# Patient Record
Sex: Female | Born: 1935 | Race: White | Hispanic: No | Smoking: Former smoker
Health system: Southern US, Community
[De-identification: ages and names within clinical notes are randomized; demographics above are authoritative.]

## PROBLEM LIST (undated history)

## (undated) DIAGNOSIS — I2510 Atherosclerotic heart disease of native coronary artery without angina pectoris: Secondary | ICD-10-CM

## (undated) DIAGNOSIS — E119 Type 2 diabetes mellitus without complications: Secondary | ICD-10-CM

## (undated) DIAGNOSIS — I10 Essential (primary) hypertension: Secondary | ICD-10-CM

## (undated) DIAGNOSIS — I4891 Unspecified atrial fibrillation: Secondary | ICD-10-CM

## (undated) HISTORY — PX: OTHER SURGICAL HISTORY: SHX169

## (undated) HISTORY — PX: ABDOMINAL HYSTERECTOMY: SHX81

## (undated) HISTORY — PX: APPENDECTOMY: SHX54

---

## 2020-11-03 ENCOUNTER — Emergency Department: Payer: Medicare Other

## 2020-11-03 ENCOUNTER — Inpatient Hospital Stay
Admission: EM | Admit: 2020-11-03 | Discharge: 2020-11-05 | DRG: 309 | Disposition: A | Discharge status: Home / Self Care | Payer: Medicare Other | Attending: Internal Medicine | Admitting: Internal Medicine

## 2020-11-03 DIAGNOSIS — E785 Hyperlipidemia, unspecified: Secondary | ICD-10-CM

## 2020-11-03 DIAGNOSIS — Z79899 Other long term (current) drug therapy: Secondary | ICD-10-CM

## 2020-11-03 DIAGNOSIS — I2510 Atherosclerotic heart disease of native coronary artery without angina pectoris: Secondary | ICD-10-CM

## 2020-11-03 DIAGNOSIS — Z87891 Personal history of nicotine dependence: Secondary | ICD-10-CM

## 2020-11-03 DIAGNOSIS — Z23 Encounter for immunization: Secondary | ICD-10-CM

## 2020-11-03 DIAGNOSIS — I4891 Unspecified atrial fibrillation: Principal | ICD-10-CM | POA: Diagnosis present

## 2020-11-03 DIAGNOSIS — I480 Paroxysmal atrial fibrillation: Principal | ICD-10-CM | POA: Diagnosis present

## 2020-11-03 DIAGNOSIS — I10 Essential (primary) hypertension: Secondary | ICD-10-CM | POA: Diagnosis not present

## 2020-11-03 DIAGNOSIS — R8281 Pyuria: Secondary | ICD-10-CM | POA: Diagnosis present

## 2020-11-03 DIAGNOSIS — I959 Hypotension, unspecified: Secondary | ICD-10-CM | POA: Diagnosis not present

## 2020-11-03 DIAGNOSIS — E119 Type 2 diabetes mellitus without complications: Secondary | ICD-10-CM | POA: Diagnosis present

## 2020-11-03 DIAGNOSIS — Z20822 Contact with and (suspected) exposure to covid-19: Secondary | ICD-10-CM | POA: Diagnosis present

## 2020-11-03 DIAGNOSIS — E039 Hypothyroidism, unspecified: Secondary | ICD-10-CM | POA: Diagnosis present

## 2020-11-03 DIAGNOSIS — N390 Urinary tract infection, site not specified: Secondary | ICD-10-CM | POA: Diagnosis present

## 2020-11-03 DIAGNOSIS — E782 Mixed hyperlipidemia: Secondary | ICD-10-CM

## 2020-11-03 DIAGNOSIS — Z66 Do not resuscitate: Secondary | ICD-10-CM | POA: Diagnosis present

## 2020-11-03 DIAGNOSIS — Z87440 Personal history of urinary (tract) infections: Secondary | ICD-10-CM

## 2020-11-03 HISTORY — DX: Essential (primary) hypertension: I10

## 2020-11-03 HISTORY — DX: Type 2 diabetes mellitus without complications: E11.9

## 2020-11-03 HISTORY — DX: Atherosclerotic heart disease of native coronary artery without angina pectoris: I25.10

## 2020-11-03 HISTORY — DX: Unspecified atrial fibrillation: I48.91

## 2020-11-03 LAB — URINALYSIS, COMPLETE (UACMP) WITH MICROSCOPIC
Bilirubin Urine: NEGATIVE
Glucose, UA: NEGATIVE mg/dL
Hgb urine dipstick: NEGATIVE
Ketones, ur: NEGATIVE mg/dL
Nitrite: NEGATIVE
Protein, ur: NEGATIVE mg/dL
Specific Gravity, Urine: 1.011 (ref 1.005–1.030)
pH: 7 (ref 5.0–8.0)

## 2020-11-03 LAB — COMPREHENSIVE METABOLIC PANEL
ALT: 14 U/L (ref 0–44)
AST: 17 U/L (ref 15–41)
Albumin: 3.7 g/dL (ref 3.5–5.0)
Alkaline Phosphatase: 73 U/L (ref 38–126)
Anion gap: 10 (ref 5–15)
BUN: 25 mg/dL — ABNORMAL HIGH (ref 8–23)
CO2: 26 mmol/L (ref 22–32)
Calcium: 9.7 mg/dL (ref 8.9–10.3)
Chloride: 100 mmol/L (ref 98–111)
Creatinine, Ser: 0.97 mg/dL (ref 0.44–1.00)
GFR, Estimated: 55 mL/min — ABNORMAL LOW (ref >60)
Glucose, Bld: 102 mg/dL — ABNORMAL HIGH (ref 70–99)
Potassium: 3.8 mmol/L (ref 3.5–5.1)
Sodium: 136 mmol/L (ref 135–145)
Total Bilirubin: 1 mg/dL (ref 0.3–1.2)
Total Protein: 6.4 g/dL — ABNORMAL LOW (ref 6.5–8.1)

## 2020-11-03 LAB — CBC WITH DIFFERENTIAL/PLATELET
Abs Immature Granulocytes: 0.03 10*3/uL (ref 0.00–0.07)
Basophils Absolute: 0.1 10*3/uL (ref 0.0–0.1)
Basophils Relative: 1 %
Eosinophils Absolute: 0.2 10*3/uL (ref 0.0–0.5)
Eosinophils Relative: 2 %
HCT: 43.3 % (ref 36.0–46.0)
Hemoglobin: 14.1 g/dL (ref 12.0–15.0)
Immature Granulocytes: 0 %
Lymphocytes Relative: 25 %
Lymphs Abs: 2.6 10*3/uL (ref 0.7–4.0)
MCH: 31.8 pg (ref 26.0–34.0)
MCHC: 32.6 g/dL (ref 30.0–36.0)
MCV: 97.5 fL (ref 80.0–100.0)
Monocytes Absolute: 0.8 10*3/uL (ref 0.1–1.0)
Monocytes Relative: 7 %
Neutro Abs: 6.6 10*3/uL (ref 1.7–7.7)
Neutrophils Relative %: 65 %
Platelets: 202 10*3/uL (ref 150–400)
RBC: 4.44 MIL/uL (ref 3.87–5.11)
RDW: 12.5 % (ref 11.5–15.5)
WBC: 10.3 10*3/uL (ref 4.0–10.5)
nRBC: 0 % (ref 0.0–0.2)

## 2020-11-03 LAB — TROPONIN I (HIGH SENSITIVITY)
Troponin I (High Sensitivity): 8 ng/L (ref <18)
Troponin I (High Sensitivity): 7 ng/L (ref <18)

## 2020-11-03 LAB — RESP PANEL BY RT-PCR (FLU A&B, COVID) ARPGX2
Influenza A by PCR: NEGATIVE
Influenza B by PCR: NEGATIVE
SARS Coronavirus 2 by RT PCR: NEGATIVE

## 2020-11-03 LAB — TSH: TSH: 0.121 u[IU]/mL — ABNORMAL LOW (ref 0.350–4.500)

## 2020-11-03 LAB — PROCALCITONIN: Procalcitonin: <0.1 ng/mL

## 2020-11-03 LAB — BRAIN NATRIURETIC PEPTIDE: B Natriuretic Peptide: 341.8 pg/mL — ABNORMAL HIGH (ref 0.0–100.0)

## 2020-11-03 MED ORDER — ALBUTEROL SULFATE (2.5 MG/3ML) 0.083% IN NEBU: 2.5000 mg | INHALATION_SOLUTION | Freq: Four times a day (QID) | RESPIRATORY_TRACT | Status: DC | PRN

## 2020-11-03 MED ORDER — DILTIAZEM HCL-DEXTROSE 125-5 MG/125ML-% IV SOLN (PREMIX)
5.0000 mg/h | INTRAVENOUS | Status: DC
  Administered 2020-11-03 – 2020-11-04 (×2): 5 mg/h via INTRAVENOUS
  Administered 2020-11-04: 10 mg/h via INTRAVENOUS
  Filled 2020-11-03 (×2): qty 125

## 2020-11-03 MED ORDER — METOPROLOL TARTRATE 5 MG/5ML IV SOLN: 5.0000 mg | INTRAVENOUS | Status: DC | PRN

## 2020-11-03 MED ORDER — METOPROLOL TARTRATE 5 MG/5ML IV SOLN
5.0000 mg | Freq: Once | INTRAVENOUS | Status: AC
  Administered 2020-11-03: 5 mg via INTRAVENOUS
  Filled 2020-11-03: qty 5

## 2020-11-03 MED ORDER — METOPROLOL SUCCINATE ER 25 MG PO TB24
37.5000 mg | ORAL_TABLET | Freq: Two times a day (BID) | ORAL | Status: DC
  Administered 2020-11-03 – 2020-11-05 (×4): 37.5 mg via ORAL
  Filled 2020-11-03: qty 1.5
  Filled 2020-11-03: qty 2
  Filled 2020-11-03 (×2): qty 1.5
  Filled 2020-11-03: qty 2

## 2020-11-03 MED ORDER — LEVOTHYROXINE SODIUM 50 MCG PO TABS
150.0000 ug | ORAL_TABLET | Freq: Every day | ORAL | Status: DC
  Administered 2020-11-04: 150 ug via ORAL
  Filled 2020-11-03: qty 3

## 2020-11-03 MED ORDER — LEVOFLOXACIN IN D5W 750 MG/150ML IV SOLN
750.0000 mg | INTRAVENOUS | Status: DC
  Administered 2020-11-03: 750 mg via INTRAVENOUS
  Filled 2020-11-03: qty 150

## 2020-11-03 MED ORDER — ENALAPRIL MALEATE 10 MG PO TABS
10.0000 mg | ORAL_TABLET | Freq: Two times a day (BID) | ORAL | Status: DC
  Administered 2020-11-04 – 2020-11-05 (×3): 10 mg via ORAL
  Filled 2020-11-03 (×4): qty 1

## 2020-11-03 MED ORDER — ENOXAPARIN SODIUM 40 MG/0.4ML IJ SOSY
40.0000 mg | PREFILLED_SYRINGE | Freq: Every day | INTRAMUSCULAR | Status: DC
  Administered 2020-11-03 – 2020-11-04 (×2): 40 mg via SUBCUTANEOUS
  Filled 2020-11-03 (×2): qty 0.4

## 2020-11-03 MED ORDER — FLUTICASONE PROPIONATE 50 MCG/ACT NA SUSP
2.0000 | Freq: Every day | NASAL | Status: DC | PRN
  Filled 2020-11-03: qty 16

## 2020-11-03 MED ORDER — ONDANSETRON HCL 4 MG PO TABS
4.0000 mg | ORAL_TABLET | Freq: Four times a day (QID) | ORAL | Status: DC | PRN
Start: 2020-11-03 — End: 2020-11-05

## 2020-11-03 MED ORDER — ACETAMINOPHEN 650 MG RE SUPP: 650.0000 mg | Freq: Four times a day (QID) | RECTAL | Status: DC | PRN

## 2020-11-03 MED ORDER — ADULT MULTIVITAMIN W/MINERALS CH
1.0000 | ORAL_TABLET | Freq: Every day | ORAL | Status: DC
  Administered 2020-11-04 – 2020-11-05 (×2): 1 via ORAL
  Filled 2020-11-03 (×2): qty 1

## 2020-11-03 MED ORDER — ACETAMINOPHEN 325 MG PO TABS: 650.0000 mg | ORAL_TABLET | Freq: Four times a day (QID) | ORAL | Status: DC | PRN

## 2020-11-03 MED ORDER — ATORVASTATIN CALCIUM 10 MG PO TABS
10.0000 mg | ORAL_TABLET | Freq: Every day | ORAL | Status: DC
  Administered 2020-11-03 – 2020-11-04 (×2): 10 mg via ORAL
  Filled 2020-11-03 (×2): qty 1

## 2020-11-03 MED ORDER — ONDANSETRON HCL 4 MG/2ML IJ SOLN: 4.0000 mg | Freq: Four times a day (QID) | INTRAMUSCULAR | Status: DC | PRN

## 2020-11-03 MED ORDER — ASPIRIN EC 81 MG PO TBEC
81.0000 mg | DELAYED_RELEASE_TABLET | Freq: Every day | ORAL | Status: DC
  Administered 2020-11-03 – 2020-11-05 (×3): 81 mg via ORAL
  Filled 2020-11-03 (×3): qty 1

## 2020-11-03 NOTE — H&P (Addendum)
History and Physical   Jane Campbell OEV:[id number] DOB: 07-29-1926 DOA: 11/03/2020  PCP: Pcp, No  Patient coming from: [facility]  I have personally briefly reviewed patient's old medical records in [facility] EMR.  Chief Concern: Increase in her heart rate  HPI: Jane Campbell is a 87 y.o. female with medical history significant for atrial fibrillation, hypertension, history of CAD, history of frequent UTI previously on chronic suppressive therapy, history of diabetes, who presents to the emergency department for chief concerns of elevated heart rate.  She reports that she has noticed the elevated heart rate in the last several days.  She does endorse dizziness and lightheadedness when standing. She reports that either on 10/29/2020 or 10/30/2020, she developed nausea and had 1 episode of vomiting.  She denies vomiting blood or coffee-ground substances.  She denies diarrhea.  She states that this did not recur again.  She denies any dysuria, hematuria, urgency, syncope, loss of consciousness, chest pain, shortness of breath, abdominal pain, dysphagia.  She states that she has had issues with frequent UTI before.  She states the last time she used antibiotic was in the spring prior to coming to [place].  She states that formally she was on chronic suppressive therapy prescribed by her OB/GYN however her health insurance company refused to continue to pay the chronic suppressive therapy.  Chronic suppressive therapy was not an option due to health insurance refusal.  She states that she does not know what reaction she has with penicillin, and she notes that she has taken ciprofloxacin before.  At bedside, she is able to tell me her name, age, current location, and current years.  Her mental age appears to be younger than her chronological age.  She moved from [place] from [place] area to be closer to her daugther. She is a widow. She expresses wish that she would like sign a DNR.    Social history: She lives at [facility]. She is retired and formerly was an [employer] fundraiser. She denies current tobacco use, quitting in 1964. She infrequently drinks etoh. She denies recreational drugs.   Vaccination history: She is vaccinated for covid 19, with two additional boosters Engineer, maintenance (IT).  ROS: Constitutional: no weight change, no fever ENT/Mouth: no sore throat, no rhinorrhea Eyes: no eye pain, no vision changes Cardiovascular: no chest pain, no dyspnea,  no edema, no palpitations Respiratory: no cough, no sputum, no wheezing Gastrointestinal: no nausea, no vomiting, no diarrhea, no constipation Genitourinary: no urinary incontinence, no dysuria, no hematuria Musculoskeletal: no arthralgias, no myalgias Skin: no skin lesions, no pruritus, Neuro: + weakness, no loss of consciousness, no syncope Psych: no anxiety, no depression, no decrease appetite Heme/Lymph: no bruising, no bleeding  ED Course: Discussed with emergency medicine provider, patient requiring hospitalization for chief concerns of atrial fibrillation with RVR.  Vitals the emergency department was remarkable for temperature 97.6, respiration rate of 15, heart rate of 95, increased to 156, blood pressure 129/79, SPO2 of 97% on room air.  In the emergency department patient was given 1 dose of metoprolol tartrate 5 mg IV, started on diltiazem gtt.  Assessment/Plan  Principal Problem:   Atrial fibrillation with RVR (HCC) Active Problems:   Essential hypertension   Pyuria   Hyperlipidemia   CAD (coronary artery disease)   # Atrial fibrillation with RVR - Etiology work-up in progress, presumed secondary to urinary tract infection as patient is no longer on suppressive antibiotic therapy for frequent urinary tract infection due to health  insurance denial - Low clinical suspicion for ACS at this time as high-sensitivity troponin was 8 and then decreased to 7 and at bedside patient continues to deny chest pain and  or shortness of breath - On physical exam, patient's abdomen was soft, nontender, I did not appreciate any mass and she did not exhibit any acute discomfort - I have low clinical suspicion for GI pathology at this time - Her chest x-ray did not show any overt x-ray evidence of cardiopulmonary pathology at this time on my view and per radiology read - Procalcitonin ordered - UA was remarkable for leukocytes and is the only infectious etiology ICU at this time, we will initiate antibiotic - Check TSH - Continue Cardizem gtt. - Metoprolol tartrate 5 mg IV every hour as needed for heart rate greater than 120, 3 doses ordered - Admit to progressive cardiac, observation, telemetry  # Pyuria/UTI-levofloxacin 750 mg IV, 5 days ordered  # History of hypertension-enalapril 10 mg twice daily, metoprolol succinate 37.5 mg p.o. twice daily - Patient missed her a.m. dose prior to ED presentation  # Hyperlipidemia-atorvastatin 10 mg daily  # CAD-aspirin, Lipitor  Chart reviewed.   DVT prophylaxis: Enoxaparin 40 mg subcutaneous daily Code Status: DNR/DNR  Diet: Heart healthy Family Communication: No Disposition Plan: Pending clinical course Consults called: None at this time Admission status: Observation, progressive cardiac, telemetry  Past Medical History:  Diagnosis Date   A-fib (HCC)    Coronary artery disease    Diabetes mellitus without complication (HCC)    Hypertension    Past Surgical History:  Procedure Laterality Date   ABDOMINAL HYSTERECTOMY     APPENDECTOMY     Left kidney Removed     Social History:  reports that she quit smoking about 58 years ago. Her smoking use included cigarettes. She has quit using smokeless tobacco. She reports that she does not currently use alcohol. She reports that she does not use drugs.  Allergies  Allergen Reactions   Penicillins    History reviewed. No pertinent family history. Family history: Family history reviewed and not  pertinent  Prior to Admission medications   Aspirin 81 mg, atorvastatin 10 mg daily   Physical Exam: Vitals:   11/03/20 1630 11/03/20 1700 11/03/20 1730 11/03/20 1800  BP: 112/73 106/64 101/61 96/62  Pulse: (!) 138 (!) 59 86 98  Resp: (!) 22 18 20 20   Temp:      TempSrc:      SpO2: 96% 97% 96% 95%  Weight:      Height:       Constitutional: appears younger than chronological age, frail, NAD, calm, comfortable Eyes: PERRL, lids and conjunctivae normal ENMT: Mucous membranes are moist. Posterior pharynx clear of any exudate or lesions. Age-appropriate dentition. Hearing appropriate Neck: normal, supple, no masses, no thyromegaly Respiratory: clear to auscultation bilaterally, no wheezing, no crackles. Normal respiratory effort. No accessory muscle use.  Cardiovascular: Regular rate and rhythm, no murmurs / rubs / gallops. No carotid bruits. Bilateral lower extremity swelling + Abdomen: no tenderness, no masses palpated, no hepatosplenomegaly. Bowel sounds positive.  Musculoskeletal: no clubbing / cyanosis. No joint deformity upper and lower extremities. Good ROM, no contractures, no atrophy. Normal muscle tone.  Skin: no rashes, lesions, ulcers. No induration Neurologic: Sensation intact. Strength 5/5 in all 4.  Psychiatric: Normal judgment and insight. Alert and oriented x 3. Normal mood.   EKG: independently reviewed, showing atrial fibrillation with RVR, 144, QTc 471  Chest x-ray on Admission: I personally  reviewed and I agree with radiologist reading as below.  US Venous Img Lower Bilateral  Result Date: 11/03/2020 CLINICAL DATA:  87 year old female with leg swelling EXAM: BILATERAL LOWER EXTREMITY VENOUS DOPPLER ULTRASOUND TECHNIQUE: Gray-scale sonography with graded compression, as well as color Doppler and duplex ultrasound were performed to evaluate the lower extremity deep venous systems from the level of the common femoral vein and including the common femoral, femoral,  profunda femoral, popliteal and calf veins including the posterior tibial, peroneal and gastrocnemius veins when visible. The superficial great saphenous vein was also interrogated. Spectral Doppler was utilized to evaluate flow at rest and with distal augmentation maneuvers in the common femoral, femoral and popliteal veins. COMPARISON:  None. FINDINGS: RIGHT LOWER EXTREMITY Common Femoral Vein: No evidence of thrombus. Normal compressibility, respiratory phasicity and response to augmentation. Saphenofemoral Junction: No evidence of thrombus. Normal compressibility and flow on color Doppler imaging. Profunda Femoral Vein: No evidence of thrombus. Normal compressibility and flow on color Doppler imaging. Femoral Vein: No evidence of thrombus. Normal compressibility, respiratory phasicity and response to augmentation. Popliteal Vein: No evidence of thrombus. Normal compressibility, respiratory phasicity and response to augmentation. Calf Veins: No evidence of thrombus. Normal compressibility and flow on color Doppler imaging. Superficial Great Saphenous Vein: No evidence of thrombus. Normal compressibility and flow on color Doppler imaging. Other Findings: Anechoic fluid collection in the popliteal fossa measuring 4.4 cm x 0.9 cm x 3.4 cm LEFT LOWER EXTREMITY Common Femoral Vein: No evidence of thrombus. Normal compressibility, respiratory phasicity and response to augmentation. Saphenofemoral Junction: No evidence of thrombus. Normal compressibility and flow on color Doppler imaging. Profunda Femoral Vein: No evidence of thrombus. Normal compressibility and flow on color Doppler imaging. Femoral Vein: No evidence of thrombus. Normal compressibility, respiratory phasicity and response to augmentation. Popliteal Vein: No evidence of thrombus. Normal compressibility, respiratory phasicity and response to augmentation. Calf Veins: No evidence of thrombus. Normal compressibility and flow on color Doppler imaging.  Superficial Great Saphenous Vein: No evidence of thrombus. Normal compressibility and flow on color Doppler imaging. Other Findings: Anechoic fluid collection in the popliteal fossa measuring 7.7 cm x 2.9 cm x 1.3 cm. IMPRESSION: Sonographic survey of the bilateral lower extremities negative for DVT. Bilateral Baker's cyst Electronically Signed   By: Gilmer Mor D.O.   On: 11/03/2020 15:40   DG Chest Portable 1 View  Result Date: 11/03/2020 CLINICAL DATA:  Rapid AFib EXAM: PORTABLE CHEST 1 VIEW COMPARISON:  None. FINDINGS: Cardiac contours within normal limits for AP technique. Rounded opacity with central lucency overlying the mediastinum, likely a moderate hiatal hernia. Mild scattered linear opacities, likely atelectasis. No focal consolidation. No large pleural effusion or evidence of pneumothorax. Moderate scoliosis. IMPRESSION: No focal consolidation. Rounded opacity with central lucency overlying the mediastinum, likely a moderate hiatal hernia. Electronically Signed   By: Allegra Lai M.D.   On: 11/03/2020 14:17    Labs on Admission: I have personally reviewed following labs  CBC: Recent Labs  Lab 11/03/20 1332  WBC 10.3  NEUTROABS 6.6  HGB 14.1  HCT 43.3  MCV 97.5  PLT 202   Basic Metabolic Panel: Recent Labs  Lab 11/03/20 1332  NA 136  K 3.8  CL 100  CO2 26  GLUCOSE 102*  BUN 25*  CREATININE 0.97  CALCIUM 9.7   GFR: Estimated Creatinine Clearance: 36.6 mL/min (by C-G formula based on SCr of 0.97 mg/dL).  Liver Function Tests: Recent Labs  Lab 11/03/20 1332  AST 17  ALT 14  ALKPHOS 73  BILITOT 1.0  PROT 6.4*  ALBUMIN 3.7   Urine analysis:    Component Value Date/Time   COLORURINE YELLOW (A) 11/03/2020 1317   APPEARANCEUR HAZY (A) 11/03/2020 1317   LABSPEC 1.011 11/03/2020 1317   PHURINE 7.0 11/03/2020 1317   GLUCOSEU NEGATIVE 11/03/2020 1317   HGBUR NEGATIVE 11/03/2020 1317   BILIRUBINUR NEGATIVE 11/03/2020 1317   KETONESUR NEGATIVE 11/03/2020  1317   PROTEINUR NEGATIVE 11/03/2020 1317   NITRITE NEGATIVE 11/03/2020 1317   LEUKOCYTESUR SMALL (A) 11/03/2020 1317   Dr. Sedalia Muta [facility]  If 7PM-7AM, please contact overnight-coverage provider If 7AM-7PM, please contact day coverage provider [email or web address]  11/03/2020, 7:11 PM

## 2020-11-03 NOTE — ED Notes (Signed)
Family at bedside.  Pt comfortable.

## 2020-11-03 NOTE — ED Notes (Signed)
Provider at bedside

## 2020-11-03 NOTE — ED Notes (Signed)
ED Provider at bedside. 

## 2020-11-03 NOTE — ED Notes (Signed)
Daughter at bedside.  Pt resting comfortably.

## 2020-11-03 NOTE — ED Triage Notes (Signed)
Pt has a history of AFIB was found to have an higher rate than normal.  Per EMS HR 150's.  Pt alert and oriented, hard of hearing, skin warm and dry.

## 2020-11-03 NOTE — ED Provider Notes (Signed)
[facility] Emergency Department Provider Note ____________________________________________   Event Date/Time   First MD Initiated Contact with Patient 11/03/20 1311     (approximate)  I have reviewed the triage vital signs and the nursing notes.   HISTORY  Chief Complaint Atrial Fibrillation    HPI Aneyah Lortz is a 87 y.o. female with PMH as noted below including atrial fibrillation, CAD, diabetes who presents with elevated heart rate.  She has been getting an extra dose of metoprolol for the last several days.  Today, the patient states she started to feel dizzy when getting up, which she describes as lightheadedness.  She denies any significant shortness of breath or chest pain.  She has some leg swelling which has gotten worse over the last few weeks.  Past Medical History:  Diagnosis Date   A-fib [facility])    Coronary artery disease    Diabetes mellitus without complication (HCC)    Hypertension     There are no problems to display for this patient.    Prior to Admission medications   Not on File    Allergies Penicillins  No family history on file.  Social History Social History   Tobacco Use   Smoking status: Former    Types: Cigarettes    Quit date: 02/07/1962    Years since quitting: 58.7   Smokeless tobacco: Former  Substance Use Topics   Alcohol use: Not Currently   Drug use: Not Currently    Review of Systems  Constitutional: No fever/chills Eyes: No visual changes. ENT: No sore throat. Cardiovascular: Denies chest pain. Respiratory: Denies shortness of breath. Gastrointestinal: No vomiting or diarrhea.  Genitourinary: Negative for dysuria.  Musculoskeletal: Negative for back pain. Skin: Negative for rash. Neurological: Negative for headache.   ____________________________________________   PHYSICAL EXAM:  VITAL SIGNS: ED Triage Vitals  Enc Vitals Group     BP 11/03/20 1316 (!) 144/125     Pulse Rate 11/03/20  1316 (!) 140     Resp 11/03/20 1316 18     Temp 11/03/20 1316 97.6 F (36.4 C)     Temp Source 11/03/20 1316 Oral     SpO2 11/03/20 1316 98 %     Weight 11/03/20 1314 165 lb (74.8 kg)     Height 11/03/20 1314 5\' 3"  (1.6 m)     Head Circumference --      Peak Flow --      Pain Score 11/03/20 1313 0     Pain Loc --      Pain Edu? --      Excl. in GC? --     Constitutional: Alert and oriented. Well appearing for age and in no acute distress. Eyes: Conjunctivae are normal.  Head: Atraumatic. Nose: No congestion/rhinnorhea. Mouth/Throat: Mucous membranes are moist.   Neck: Normal range of motion.  Cardiovascular: Tachycardic, irregular rhythm. Grossly normal heart sounds.  Good peripheral circulation. Respiratory: Normal respiratory effort.  No retractions. Lungs CTAB. Gastrointestinal: No distention.  Musculoskeletal: 2+ bilateral lower extremity edema.  Extremities warm and well perfused.  Neurologic:  Normal speech and language. No gross focal neurologic deficits are appreciated.  Skin:  Skin is warm and dry. No rash noted. Psychiatric: Mood and affect are normal. Speech and behavior are normal.  ____________________________________________   LABS (all labs ordered are listed, but only abnormal results are displayed)  Labs Reviewed  COMPREHENSIVE METABOLIC PANEL - Abnormal; Notable for the following components:      Result Value  Glucose, Bld 102 (*)    BUN 25 (*)    Total Protein 6.4 (*)    GFR, Estimated 55 (*)    All other components within normal limits  BRAIN NATRIURETIC PEPTIDE - Abnormal; Notable for the following components:   B Natriuretic Peptide 341.8 (*)    All other components within normal limits  URINALYSIS, COMPLETE (UACMP) WITH MICROSCOPIC - Abnormal; Notable for the following components:   Color, Urine YELLOW (*)    APPearance HAZY (*)    Leukocytes,Ua SMALL (*)    Bacteria, UA RARE (*)    All other components within normal limits  RESP PANEL BY  RT-PCR (FLU A&B, COVID) ARPGX2  CBC WITH DIFFERENTIAL/PLATELET  TROPONIN I (HIGH SENSITIVITY)  TROPONIN I (HIGH SENSITIVITY)   ____________________________________________  EKG  ED ECG REPORT I, Dionne Bucy, the attending physician, personally viewed and interpreted this ECG.  Date: 11/03/2020 EKG Time: 1323 Rate: 110 Rhythm: Atrial fibrillation with RVR QRS Axis: normal Intervals: normal ST/T Wave abnormalities: Nonspecific abnormality Narrative Interpretation: Atrial fibrillation with no evidence of acute ischemia  ____________________________________________  RADIOLOGY  Chest x-ray interpreted by me shows no focal consolidation or edema  ____________________________________________   PROCEDURES  Procedure(s) performed: No  Procedures  Critical Care performed: No ____________________________________________   INITIAL IMPRESSION / ASSESSMENT AND PLAN / ED COURSE  Pertinent labs & imaging results that were available during my care of the patient were reviewed by me and considered in my medical decision making (see chart for details).   87 year old female with PMH as noted above including atrial fibrillation CAD, diabetes, and hypertension presents with elevated heart rate to the 140s today along with dizziness and an episode of near syncope when she stood up.  Per EMS, the patient has been receiving extra doses of metoprolol over the last several days.  I reviewed the past medical records in Epic, however the [place] and is not yet established with medical care.  She has an initial appointment with Dr. [name] from cardiology scheduled for next week.  On exam she is overall well-appearing for her age.  Her vital signs are normal except for tachycardia to the 130s.  EKG shows atrial fibrillation.  She has significant bilateral peripheral edema which she says has worsened recently and had started asymmetrically.  Lungs are  clear to auscultation.  The presentation is consistent with rapid atrial fibrillation although the etiology of this exacerbation is unclear.  We will give IV metoprolol, obtain a chest x-ray and lab work-up to evaluate for acute CHF and for precipitating causes such as UTI.  ----------------------------------------- 3:43 PM on 11/03/2020 -----------------------------------------  There is no evidence of acute CHF.  The patient's heart rate improved with metoprolol but has now gone back to the 140s.  We will start her on Cardizem infusion.  I consulted Dr. Sedalia Muta from the hospitalist service for admission.  ____________________________________________   FINAL CLINICAL IMPRESSION(S) / ED DIAGNOSES  Final diagnoses:  Atrial fibrillation with RVR (HCC)      NEW MEDICATIONS STARTED DURING THIS VISIT:  New Prescriptions   No medications on file     Note:  This document was prepared using Dragon voice recognition software and may include unintentional dictation errors.    Dionne Bucy, MD 11/03/20 1544

## 2020-11-03 NOTE — ED Notes (Signed)
Pt cleansed and diaper applied,  purwick in place

## 2020-11-04 ENCOUNTER — Other Ambulatory Visit: Payer: Self-pay

## 2020-11-04 ENCOUNTER — Inpatient Hospital Stay
Admit: 2020-11-04 | Discharge: 2020-11-04 | Disposition: A | Discharge status: Home / Self Care | Payer: Medicare Other | Attending: Internal Medicine | Admitting: Internal Medicine

## 2020-11-04 DIAGNOSIS — I4891 Unspecified atrial fibrillation: Secondary | ICD-10-CM | POA: Diagnosis present

## 2020-11-04 DIAGNOSIS — Z66 Do not resuscitate: Secondary | ICD-10-CM | POA: Diagnosis present

## 2020-11-04 DIAGNOSIS — Z87891 Personal history of nicotine dependence: Secondary | ICD-10-CM | POA: Diagnosis not present

## 2020-11-04 DIAGNOSIS — E785 Hyperlipidemia, unspecified: Secondary | ICD-10-CM | POA: Diagnosis present

## 2020-11-04 DIAGNOSIS — R8281 Pyuria: Secondary | ICD-10-CM | POA: Diagnosis not present

## 2020-11-04 DIAGNOSIS — I959 Hypotension, unspecified: Secondary | ICD-10-CM | POA: Diagnosis not present

## 2020-11-04 DIAGNOSIS — I480 Paroxysmal atrial fibrillation: Secondary | ICD-10-CM | POA: Diagnosis present

## 2020-11-04 DIAGNOSIS — Z20822 Contact with and (suspected) exposure to covid-19: Secondary | ICD-10-CM | POA: Diagnosis present

## 2020-11-04 DIAGNOSIS — Z87440 Personal history of urinary (tract) infections: Secondary | ICD-10-CM | POA: Diagnosis not present

## 2020-11-04 DIAGNOSIS — I2510 Atherosclerotic heart disease of native coronary artery without angina pectoris: Secondary | ICD-10-CM | POA: Diagnosis present

## 2020-11-04 DIAGNOSIS — Z23 Encounter for immunization: Secondary | ICD-10-CM | POA: Diagnosis present

## 2020-11-04 DIAGNOSIS — Z79899 Other long term (current) drug therapy: Secondary | ICD-10-CM | POA: Diagnosis not present

## 2020-11-04 DIAGNOSIS — E119 Type 2 diabetes mellitus without complications: Secondary | ICD-10-CM | POA: Diagnosis present

## 2020-11-04 DIAGNOSIS — E039 Hypothyroidism, unspecified: Secondary | ICD-10-CM

## 2020-11-04 DIAGNOSIS — N390 Urinary tract infection, site not specified: Secondary | ICD-10-CM | POA: Diagnosis present

## 2020-11-04 DIAGNOSIS — I10 Essential (primary) hypertension: Secondary | ICD-10-CM | POA: Diagnosis present

## 2020-11-04 LAB — BASIC METABOLIC PANEL
Anion gap: 8 (ref 5–15)
BUN: 25 mg/dL — ABNORMAL HIGH (ref 8–23)
CO2: 26 mmol/L (ref 22–32)
Calcium: 9.2 mg/dL (ref 8.9–10.3)
Chloride: 99 mmol/L (ref 98–111)
Creatinine, Ser: 1.01 mg/dL — ABNORMAL HIGH (ref 0.44–1.00)
GFR, Estimated: 53 mL/min — ABNORMAL LOW (ref >60)
Glucose, Bld: 123 mg/dL — ABNORMAL HIGH (ref 70–99)
Potassium: 3.9 mmol/L (ref 3.5–5.1)
Sodium: 133 mmol/L — ABNORMAL LOW (ref 135–145)

## 2020-11-04 LAB — CBC
HCT: 36.7 % (ref 36.0–46.0)
Hemoglobin: 12.5 g/dL (ref 12.0–15.0)
MCH: 33.2 pg (ref 26.0–34.0)
MCHC: 34.1 g/dL (ref 30.0–36.0)
MCV: 97.3 fL (ref 80.0–100.0)
Platelets: 171 10*3/uL (ref 150–400)
RBC: 3.77 MIL/uL — ABNORMAL LOW (ref 3.87–5.11)
RDW: 12.8 % (ref 11.5–15.5)
WBC: 9 10*3/uL (ref 4.0–10.5)
nRBC: 0 % (ref 0.0–0.2)

## 2020-11-04 MED ORDER — INFLUENZA VAC A&B SA ADJ QUAD 0.5 ML IM PRSY
0.5000 mL | PREFILLED_SYRINGE | INTRAMUSCULAR | Status: AC
  Administered 2020-11-05: 0.5 mL via INTRAMUSCULAR
  Filled 2020-11-04 (×2): qty 0.5

## 2020-11-04 MED ORDER — LEVOTHYROXINE SODIUM 25 MCG PO TABS
125.0000 ug | ORAL_TABLET | Freq: Every day | ORAL | Status: DC
  Administered 2020-11-05: 125 ug via ORAL
  Filled 2020-11-04: qty 1

## 2020-11-04 MED ORDER — DILTIAZEM HCL 30 MG PO TABS
30.0000 mg | ORAL_TABLET | Freq: Three times a day (TID) | ORAL | Status: DC
  Administered 2020-11-04 – 2020-11-05 (×3): 30 mg via ORAL
  Filled 2020-11-04 (×3): qty 1

## 2020-11-04 MED ORDER — LEVOFLOXACIN 500 MG PO TABS: 750.0000 mg | ORAL_TABLET | ORAL | Status: DC

## 2020-11-04 NOTE — ED Notes (Signed)
Patient placed in hospital bed at this time.

## 2020-11-04 NOTE — ED Notes (Signed)
Secure chat to Sherryll Burger, MD: "Hello - pts HR going into high 30s and low 40s. Asymptomatic. Pt is on cardizem 10 mL/hr. Paused cardizem at this time. Please let me know what we can do" MD advised to pause for now and will come evaluate pt

## 2020-11-04 NOTE — Consult Note (Signed)
[facility] Cardiology  CARDIOLOGY CONSULT NOTE  Patient ID: Jane Campbell MRN: [id number] DOB/AGE: 09-18-1926 87 y.o.  Admit date: 11/03/2020 Referring Physician Sherryll Burger Primary Physician  Primary Cardiologist  Reason for Consultation atrial fibrillation with rapid ventricular rate.  HPI: The patient is a 87 year old female referred for atrial fibrillation with rapid ventricular rate.  The patient recently moved to [place] from [place] 08/11/2020 to live near her daughter.  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  She has a history of paroxysmal atrial fibrillation nearly 20 years.  She is not anticoagulated.  She has noticed elevated heart rate during the past several days, without chest pain, shortness of breath or palpitations.  She presented to [facility] emergency room 11/03/2020 at which time she was noted to be tachycardic, ECG revealed atrial fibrillation at a rate of 144 bpm.  Patient was treated with diltiazem bolus and drip with decrease in heart rate.  Telemetry currently reveals atrial fibrillation at a rate of 71 bpm.  Also of note, the patient has a history of recurrent urinary tract infections.  Urine analysis is consistent with UTI and the patient is being treated with IV Levaquin.  Review of systems complete and found to be negative unless listed above     Past Medical History:  Diagnosis Date   A-fib (HCC)    Coronary artery disease    Diabetes mellitus without complication (HCC)    Hypertension     Past Surgical History:  Procedure Laterality Date   ABDOMINAL HYSTERECTOMY     APPENDECTOMY     Left kidney Removed      (Not in a hospital admission)  Social History   Socioeconomic History   Marital status: Widowed    Spouse name: Not on file   Number of children: Not on file   Years of education: Not on file   Highest education level: Not on file  Occupational History   Not on file  Tobacco Use   Smoking status: Former    Types: Cigarettes    Quit date:  02/07/1962    Years since quitting: 58.7   Smokeless tobacco: Former  Substance and Sexual Activity   Alcohol use: Not Currently   Drug use: Never   Sexual activity: Not Currently  Other Topics Concern   Not on file  Social History Narrative   Not on file   Social Determinants of Health   Financial Resource Strain: Not on file  Food Insecurity: Not on file  Transportation Needs: Not on file  Physical Activity: Not on file  Stress: Not on file  Social Connections: Not on file  Intimate Partner Violence: Not on file    History reviewed. No pertinent family history.    Review of systems complete and found to be negative unless listed above      PHYSICAL EXAM  General: Well developed, well nourished, in no acute distress HEENT:  Normocephalic and atramatic Neck:  No JVD.  Lungs: Clear bilaterally to auscultation and percussion. Heart: HRRR . Normal S1 and S2 without gallops or murmurs.  Abdomen: Bowel sounds are positive, abdomen soft and non-tender  Msk:  Back normal, normal gait. Normal strength and tone for age. Extremities: No clubbing, cyanosis or edema.   Neuro: Alert and oriented X 3. Psych:  Good affect, responds appropriately  Labs:   Lab Results  Component Value Date   WBC 9.0 11/04/2020   HGB 12.5 11/04/2020   HCT 36.7 11/04/2020   MCV 97.3  11/04/2020   PLT 171 11/04/2020    Recent Labs  Lab 11/03/20 1332 11/04/20 0623  NA 136 133*  K 3.8 3.9  CL 100 99  CO2 26 26  BUN 25* 25*  CREATININE 0.97 1.01*  CALCIUM 9.7 9.2  PROT 6.4*  --   BILITOT 1.0  --   ALKPHOS 73  --   ALT 14  --   AST 17  --   GLUCOSE 102* 123*   No results found for: CKTOTAL, CKMB, CKMBINDEX, TROPONINI No results found for: CHOL No results found for: HDL No results found for: LDLCALC No results found for: TRIG No results found for: CHOLHDL No results found for: LDLDIRECT    Radiology: US Venous Img Lower Bilateral  Result Date: 11/03/2020 CLINICAL DATA:  87 year old  female with leg swelling EXAM: BILATERAL LOWER EXTREMITY VENOUS DOPPLER ULTRASOUND TECHNIQUE: Gray-scale sonography with graded compression, as well as color Doppler and duplex ultrasound were performed to evaluate the lower extremity deep venous systems from the level of the common femoral vein and including the common femoral, femoral, profunda femoral, popliteal and calf veins including the posterior tibial, peroneal and gastrocnemius veins when visible. The superficial great saphenous vein was also interrogated. Spectral Doppler was utilized to evaluate flow at rest and with distal augmentation maneuvers in the common femoral, femoral and popliteal veins. COMPARISON:  None. FINDINGS: RIGHT LOWER EXTREMITY Common Femoral Vein: No evidence of thrombus. Normal compressibility, respiratory phasicity and response to augmentation. Saphenofemoral Junction: No evidence of thrombus. Normal compressibility and flow on color Doppler imaging. Profunda Femoral Vein: No evidence of thrombus. Normal compressibility and flow on color Doppler imaging. Femoral Vein: No evidence of thrombus. Normal compressibility, respiratory phasicity and response to augmentation. Popliteal Vein: No evidence of thrombus. Normal compressibility, respiratory phasicity and response to augmentation. Calf Veins: No evidence of thrombus. Normal compressibility and flow on color Doppler imaging. Superficial Great Saphenous Vein: No evidence of thrombus. Normal compressibility and flow on color Doppler imaging. Other Findings: Anechoic fluid collection in the popliteal fossa measuring 4.4 cm x 0.9 cm x 3.4 cm LEFT LOWER EXTREMITY Common Femoral Vein: No evidence of thrombus. Normal compressibility, respiratory phasicity and response to augmentation. Saphenofemoral Junction: No evidence of thrombus. Normal compressibility and flow on color Doppler imaging. Profunda Femoral Vein: No evidence of thrombus. Normal compressibility and flow on color Doppler  imaging. Femoral Vein: No evidence of thrombus. Normal compressibility, respiratory phasicity and response to augmentation. Popliteal Vein: No evidence of thrombus. Normal compressibility, respiratory phasicity and response to augmentation. Calf Veins: No evidence of thrombus. Normal compressibility and flow on color Doppler imaging. Superficial Great Saphenous Vein: No evidence of thrombus. Normal compressibility and flow on color Doppler imaging. Other Findings: Anechoic fluid collection in the popliteal fossa measuring 7.7 cm x 2.9 cm x 1.3 cm. IMPRESSION: Sonographic survey of the bilateral lower extremities negative for DVT. Bilateral Baker's cyst Electronically Signed   By: Gilmer Mor D.O.   On: 11/03/2020 15:40   DG Chest Portable 1 View  Result Date: 11/03/2020 CLINICAL DATA:  Rapid AFib EXAM: PORTABLE CHEST 1 VIEW COMPARISON:  None. FINDINGS: Cardiac contours within normal limits for AP technique. Rounded opacity with central lucency overlying the mediastinum, likely a moderate hiatal hernia. Mild scattered linear opacities, likely atelectasis. No focal consolidation. No large pleural effusion or evidence of pneumothorax. Moderate scoliosis. IMPRESSION: No focal consolidation. Rounded opacity with central lucency overlying the mediastinum, likely a moderate hiatal hernia. Electronically Signed   By: Tacey Ruiz  Strickland M.D.   On: 11/03/2020 14:17    EKG: Atrial fibrillation at a rate of 144 bpm  ASSESSMENT AND PLAN:   1.  Atrial fibrillation with rapid ventricular rate, likely exacerbated by underlying urinary tract infection.  Patient has a 20-year history of atrial fibrillation, currently on metoprolol succinate for rate control, not anticoagulated.  Heart rate improved after diltiazem bolus and drip. 2.  Essential hypertension, on enalapril and metoprolol succinate 3.  Recurrent UTIs, on Levaquin IV 4.  Hyperlipidemia, on atorvastatin  Recommendations  1.  Agree with overall current  therapy 2.  Resume home metoprolol succinate 37.5 mg p.o. twice daily 3.  Continue diltiazem drip, likely will transition to p.o. diltiazem later today 4.  Review 2D echocardiogram 5.  Briefly discuss chronic anticoagulation with patient and patient's daughter.  Patient has CHA2DS2-VASc of 4, will likely benefit from chronic anticoagulation, which can be initiated as outpatient. 5.  Further recommendations pending 2D echocardiogram results   Signed: Marcina Millard MD,PhD, [facility] 11/04/2020, 1:39 PM

## 2020-11-04 NOTE — Hospital Course (Signed)
87 year old female with a known history of paroxysmal A. fib, hypertension, CAD, history of recurrent UTI, diabetes type 2 is admitted for A. fib with RVR  9/28: On Cardizem drip, cardiology consult

## 2020-11-04 NOTE — ED Notes (Signed)
Patient set up with lunch tray.

## 2020-11-04 NOTE — Progress Notes (Signed)
PHARMACIST - PHYSICIAN COMMUNICATION  DR:   Delfino Lovett  CONCERNING: IV to Oral Route Change Policy  RECOMMENDATION: This patient is receiving Levofloxacin by the intravenous route.  Based on criteria approved by the Pharmacy and Therapeutics Committee, the intravenous medication(s) is/are being converted to the equivalent oral dose form(s).   DESCRIPTION: These criteria include: The patient is eating (either orally or via tube) and/or has been taking other orally administered medications for a least 24 hours The patient has no evidence of active gastrointestinal bleeding or impaired GI absorption (gastrectomy, short bowel, patient on TNA or NPO).  If you have questions about this conversion, please contact the Pharmacy Department  []   [phone] )  ( [phone] [x]   [phone] )  [facility] []   [phone] )  [facility] []   [phone] )  [facility] []   [phone] )  [facility]   Jane Campbell PharmD, BCPS 11/04/2020 11:08 AM

## 2020-11-04 NOTE — ED Notes (Signed)
Per MD Paraschos, ok tog ive metoprolol at this time

## 2020-11-04 NOTE — ED Notes (Signed)
Shah, MD at bedside

## 2020-11-04 NOTE — ED Notes (Signed)
Patient pulled up in bed.

## 2020-11-04 NOTE — ED Notes (Signed)
Patient brief changed and new pad placed. Patient cleaned with incontinence wipes. [place] leaked and replaced.

## 2020-11-04 NOTE — ED Notes (Signed)
Informed RN bed assigned 

## 2020-11-04 NOTE — Progress Notes (Signed)
[place] at [facility]   PATIENT NAME: Jane Campbell    MR#:  [id number]  PCP: Pcp, No  DATE OF BIRTH:  01-May-1926  SUBJECTIVE:  CHIEF COMPLAINT:   Chief Complaint  Patient presents with   Atrial Fibrillation  Feeling tired.  Heart rate fluctuating from 70s to 140s at rest.  Daughter at bedside REVIEW OF SYSTEMS:  Review of Systems  Constitutional:  Positive for malaise/fatigue. Negative for diaphoresis, fever and weight loss.  HENT:  Negative for ear discharge, ear pain, hearing loss, nosebleeds, sore throat and tinnitus.   Eyes:  Negative for blurred vision and pain.  Respiratory:  Negative for cough, hemoptysis, shortness of breath and wheezing.   Cardiovascular:  Negative for chest pain, palpitations, orthopnea and leg swelling.  Gastrointestinal:  Negative for abdominal pain, blood in stool, constipation, diarrhea, heartburn, nausea and vomiting.  Genitourinary:  Negative for dysuria, frequency and urgency.  Musculoskeletal:  Negative for back pain and myalgias.  Skin:  Negative for itching and rash.  Neurological:  Negative for dizziness, tingling, tremors, focal weakness, seizures, weakness and headaches.  Psychiatric/Behavioral:  Negative for depression. The patient is not nervous/anxious.   DRUG ALLERGIES:   Allergies  Allergen Reactions   Penicillins    VITALS:  Blood pressure 109/68, pulse 88, temperature 97.6 F (36.4 C), temperature source Oral, resp. rate 16, height 5\' 3"  (1.6 m), weight 68.6 kg, SpO2 95 %. PHYSICAL EXAMINATION:  Physical Exam 87 year old female lying in the bed comfortably without any acute distress Eyes pupil equal round reactive to light accommodation, no scleral icterus Lungs clear to auscultation bilaterally no wheezing rales rhonchi or crepitation Cardiovascular irregularly irregular heart sounds, no murmur rales or gallop Abdomen soft, benign Neuro alert and oriented, nonfocal Skin no rash or lesion LABORATORY PANEL:   Female CBC Recent Labs  Lab 11/04/20 0623  WBC 9.0  HGB 12.5  HCT 36.7  PLT 171   ------------------------------------------------------------------------------------------------------------------ Chemistries  Recent Labs  Lab 11/03/20 1332 11/04/20 0623  NA 136 133*  K 3.8 3.9  CL 100 99  CO2 26 26  GLUCOSE 102* 123*  BUN 25* 25*  CREATININE 0.97 1.01*  CALCIUM 9.7 9.2  AST 17  --   ALT 14  --   ALKPHOS 73  --   BILITOT 1.0  --    MEDICATIONS:  Scheduled Meds:  aspirin EC  81 mg Oral Daily   atorvastatin  10 mg Oral Daily   enalapril  10 mg Oral BID   enoxaparin (LOVENOX) injection  40 mg Subcutaneous QHS   [START ON 11/05/2020] levofloxacin  750 mg Oral Q48H   [START ON 11/05/2020] levothyroxine  125 mcg Oral QAC breakfast   metoprolol succinate  37.5 mg Oral BID   multivitamin with minerals  1 tablet Oral Daily   Continuous Infusions:  diltiazem (CARDIZEM) infusion 5 mg/hr (11/04/20 1311)   RADIOLOGY:  No results found. ASSESSMENT AND PLAN:  87 year old female with a known history of paroxysmal A. fib, hypertension, CAD, history of recurrent UTI, diabetes type 2 is admitted for A. fib with RVR  9/28: On Cardizem drip, cardiology consult  Principal Problem:   Atrial fibrillation with RVR (HCC) Active Problems:   Essential hypertension   Pyuria   Hyperlipidemia   CAD (coronary artery disease)  A. fib with RVR Likely due to UTI but can also be from too much Synthroid as her TSH is low Continue Cardizem drip with plan to transition to oral Cardizem later today per  cardiology Pending echo Monitor on telemetry Continue metoprolol Decision for anticoagulation per cardiology.  For now continue aspirin  UTI Based on UA, continue Levaquin.  Await urine culture  Essential hypertension Continue enalapril and metoprolol  Hypothyroidism TSH is low, will cut back on Synthroid from 150 to 125 mcg once daily  Hyperlipidemia On statin  CAD On aspirin and  Lipitor  Body mass index is 26.79 kg/m.  Net IO Since Admission: -236.67 mL [11/04/20 1533]      LOS: 0 days   Consultants: Cardiology    Antibiotics: Levaquin  Status is: Inpatient  Remains inpatient appropriate because:Unsafe d/c plan  Dispo: The patient is from: Home              Anticipated d/c is to: Home              Patient currently is not medically stable to d/c.   Difficult to place patient No    DVT prophylaxis:       enoxaparin (LOVENOX) injection 40 mg Start: 11/03/20 2200 Place TED hose Start: 11/03/20 1543     Family Communication: (Updated daughter at bedside")   All the records are reviewed and case discussed with Nursing and TOC team. Management plans discussed with the patient, family and they are in agreement.  CODE STATUS: DNR Level of care: Progressive Cardiac  TOTAL TIME TAKING CARE OF THIS PATIENT: 35 minutes.   More than 50% of the time was spent in counseling/coordination of care: YES  POSSIBLE D/C IN 1-2 DAYS, DEPENDING ON CLINICAL CONDITION.   Delfino Lovett M.D on 11/04/2020 at 3:33 PM  [facility]   CC: Primary care physician; Pcp, No  Note: This dictation was prepared with Dragon dictation along with smaller phrase technology. Any transcriptional errors that result from this process are unintentional.

## 2020-11-04 NOTE — Plan of Care (Signed)
  Problem: Education: Goal: Knowledge of disease or condition will improve Outcome: Progressing Goal: Understanding of medication regimen will improve Outcome: Progressing Goal: Individualized Educational Video(s) Outcome: Progressing   Problem: Cardiac: Goal: Ability to achieve and maintain adequate cardiopulmonary perfusion will improve Outcome: Progressing   Problem: Health Behavior/Discharge Planning: Goal: Ability to safely manage health-related needs after discharge will improve Outcome: Progressing   

## 2020-11-05 DIAGNOSIS — I10 Essential (primary) hypertension: Secondary | ICD-10-CM | POA: Diagnosis not present

## 2020-11-05 DIAGNOSIS — E785 Hyperlipidemia, unspecified: Secondary | ICD-10-CM

## 2020-11-05 DIAGNOSIS — I4891 Unspecified atrial fibrillation: Secondary | ICD-10-CM | POA: Diagnosis not present

## 2020-11-05 LAB — CBC
HCT: 37.3 % (ref 36.0–46.0)
Hemoglobin: 12.3 g/dL (ref 12.0–15.0)
MCH: 31.9 pg (ref 26.0–34.0)
MCHC: 33 g/dL (ref 30.0–36.0)
MCV: 96.6 fL (ref 80.0–100.0)
Platelets: 165 10*3/uL (ref 150–400)
RBC: 3.86 MIL/uL — ABNORMAL LOW (ref 3.87–5.11)
RDW: 12.7 % (ref 11.5–15.5)
WBC: 8 10*3/uL (ref 4.0–10.5)
nRBC: 0 % (ref 0.0–0.2)

## 2020-11-05 LAB — BASIC METABOLIC PANEL
Anion gap: 9 (ref 5–15)
BUN: 23 mg/dL (ref 8–23)
CO2: 25 mmol/L (ref 22–32)
Calcium: 9 mg/dL (ref 8.9–10.3)
Chloride: 101 mmol/L (ref 98–111)
Creatinine, Ser: 0.98 mg/dL (ref 0.44–1.00)
GFR, Estimated: 54 mL/min — ABNORMAL LOW (ref >60)
Glucose, Bld: 126 mg/dL — ABNORMAL HIGH (ref 70–99)
Potassium: 3.6 mmol/L (ref 3.5–5.1)
Sodium: 135 mmol/L (ref 135–145)

## 2020-11-05 LAB — ECHOCARDIOGRAM COMPLETE
Height: 63 in
S' Lateral: 2.3 cm
Weight: 2419.77 oz

## 2020-11-05 MED ORDER — CIPROFLOXACIN HCL 500 MG PO TABS: 500.0000 mg | ORAL_TABLET | Freq: Two times a day (BID) | ORAL | 0 refills | Status: AC

## 2020-11-05 MED ORDER — DILTIAZEM HCL ER COATED BEADS 120 MG PO CP24
120.0000 mg | ORAL_CAPSULE | Freq: Every day | ORAL | Status: DC
  Administered 2020-11-05: 120 mg via ORAL
  Filled 2020-11-05: qty 1

## 2020-11-05 MED ORDER — LEVOTHYROXINE SODIUM 125 MCG PO TABS: 125.0000 ug | ORAL_TABLET | Freq: Every day | ORAL | 0 refills | Status: AC

## 2020-11-05 MED ORDER — DILTIAZEM HCL ER COATED BEADS 120 MG PO CP24: 120.0000 mg | ORAL_CAPSULE | Freq: Every day | ORAL | 0 refills | Status: DC

## 2020-11-05 NOTE — Progress Notes (Signed)
[facility] Cardiology  SUBJECTIVE: Patient sitting on side of the bed, denies chest pain or shortness of breath   Vitals:   11/04/20 2312 11/05/20 0200 11/05/20 0300 11/05/20 0348  BP: 100/70   90/70  Pulse: (!) 56   (!) 140  Resp: 20 (!) 35 16 16  Temp: 98.3 F (36.8 C)   97.8 F (36.6 C)  TempSrc: Oral   Oral  SpO2: 94%   94%  Weight:      Height:         Intake/Output Summary (Last 24 hours) at 11/05/2020 0277 Last data filed at 11/04/2020 0901 Gross per 24 hour  Intake --  Output 350 ml  Net -350 ml      PHYSICAL EXAM  General: Well developed, well nourished, in no acute distress HEENT:  Normocephalic and atramatic Neck:  No JVD.  Lungs: Clear bilaterally to auscultation and percussion. Heart: HRRR . Normal S1 and S2 without gallops or murmurs.  Abdomen: Bowel sounds are positive, abdomen soft and non-tender  Msk:  Back normal, normal gait. Normal strength and tone for age. Extremities: No clubbing, cyanosis or edema.   Neuro: Alert and oriented X 3. Psych:  Good affect, responds appropriately   LABS: Basic Metabolic Panel: Recent Labs    11/04/20 0623 11/05/20 0510  NA 133* 135  K 3.9 3.6  CL 99 101  CO2 26 25  GLUCOSE 123* 126*  BUN 25* 23  CREATININE 1.01* 0.98  CALCIUM 9.2 9.0   Liver Function Tests: Recent Labs    11/03/20 1332  AST 17  ALT 14  ALKPHOS 73  BILITOT 1.0  PROT 6.4*  ALBUMIN 3.7   No results for input(s): LIPASE, AMYLASE in the last 72 hours. CBC: Recent Labs    11/03/20 1332 11/04/20 0623 11/05/20 0510  WBC 10.3 9.0 8.0  NEUTROABS 6.6  --   --   HGB 14.1 12.5 12.3  HCT 43.3 36.7 37.3  MCV 97.5 97.3 96.6  PLT 202 171 165   Cardiac Enzymes: No results for input(s): CKTOTAL, CKMB, CKMBINDEX, TROPONINI in the last 72 hours. BNP: Invalid input(s): POCBNP D-Dimer: No results for input(s): DDIMER in the last 72 hours. Hemoglobin A1C: No results for input(s): HGBA1C in the last 72 hours. Fasting Lipid Panel: No results  for input(s): CHOL, HDL, LDLCALC, TRIG, CHOLHDL, LDLDIRECT in the last 72 hours. Thyroid Function Tests: Recent Labs    11/03/20 2236  TSH 0.121*   Anemia Panel: No results for input(s): VITAMINB12, FOLATE, FERRITIN, TIBC, IRON, RETICCTPCT in the last 72 hours.  US Venous Img Lower Bilateral  Result Date: 11/03/2020 CLINICAL DATA:  87 year old female with leg swelling EXAM: BILATERAL LOWER EXTREMITY VENOUS DOPPLER ULTRASOUND TECHNIQUE: Gray-scale sonography with graded compression, as well as color Doppler and duplex ultrasound were performed to evaluate the lower extremity deep venous systems from the level of the common femoral vein and including the common femoral, femoral, profunda femoral, popliteal and calf veins including the posterior tibial, peroneal and gastrocnemius veins when visible. The superficial great saphenous vein was also interrogated. Spectral Doppler was utilized to evaluate flow at rest and with distal augmentation maneuvers in the common femoral, femoral and popliteal veins. COMPARISON:  None. FINDINGS: RIGHT LOWER EXTREMITY Common Femoral Vein: No evidence of thrombus. Normal compressibility, respiratory phasicity and response to augmentation. Saphenofemoral Junction: No evidence of thrombus. Normal compressibility and flow on color Doppler imaging. Profunda Femoral Vein: No evidence of thrombus. Normal compressibility and flow on color Doppler imaging.  Femoral Vein: No evidence of thrombus. Normal compressibility, respiratory phasicity and response to augmentation. Popliteal Vein: No evidence of thrombus. Normal compressibility, respiratory phasicity and response to augmentation. Calf Veins: No evidence of thrombus. Normal compressibility and flow on color Doppler imaging. Superficial Great Saphenous Vein: No evidence of thrombus. Normal compressibility and flow on color Doppler imaging. Other Findings: Anechoic fluid collection in the popliteal fossa measuring 4.4 cm x 0.9 cm x  3.4 cm LEFT LOWER EXTREMITY Common Femoral Vein: No evidence of thrombus. Normal compressibility, respiratory phasicity and response to augmentation. Saphenofemoral Junction: No evidence of thrombus. Normal compressibility and flow on color Doppler imaging. Profunda Femoral Vein: No evidence of thrombus. Normal compressibility and flow on color Doppler imaging. Femoral Vein: No evidence of thrombus. Normal compressibility, respiratory phasicity and response to augmentation. Popliteal Vein: No evidence of thrombus. Normal compressibility, respiratory phasicity and response to augmentation. Calf Veins: No evidence of thrombus. Normal compressibility and flow on color Doppler imaging. Superficial Great Saphenous Vein: No evidence of thrombus. Normal compressibility and flow on color Doppler imaging. Other Findings: Anechoic fluid collection in the popliteal fossa measuring 7.7 cm x 2.9 cm x 1.3 cm. IMPRESSION: Sonographic survey of the bilateral lower extremities negative for DVT. Bilateral Baker's cyst Electronically Signed   By: Gilmer Mor D.O.   On: 11/03/2020 15:40   DG Chest Portable 1 View  Result Date: 11/03/2020 CLINICAL DATA:  Rapid AFib EXAM: PORTABLE CHEST 1 VIEW COMPARISON:  None. FINDINGS: Cardiac contours within normal limits for AP technique. Rounded opacity with central lucency overlying the mediastinum, likely a moderate hiatal hernia. Mild scattered linear opacities, likely atelectasis. No focal consolidation. No large pleural effusion or evidence of pneumothorax. Moderate scoliosis. IMPRESSION: No focal consolidation. Rounded opacity with central lucency overlying the mediastinum, likely a moderate hiatal hernia. Electronically Signed   By: Allegra Lai M.D.   On: 11/03/2020 14:17     Echo   TELEMETRY: Atrial fibrillation 110 bpm:  ASSESSMENT AND PLAN:  Principal Problem:   Atrial fibrillation with RVR (HCC) Active Problems:   Essential hypertension   Pyuria    Hyperlipidemia   CAD (coronary artery disease)    1. Atrial fibrillation with rapid ventricular rate, likely exacerbated by underlying urinary tract infection.  Patient has a 20-year history of atrial fibrillation, currently on metoprolol succinate for rate control, not anticoagulated.  Heart rate improved after diltiazem bolus and drip. 2.  Essential hypertension, on enalapril and metoprolol succinate 3.  Recurrent UTIs, on Levaquin IV 4.  Hyperlipidemia, on atorvastatin   Recommendations   1.  Agree with overall current therapy 2.  Resume home metoprolol succinate 37.5 mg p.o. twice daily 3.  Incision diltiazem to Cardizem CD 120 mg daily 4.  Review 2D echocardiogram 5.  Briefly discuss chronic anticoagulation with patient and patient's daughter.  Patient has CHA2DS2-VASc of 4, will likely benefit from chronic anticoagulation, which can be initiated as outpatient. 5.  Ambulate patient, consider discharge home later today 6.  Patient has scheduled follow-up with Dr. [name] on 11/05/2020  Sign off for now, please call if any questions       Marcina Millard, MD, PhD, [facility] 11/05/2020 7:04 AM

## 2020-11-05 NOTE — NC FL2 (Addendum)
[place] MEDICAID FL2 LEVEL OF CARE SCREENING TOOL     IDENTIFICATION  Patient Name: Jane Campbell Birthdate: 07-14-1926 Sex: female Admission Date (Current Location): 11/03/2020  [facility] and [place] Number:  Chiropodist and Address:  [facility], [address], [place], [place] [address]      Provider Number: [id number]  Attending Physician Name and Address:  Delfino Lovett, MD  Relative Name and Phone Number:  Kinzy (daughter) [phone]    Current Level of Care: Hospital Recommended Level of Care: Assisted Living Facility Prior Approval Number:    Date Approved/Denied:   PASRR Number:    Discharge Plan: Other (Comment) [facility])    Current Diagnoses: Patient Active Problem List   Diagnosis Date Noted   Atrial fibrillation with RVR (HCC) 11/03/2020   Essential hypertension 11/03/2020   Pyuria 11/03/2020   Hyperlipidemia 11/03/2020   CAD (coronary artery disease) 11/03/2020    Orientation RESPIRATION BLADDER Height & Weight     Self, Time, Situation, Place  Normal Incontinent, External catheter Weight: 151 lb 3.8 oz (68.6 kg) Height:  5\' 3"  (160 cm)  BEHAVIORAL SYMPTOMS/MOOD NEUROLOGICAL BOWEL NUTRITION STATUS      Continent No added salt diet  AMBULATORY STATUS COMMUNICATION OF NEEDS Skin   Independent Verbally Normal                       Personal Care Assistance Level of Assistance  Bathing, Feeding, Dressing, Total care Bathing Assistance: Independent Feeding assistance: Independent Dressing Assistance: Independent Total Care Assistance: Independent   Functional Limitations Info  Sight, Hearing, Speech Sight Info: Impaired Hearing Info: Impaired Speech Info: Adequate    SPECIAL CARE FACTORS FREQUENCY                       Contractures Contractures Info: Not present    Additional Factors Info  Code Status, Allergies Code Status Info: DNR Allergies Info: Penicillins                Discharge Medications: acetaminophen 325 MG tablet Commonly known as: TYLENOL Take 650 mg by mouth 3 (three) times daily as needed for mild pain.    albuterol 108 (90 Base) MCG/ACT inhaler Commonly known as: VENTOLIN HFA Inhale 2 puffs into the lungs every 6 (six) hours as needed for wheezing or shortness of breath.    aspirin EC 81 MG tablet Take 81 mg by mouth daily. Swallow whole.    atorvastatin 10 MG tablet Commonly known as: LIPITOR Take 10 mg by mouth daily.    ciprofloxacin 500 MG tablet Commonly known as: Cipro Take 1 tablet (500 mg total) by mouth 2 (two) times daily for 3 days.    diltiazem 120 MG 24 hr capsule Commonly known as: CARDIZEM CD Take 1 capsule (120 mg total) by mouth daily. Start taking on: November 06, 2020    fluticasone 50 MCG/ACT nasal spray Commonly known as: FLONASE Place 2 sprays into both nostrils daily as needed for allergies or rhinitis.    levothyroxine 125 MCG tablet Commonly known as: SYNTHROID Take 1 tablet (125 mcg total) by mouth daily before breakfast. Start taking on: November 06, 2020 What changed:  medication strength how much to take    loperamide 2 MG capsule Commonly known as: IMODIUM Take 2 mg by mouth 4 (four) times daily as needed for diarrhea or loose stools.    metoprolol succinate 25 MG 24 hr tablet Commonly known as: TOPROL-XL  Take 37.5 mg by mouth 2 (two) times daily.    multivitamin with minerals tablet Take 1 tablet by mouth daily.    ondansetron 4 MG tablet Commonly known as: ZOFRAN Take 4 mg by mouth 2 (two) times daily as needed for nausea or vomiting.            Relevant Imaging Results:  Relevant Lab Results:   Additional Information SSN:[id number]  Gildardo Griffes, LCSW

## 2020-11-05 NOTE — Progress Notes (Signed)
Patient sleeping well tonight. Bed alarm in place.

## 2020-11-05 NOTE — Discharge Summary (Signed)
Physician Discharge Summary   Patient name: Jane Campbell  Admit date:     11/03/2020  Discharge date: 11/05/2020  Attending Physician: Delfino Lovett [[id number]]  Discharge Physician: Delfino Lovett   PCP: Pcp, No    Follow-up Information     Jane Blinks, MD. Go on 11/09/2020.   Specialty: Cardiology Why: @ 2:30 pm, Jane Campbell Discharge F/UP Contact information: [address] [facility] [place] [address] [phone]                 Recommendations at discharge: Follow-up with Dr. Arnoldo Hooker on Monday, October 3 at 2:30 PM as scheduled  Discharge Diagnoses Principal Problem:   Atrial fibrillation with RVR [facility]) Active Problems:   Essential hypertension   Pyuria   Hyperlipidemia   CAD (coronary artery disease)  Resolved Diagnoses Resolved Problems:   * No resolved hospital problems. [facility]   87 year old female with a known history of paroxysmal A. fib, hypertension, CAD, history of recurrent UTI, diabetes type 2 is admitted for A. fib with RVR  9/28: On Cardizem drip, cardiology consult  A. fib with RVR Likely due to mild UTI and/or too much Synthroid as evidenced by low TSH Rate is well controlled by adding Cardizem CD1 20 mg p.o. daily.  Also continuing metoprolol 37.5 mg p.o. twice daily Will need outpatient follow-up with Dr. Gwen Campbell as scheduled on coming Monday Decision for anticoagulation as an outpatient.  Continue aspirin for now  UTI Based on UA, 5 days of total antibiotic  Hypotension with history of essential hypertension Holding enalapril and Maxide to allow room for Cardizem and metoprolol   Hypothyroidism TSH is low reduce Synthroid dose from 150 -> 125 mcg once daily  Hyperlipidemia On statin  CAD On aspirin and Lipitor  Procedures performed: None  Condition at discharge: good  Exam Physical Exam  87 year old female lying in the bed comfortably without any acute distress Eyes  pupil equal round reactive to light and accommodation, no scleral icterus Lungs clear to auscultation bilaterally no wheezing rales rhonchi or crepitation Cardiovascular S1-S2 normal, no murmur rales or gallop Abdomen soft, benign Neuro alert and oriented, nonfocal Skin no rash or lesion  Disposition:  [facility] assisted living  Discharge time: greater than 30 minutes. Allergies as of 11/05/2020       Reactions   Penicillins         Medication List     STOP taking these medications    enalapril 10 MG tablet Commonly known as: VASOTEC   triamterene-hydrochlorothiazide 37.5-25 MG tablet Commonly known as: MAXZIDE-25       TAKE these medications    acetaminophen 325 MG tablet Commonly known as: TYLENOL Take 650 mg by mouth 3 (three) times daily as needed for mild pain.   albuterol 108 (90 Base) MCG/ACT inhaler Commonly known as: VENTOLIN HFA Inhale 2 puffs into the lungs every 6 (six) hours as needed for wheezing or shortness of breath.   aspirin EC 81 MG tablet Take 81 mg by mouth daily. Swallow whole.   atorvastatin 10 MG tablet Commonly known as: LIPITOR Take 10 mg by mouth daily.   ciprofloxacin 500 MG tablet Commonly known as: Cipro Take 1 tablet (500 mg total) by mouth 2 (two) times daily for 3 days.   diltiazem 120 MG 24 hr capsule Commonly known as: CARDIZEM CD Take 1 capsule (120 mg total) by mouth daily. Start taking on: November 06, 2020   fluticasone 50  MCG/ACT nasal spray Commonly known as: FLONASE Place 2 sprays into both nostrils daily as needed for allergies or rhinitis.   levothyroxine 125 MCG tablet Commonly known as: SYNTHROID Take 1 tablet (125 mcg total) by mouth daily before breakfast. Start taking on: November 06, 2020 What changed:  medication strength how much to take   loperamide 2 MG capsule Commonly known as: IMODIUM Take 2 mg by mouth 4 (four) times daily as needed for diarrhea or loose stools.   metoprolol  succinate 25 MG 24 hr tablet Commonly known as: TOPROL-XL Take 37.5 mg by mouth 2 (two) times daily.   multivitamin with minerals tablet Take 1 tablet by mouth daily.   ondansetron 4 MG tablet Commonly known as: ZOFRAN Take 4 mg by mouth 2 (two) times daily as needed for nausea or vomiting.        US Venous Img Lower Bilateral  Result Date: 11/03/2020 CLINICAL DATA:  87 year old female with leg swelling EXAM: BILATERAL LOWER EXTREMITY VENOUS DOPPLER ULTRASOUND TECHNIQUE: Gray-scale sonography with graded compression, as well as color Doppler and duplex ultrasound were performed to evaluate the lower extremity deep venous systems from the level of the common femoral vein and including the common femoral, femoral, profunda femoral, popliteal and calf veins including the posterior tibial, peroneal and gastrocnemius veins when visible. The superficial great saphenous vein was also interrogated. Spectral Doppler was utilized to evaluate flow at rest and with distal augmentation maneuvers in the common femoral, femoral and popliteal veins. COMPARISON:  None. FINDINGS: RIGHT LOWER EXTREMITY Common Femoral Vein: No evidence of thrombus. Normal compressibility, respiratory phasicity and response to augmentation. Saphenofemoral Junction: No evidence of thrombus. Normal compressibility and flow on color Doppler imaging. Profunda Femoral Vein: No evidence of thrombus. Normal compressibility and flow on color Doppler imaging. Femoral Vein: No evidence of thrombus. Normal compressibility, respiratory phasicity and response to augmentation. Popliteal Vein: No evidence of thrombus. Normal compressibility, respiratory phasicity and response to augmentation. Calf Veins: No evidence of thrombus. Normal compressibility and flow on color Doppler imaging. Superficial Great Saphenous Vein: No evidence of thrombus. Normal compressibility and flow on color Doppler imaging. Other Findings: Anechoic fluid collection in the  popliteal fossa measuring 4.4 cm x 0.9 cm x 3.4 cm LEFT LOWER EXTREMITY Common Femoral Vein: No evidence of thrombus. Normal compressibility, respiratory phasicity and response to augmentation. Saphenofemoral Junction: No evidence of thrombus. Normal compressibility and flow on color Doppler imaging. Profunda Femoral Vein: No evidence of thrombus. Normal compressibility and flow on color Doppler imaging. Femoral Vein: No evidence of thrombus. Normal compressibility, respiratory phasicity and response to augmentation. Popliteal Vein: No evidence of thrombus. Normal compressibility, respiratory phasicity and response to augmentation. Calf Veins: No evidence of thrombus. Normal compressibility and flow on color Doppler imaging. Superficial Great Saphenous Vein: No evidence of thrombus. Normal compressibility and flow on color Doppler imaging. Other Findings: Anechoic fluid collection in the popliteal fossa measuring 7.7 cm x 2.9 cm x 1.3 cm. IMPRESSION: Sonographic survey of the bilateral lower extremities negative for DVT. Bilateral Baker's cyst Electronically Signed   By: Gilmer Mor D.O.   On: 11/03/2020 15:40   DG Chest Portable 1 View  Result Date: 11/03/2020 CLINICAL DATA:  Rapid AFib EXAM: PORTABLE CHEST 1 VIEW COMPARISON:  None. FINDINGS: Cardiac contours within normal limits for AP technique. Rounded opacity with central lucency overlying the mediastinum, likely a moderate hiatal hernia. Mild scattered linear opacities, likely atelectasis. No focal consolidation. No large pleural effusion or evidence of pneumothorax. Moderate  scoliosis. IMPRESSION: No focal consolidation. Rounded opacity with central lucency overlying the mediastinum, likely a moderate hiatal hernia. Electronically Signed   By: Allegra Lai M.D.   On: 11/03/2020 14:17   ECHOCARDIOGRAM COMPLETE  Result Date: 11/05/2020    ECHOCARDIOGRAM REPORT   Patient Name:   CHIEKO Setter Date of Exam: 11/04/2020 Medical Rec #:  [id number]  Height:        63.0 in Accession #:    [id number] Weight:       151.2 lb Date of Birth:  1926-11-11  BSA:          1.717 m Patient Age:    89 years   BP:           120/79 mmHg Patient Gender: F          HR:           102 bpm. Exam Location:  [facility] Procedure: 2D Echo, Cardiac Doppler and Color Doppler Indications:     I48.91 Atrial Fibrillation  History:         Patient has no prior history of Echocardiogram examinations.                  CAD, Arrythmias:Atrial Fibrillation; Risk Factors:Hypertension                  and Diabetes.  Sonographer:     Daphine Deutscher RDCS Referring Phys:  [id number] Kinley Ferrentino [facility] Diagnosing Phys: Marcina Millard MD IMPRESSIONS  1. Left ventricular ejection fraction, by estimation, is 60 to 65%. The left ventricle has normal function. The left ventricle has no regional wall motion abnormalities. There is moderate left ventricular hypertrophy. Left ventricular diastolic parameters are indeterminate.  2. Right ventricular systolic function is normal. The right ventricular size is normal.  3. Left atrial size was moderately dilated.  4. Right atrial size was moderately dilated.  5. The mitral valve is normal in structure. Moderate mitral valve regurgitation. No evidence of mitral stenosis.  6. Tricuspid valve regurgitation is moderate.  7. The aortic valve is normal in structure. Aortic valve regurgitation is not visualized. Mild aortic valve sclerosis is present, with no evidence of aortic valve stenosis.  8. The inferior vena cava is normal in size with greater than 50% respiratory variability, suggesting right atrial pressure of 3 mmHg. FINDINGS  Left Ventricle: Left ventricular ejection fraction, by estimation, is 60 to 65%. The left ventricle has normal function. The left ventricle has no regional wall motion abnormalities. The left ventricular internal cavity size was normal in size. There is  moderate left ventricular hypertrophy. Left ventricular diastolic parameters are indeterminate.  Right Ventricle: The right ventricular size is normal. No increase in right ventricular wall thickness. Right ventricular systolic function is normal. Left Atrium: Left atrial size was moderately dilated. Right Atrium: Right atrial size was moderately dilated. Pericardium: There is no evidence of pericardial effusion. Mitral Valve: The mitral valve is normal in structure. Moderate mitral valve regurgitation. No evidence of mitral valve stenosis. Tricuspid Valve: The tricuspid valve is normal in structure. Tricuspid valve regurgitation is moderate . No evidence of tricuspid stenosis. Aortic Valve: The aortic valve is normal in structure. Aortic valve regurgitation is not visualized. Mild aortic valve sclerosis is present, with no evidence of aortic valve stenosis. Pulmonic Valve: The pulmonic valve was normal in structure. Pulmonic valve regurgitation is not visualized. No evidence of pulmonic stenosis. Aorta: The aortic root is normal in size and structure. Venous: The inferior vena cava is normal  in size with greater than 50% respiratory variability, suggesting right atrial pressure of 3 mmHg. IAS/Shunts: No atrial level shunt detected by color flow Doppler.  LEFT VENTRICLE PLAX 2D LVIDd:         3.70 cm LVIDs:         2.30 cm LV PW:         1.00 cm LV IVS:        1.00 cm LVOT diam:     1.90 cm LV SV:         47 LV SV Index:   27 LVOT Area:     2.84 cm  RIGHT VENTRICLE             IVC RV Basal diam:  3.20 cm     IVC diam: 1.80 cm RV S prime:     11.80 cm/s TAPSE (M-mode): 2.2 cm LEFT ATRIUM             Index       RIGHT ATRIUM           Index LA diam:        4.00 cm 2.33 cm/m  RA Area:     21.30 cm LA Vol (A2C):   53.3 ml 31.04 ml/m RA Volume:   66.50 ml  38.73 ml/m LA Vol (A4C):   82.7 ml 48.16 ml/m LA Biplane Vol: 69.4 ml 40.42 ml/m  AORTIC VALVE LVOT Vmax:   85.60 cm/s LVOT Vmean:  64.700 cm/s LVOT VTI:    0.166 m  AORTA Ao Root diam: 3.00 cm MV E velocity: 150.33 cm/s  TRICUSPID VALVE                              TR Peak grad:   27.2 mmHg                             TR Vmax:        261.00 cm/s                              SHUNTS                             Systemic VTI:  0.17 m                             Systemic Diam: 1.90 cm Marcina Millard MD Electronically signed by Marcina Millard MD Signature Date/Time: 11/05/2020/7:14:53 AM    Final    Results for orders placed or performed during the hospital encounter of 11/03/20  Resp Panel by RT-PCR (Flu A&B, Covid) Nasopharyngeal Swab     Status: None   Collection Time: 11/03/20  1:17 PM   Specimen: Nasopharyngeal Swab; Nasopharyngeal(NP) swabs in vial transport medium  Result Value Ref Range Status   SARS Coronavirus 2 by RT PCR NEGATIVE NEGATIVE Final    Comment: (NOTE) SARS-CoV-2 target nucleic acids are NOT DETECTED.  The SARS-CoV-2 RNA is generally detectable in upper respiratory specimens during the acute phase of infection. The lowest concentration of SARS-CoV-2 viral copies this assay can detect is 138 copies/mL. A negative result does not preclude SARS-Cov-2 infection and should not be used as the sole basis for treatment or other patient management decisions. A negative  result may occur with  improper specimen collection/handling, submission of specimen other than nasopharyngeal swab, presence of viral mutation(s) within the areas targeted by this assay, and inadequate number of viral copies(<138 copies/mL). A negative result must be combined with clinical observations, patient history, and epidemiological information. The expected result is Negative.  Fact Sheet for Patients:  BloggerCourse.com  Fact Sheet for Healthcare Providers:  SeriousBroker.it  This test is no t yet approved or cleared by the [place] FDA and  has been authorized for detection and/or diagnosis of SARS-CoV-2 by FDA under an Emergency Use Authorization (EUA). This EUA will remain  in effect  (meaning this test can be used) for the duration of the COVID-19 declaration under Section 564(b)(1) of the Act, 21 U.S.C.section 360bbb-3(b)(1), unless the authorization is terminated  or revoked sooner.       Influenza A by PCR NEGATIVE NEGATIVE Final   Influenza B by PCR NEGATIVE NEGATIVE Final    Comment: (NOTE) The Xpert Xpress SARS-CoV-2/FLU/RSV plus assay is intended as an aid in the diagnosis of influenza from Nasopharyngeal swab specimens and should not be used as a sole basis for treatment. Nasal washings and aspirates are unacceptable for Xpert Xpress SARS-CoV-2/FLU/RSV testing.  Fact Sheet for Patients: BloggerCourse.com  Fact Sheet for Healthcare Providers: SeriousBroker.it  This test is not yet approved or cleared by the [place] FDA and has been authorized for detection and/or diagnosis of SARS-CoV-2 by FDA under an Emergency Use Authorization (EUA). This EUA will remain in effect (meaning this test can be used) for the duration of the COVID-19 declaration under Section 564(b)(1) of the Act, 21 U.S.C. section 360bbb-3(b)(1), unless the authorization is terminated or revoked.  Performed at [facility], [address], [place], [place] [address]     Signed:  Delfino Lovett MD.  [facility] 11/05/2020, 11:58 AM

## 2020-11-05 NOTE — TOC Transition Note (Signed)
Transition of Care [facility]) - CM/SW Discharge Note   Patient Details  Name: Jane Campbell MRN: [id number] Date of Birth: [birth year]  [facility]) CM/SW Contact:  Gildardo Griffes, LCSW Phone Number: 11/05/2020, 12:27 PM   Clinical Narrative:     Patient will DC to: [facility] Anticipated DC date: 11/05/20 Family notified:daughter at bedside Transport [id number]  Per MD patient ready for DC to [facility] . RN, patient, patient's family, and facility notified of DC. Discharge Summary and fl2 sent to facility at [phone]. RN given number for report  [phone]. DC packet on chart.  CSW signing off.  Angeline Slim, LCSW    Final next level of care: Assisted Living Barriers to Discharge: No Barriers Identified   Patient Goals and CMS Choice Patient states their goals for this hospitalization and ongoing recovery are:: to go home CMS Medicare.gov Compare Post Acute Care list provided to:: Patient Choice offered to / list presented to : Patient  Discharge Placement                    Patient and family notified of of transfer: 11/05/20  Discharge Plan and Services                                     Social Determinants of Health (SDOH) Interventions     Readmission Risk Interventions No flowsheet data found.

## 2020-11-30 ENCOUNTER — Inpatient Hospital Stay
Admission: EM | Admit: 2020-11-30 | Discharge: 2020-12-21 | DRG: 377 | Disposition: A | Discharge status: Skilled Nursing Facility | Payer: Medicare Other | Attending: Internal Medicine | Admitting: Internal Medicine

## 2020-11-30 ENCOUNTER — Emergency Department: Payer: Medicare Other

## 2020-11-30 ENCOUNTER — Other Ambulatory Visit: Payer: Self-pay

## 2020-11-30 DIAGNOSIS — I081 Rheumatic disorders of both mitral and tricuspid valves: Secondary | ICD-10-CM | POA: Diagnosis present

## 2020-11-30 DIAGNOSIS — R609 Edema, unspecified: Secondary | ICD-10-CM | POA: Diagnosis present

## 2020-11-30 DIAGNOSIS — E119 Type 2 diabetes mellitus without complications: Secondary | ICD-10-CM

## 2020-11-30 DIAGNOSIS — Z7982 Long term (current) use of aspirin: Secondary | ICD-10-CM

## 2020-11-30 DIAGNOSIS — K5900 Constipation, unspecified: Secondary | ICD-10-CM | POA: Diagnosis not present

## 2020-11-30 DIAGNOSIS — F419 Anxiety disorder, unspecified: Secondary | ICD-10-CM | POA: Diagnosis present

## 2020-11-30 DIAGNOSIS — K922 Gastrointestinal hemorrhage, unspecified: Secondary | ICD-10-CM | POA: Diagnosis present

## 2020-11-30 DIAGNOSIS — J189 Pneumonia, unspecified organism: Secondary | ICD-10-CM

## 2020-11-30 DIAGNOSIS — I959 Hypotension, unspecified: Secondary | ICD-10-CM | POA: Diagnosis not present

## 2020-11-30 DIAGNOSIS — N183 Chronic kidney disease, stage 3 unspecified: Secondary | ICD-10-CM | POA: Diagnosis present

## 2020-11-30 DIAGNOSIS — Y92009 Unspecified place in unspecified non-institutional (private) residence as the place of occurrence of the external cause: Secondary | ICD-10-CM | POA: Diagnosis not present

## 2020-11-30 DIAGNOSIS — R710 Precipitous drop in hematocrit: Secondary | ICD-10-CM | POA: Diagnosis present

## 2020-11-30 DIAGNOSIS — I5033 Acute on chronic diastolic (congestive) heart failure: Secondary | ICD-10-CM | POA: Diagnosis not present

## 2020-11-30 DIAGNOSIS — L03116 Cellulitis of left lower limb: Secondary | ICD-10-CM | POA: Diagnosis present

## 2020-11-30 DIAGNOSIS — E1122 Type 2 diabetes mellitus with diabetic chronic kidney disease: Secondary | ICD-10-CM | POA: Diagnosis present

## 2020-11-30 DIAGNOSIS — I480 Paroxysmal atrial fibrillation: Secondary | ICD-10-CM | POA: Diagnosis present

## 2020-11-30 DIAGNOSIS — K264 Chronic or unspecified duodenal ulcer with hemorrhage: Secondary | ICD-10-CM | POA: Diagnosis present

## 2020-11-30 DIAGNOSIS — I2510 Atherosclerotic heart disease of native coronary artery without angina pectoris: Secondary | ICD-10-CM | POA: Diagnosis present

## 2020-11-30 DIAGNOSIS — K250 Acute gastric ulcer with hemorrhage: Secondary | ICD-10-CM | POA: Diagnosis not present

## 2020-11-30 DIAGNOSIS — Z79890 Hormone replacement therapy (postmenopausal): Secondary | ICD-10-CM

## 2020-11-30 DIAGNOSIS — D696 Thrombocytopenia, unspecified: Secondary | ICD-10-CM | POA: Diagnosis not present

## 2020-11-30 DIAGNOSIS — N179 Acute kidney failure, unspecified: Secondary | ICD-10-CM | POA: Diagnosis not present

## 2020-11-30 DIAGNOSIS — F32A Depression, unspecified: Secondary | ICD-10-CM | POA: Diagnosis present

## 2020-11-30 DIAGNOSIS — I130 Hypertensive heart and chronic kidney disease with heart failure and stage 1 through stage 4 chronic kidney disease, or unspecified chronic kidney disease: Secondary | ICD-10-CM | POA: Diagnosis present

## 2020-11-30 DIAGNOSIS — Z79899 Other long term (current) drug therapy: Secondary | ICD-10-CM

## 2020-11-30 DIAGNOSIS — I4891 Unspecified atrial fibrillation: Secondary | ICD-10-CM | POA: Diagnosis not present

## 2020-11-30 DIAGNOSIS — I10 Essential (primary) hypertension: Secondary | ICD-10-CM | POA: Diagnosis present

## 2020-11-30 DIAGNOSIS — K648 Other hemorrhoids: Secondary | ICD-10-CM | POA: Diagnosis present

## 2020-11-30 DIAGNOSIS — E039 Hypothyroidism, unspecified: Secondary | ICD-10-CM | POA: Diagnosis present

## 2020-11-30 DIAGNOSIS — W19XXXA Unspecified fall, initial encounter: Secondary | ICD-10-CM | POA: Diagnosis present

## 2020-11-30 DIAGNOSIS — E785 Hyperlipidemia, unspecified: Secondary | ICD-10-CM | POA: Diagnosis present

## 2020-11-30 DIAGNOSIS — E876 Hypokalemia: Secondary | ICD-10-CM | POA: Diagnosis present

## 2020-11-30 DIAGNOSIS — K625 Hemorrhage of anus and rectum: Secondary | ICD-10-CM | POA: Diagnosis not present

## 2020-11-30 DIAGNOSIS — U071 COVID-19: Secondary | ICD-10-CM

## 2020-11-30 DIAGNOSIS — Z6829 Body mass index (BMI) 29.0-29.9, adult: Secondary | ICD-10-CM

## 2020-11-30 DIAGNOSIS — Z7901 Long term (current) use of anticoagulants: Secondary | ICD-10-CM

## 2020-11-30 DIAGNOSIS — K449 Diaphragmatic hernia without obstruction or gangrene: Secondary | ICD-10-CM | POA: Diagnosis present

## 2020-11-30 DIAGNOSIS — R0602 Shortness of breath: Secondary | ICD-10-CM

## 2020-11-30 DIAGNOSIS — Z66 Do not resuscitate: Secondary | ICD-10-CM | POA: Diagnosis present

## 2020-11-30 DIAGNOSIS — Z880 Allergy status to penicillin: Secondary | ICD-10-CM

## 2020-11-30 DIAGNOSIS — Z5181 Encounter for therapeutic drug level monitoring: Secondary | ICD-10-CM

## 2020-11-30 DIAGNOSIS — Z87891 Personal history of nicotine dependence: Secondary | ICD-10-CM

## 2020-11-30 LAB — LIPASE, BLOOD: Lipase: 40 U/L (ref 11–51)

## 2020-11-30 LAB — COMPREHENSIVE METABOLIC PANEL
ALT: 48 U/L — ABNORMAL HIGH (ref 0–44)
AST: 21 U/L (ref 15–41)
Albumin: 3.1 g/dL — ABNORMAL LOW (ref 3.5–5.0)
Alkaline Phosphatase: 59 U/L (ref 38–126)
Anion gap: 8 (ref 5–15)
BUN: 56 mg/dL — ABNORMAL HIGH (ref 8–23)
CO2: 31 mmol/L (ref 22–32)
Calcium: 8.5 mg/dL — ABNORMAL LOW (ref 8.9–10.3)
Chloride: 98 mmol/L (ref 98–111)
Creatinine, Ser: 1.05 mg/dL — ABNORMAL HIGH (ref 0.44–1.00)
GFR, Estimated: 50 mL/min — ABNORMAL LOW (ref >60)
Glucose, Bld: 168 mg/dL — ABNORMAL HIGH (ref 70–99)
Potassium: 3.3 mmol/L — ABNORMAL LOW (ref 3.5–5.1)
Sodium: 137 mmol/L (ref 135–145)
Total Bilirubin: 1.4 mg/dL — ABNORMAL HIGH (ref 0.3–1.2)
Total Protein: 5.2 g/dL — ABNORMAL LOW (ref 6.5–8.1)

## 2020-11-30 LAB — CBC WITH DIFFERENTIAL/PLATELET
Abs Immature Granulocytes: 0.09 10*3/uL — ABNORMAL HIGH (ref 0.00–0.07)
Basophils Absolute: 0 10*3/uL (ref 0.0–0.1)
Basophils Relative: 0 %
Eosinophils Absolute: 0 10*3/uL (ref 0.0–0.5)
Eosinophils Relative: 0 %
HCT: 33.6 % — ABNORMAL LOW (ref 36.0–46.0)
Hemoglobin: 11.2 g/dL — ABNORMAL LOW (ref 12.0–15.0)
Immature Granulocytes: 1 %
Lymphocytes Relative: 13 %
Lymphs Abs: 1.7 10*3/uL (ref 0.7–4.0)
MCH: 33.2 pg (ref 26.0–34.0)
MCHC: 33.3 g/dL (ref 30.0–36.0)
MCV: 99.7 fL (ref 80.0–100.0)
Monocytes Absolute: 0.7 10*3/uL (ref 0.1–1.0)
Monocytes Relative: 5 %
Neutro Abs: 10.4 10*3/uL — ABNORMAL HIGH (ref 1.7–7.7)
Neutrophils Relative %: 81 %
Platelets: 195 10*3/uL (ref 150–400)
RBC: 3.37 MIL/uL — ABNORMAL LOW (ref 3.87–5.11)
RDW: 14.7 % (ref 11.5–15.5)
WBC: 12.9 10*3/uL — ABNORMAL HIGH (ref 4.0–10.5)
nRBC: 0 % (ref 0.0–0.2)

## 2020-11-30 LAB — APTT: aPTT: 29 seconds (ref 24–36)

## 2020-11-30 LAB — PROTIME-INR
INR: 1.5 — ABNORMAL HIGH (ref 0.8–1.2)
Prothrombin Time: 18.2 seconds — ABNORMAL HIGH (ref 11.4–15.2)

## 2020-11-30 LAB — TROPONIN I (HIGH SENSITIVITY): Troponin I (High Sensitivity): 18 ng/L — ABNORMAL HIGH (ref <18)

## 2020-11-30 LAB — HEPARIN LEVEL (UNFRACTIONATED): Heparin Unfractionated: >1.1 IU/mL — ABNORMAL HIGH (ref 0.30–0.70)

## 2020-11-30 MED ORDER — IOHEXOL 350 MG/ML SOLN
100.0000 mL | Freq: Once | INTRAVENOUS | Status: AC | PRN
  Administered 2020-11-30: 100 mL via INTRAVENOUS

## 2020-11-30 MED ORDER — PANTOPRAZOLE SODIUM 40 MG IV SOLR
40.0000 mg | Freq: Once | INTRAVENOUS | Status: AC
  Administered 2020-11-30: 40 mg via INTRAVENOUS
  Filled 2020-11-30: qty 40

## 2020-11-30 MED ORDER — SODIUM CHLORIDE 0.9 % IV BOLUS
1000.0000 mL | Freq: Once | INTRAVENOUS | Status: AC
  Administered 2020-11-30: 1000 mL via INTRAVENOUS

## 2020-11-30 NOTE — H&P (Signed)
History and Physical   Jane Campbell QPY:[id number] DOB: 05/07/1926 DOA: 11/30/2020  Referring MD/NP/PA: Dr. Scotty Court  PCP: Pcp, No   Outpatient Specialists: Dr. [name]  Patient coming from: Home  Chief Complaint: GI bleed and fall  HPI: Jane Campbell is a 87 y.o. female with medical history significant of atrial fibrillation, coronary artery disease, diet-controlled diabetes, essential hypertension, bilateral lower extremity edema who was recently started on Eliquis on October 3.  Patient has solitary kidney also and was actually being taken off diuretics for hypotension and possibly rising creatinine.  Patient has had nausea crampy abdominal pain and rectal bleeding started in the ER.  Initially melena and now bright red blood.  She had weakness at home almost fell down to the ground she had to lower herself to the ground.  No syncope.  Generally weak now.  H&H appears still stable but patient is tachycardic and appears to be symptomatic from acute GI bleed.  Stool guaiac is positive.  Patient being admitted to the hospital for acute GI bleed most likely secondary to the Eliquis she started 2 weeks ago..  ED Course: Afebrile with blood pressure 120/90, pulse 150 respirate of 22 oxygen sat 98% room air.  White count is 12.9 hemoglobin 11.2 and platelets 195.  Sodium 137 potassium 3.3 chloride 98 CO2 of 31 BUN 56 creatinine 1.05 calcium 8.5.  INR 1.5 and glucose 168.  Patient's last hemoglobin about a month ago was 12.3.  CT angiogram of for GI bleed is pending.  Patient being admitted with GI bleed.  Review of Systems: As per HPI otherwise 10 point review of systems negative.    Past Medical History:  Diagnosis Date   A-fib [facility])    Coronary artery disease    Diabetes mellitus without complication (HCC)    Hypertension     Past Surgical History:  Procedure Laterality Date   ABDOMINAL HYSTERECTOMY     APPENDECTOMY     Left kidney Removed       reports that she quit smoking about 58  years ago. Her smoking use included cigarettes. She has quit using smokeless tobacco. She reports that she does not currently use alcohol. She reports that she does not use drugs.  Allergies  Allergen Reactions   Penicillins     History reviewed. No pertinent family history.   Prior to Admission medications   Medication Sig Start Date End Date Taking? Authorizing Provider  acetaminophen (TYLENOL) 325 MG tablet Take 650 mg by mouth 3 (three) times daily as needed for mild pain.    [provider]  albuterol (VENTOLIN HFA) 108 (90 Base) MCG/ACT inhaler Inhale 2 puffs into the lungs every 6 (six) hours as needed for wheezing or shortness of breath.    [provider]  aspirin EC 81 MG tablet Take 81 mg by mouth daily. Swallow whole.    [provider]  atorvastatin (LIPITOR) 10 MG tablet Take 10 mg by mouth daily.    [provider]  diltiazem (CARDIZEM CD) 120 MG 24 hr capsule Take 1 capsule (120 mg total) by mouth daily. 11/06/20 12/06/20  Delfino Lovett, MD  fluticasone (FLONASE) 50 MCG/ACT nasal spray Place 2 sprays into both nostrils daily as needed for allergies or rhinitis.    [provider]  levothyroxine (SYNTHROID) 125 MCG tablet Take 1 tablet (125 mcg total) by mouth daily before breakfast. 11/06/20 12/06/20  Delfino Lovett, MD  loperamide (IMODIUM) 2 MG capsule Take 2 mg by mouth 4 (four) times  daily as needed for diarrhea or loose stools.    [provider]  metoprolol succinate (TOPROL-XL) 25 MG 24 hr tablet Take 37.5 mg by mouth 2 (two) times daily.    [provider]  Multiple Vitamins-Minerals (MULTIVITAMIN WITH MINERALS) tablet Take 1 tablet by mouth daily.    [provider]  ondansetron (ZOFRAN) 4 MG tablet Take 4 mg by mouth 2 (two) times daily as needed for nausea or vomiting.    [provider]    Physical Exam: Vitals:   11/30/20 2150  BP: 121/90  Pulse: (!) 150  Resp: (!) 22  SpO2: 98%       Constitutional: Acutely ill looking no distress Vitals:   11/30/20 2150  BP: 121/90  Pulse: (!) 150  Resp: (!) 22  SpO2: 98%   Eyes: PERRL, lids and conjunctivae normal ENMT: Mucous membranes are moist. Posterior pharynx clear of any exudate or lesions.Normal dentition.  Neck: normal, supple, no masses, no thyromegaly Respiratory: clear to auscultation bilaterally, no wheezing, no crackles. Normal respiratory effort. No accessory muscle use.  Cardiovascular: Irregularly irregular with tachycardia, no murmurs / rubs / gallops.  2+ pedal edema. 2+ pedal pulses. No carotid bruits.  Abdomen: no tenderness, no masses palpated. No hepatosplenomegaly. Bowel sounds positive.  Musculoskeletal: no clubbing / cyanosis. No joint deformity upper and lower extremities. Good ROM, no contractures. Normal muscle tone.  Skin: no rashes, lesions, ulcers. No induration Neurologic: CN 2-12 grossly intact. Sensation intact, DTR normal. Strength 5/5 in all 4.  Psychiatric: Normal judgment and insight. Alert and oriented x 3. Normal mood.     Labs on Admission: I have personally reviewed following labs and imaging studies  CBC: Recent Labs  Lab 11/30/20 2108  WBC 12.9*  NEUTROABS 10.4*  HGB 11.2*  HCT 33.6*  MCV 99.7  PLT 195   Basic Metabolic Panel: Recent Labs  Lab 11/30/20 2108  NA 137  K 3.3*  CL 98  CO2 31  GLUCOSE 168*  BUN 56*  CREATININE 1.05*  CALCIUM 8.5*   GFR: CrCl cannot be calculated (Unknown ideal weight.). Liver Function Tests: Recent Labs  Lab 11/30/20 2108  AST 21  ALT 48*  ALKPHOS 59  BILITOT 1.4*  PROT 5.2*  ALBUMIN 3.1*   Recent Labs  Lab 11/30/20 2108  LIPASE 40   No results for input(s): AMMONIA in the last 168 hours. Coagulation Profile: Recent Labs  Lab 11/30/20 2108  INR 1.5*   Cardiac Enzymes: No results for input(s): CKTOTAL, CKMB, CKMBINDEX, TROPONINI in the last 168 hours. BNP (last 3 results) No results for input(s): PROBNP  in the last 8760 hours. HbA1C: No results for input(s): HGBA1C in the last 72 hours. CBG: No results for input(s): GLUCAP in the last 168 hours. Lipid Profile: No results for input(s): CHOL, HDL, LDLCALC, TRIG, CHOLHDL, LDLDIRECT in the last 72 hours. Thyroid Function Tests: No results for input(s): TSH, T4TOTAL, FREET4, T3FREE, THYROIDAB in the last 72 hours. Anemia Panel: No results for input(s): VITAMINB12, FOLATE, FERRITIN, TIBC, IRON, RETICCTPCT in the last 72 hours. Urine analysis:    Component Value Date/Time   COLORURINE YELLOW (A) 11/03/2020 1317   APPEARANCEUR HAZY (A) 11/03/2020 1317   LABSPEC 1.011 11/03/2020 1317   PHURINE 7.0 11/03/2020 1317   GLUCOSEU NEGATIVE 11/03/2020 1317   HGBUR NEGATIVE 11/03/2020 1317   BILIRUBINUR NEGATIVE 11/03/2020 1317   KETONESUR NEGATIVE 11/03/2020 1317   PROTEINUR NEGATIVE 11/03/2020 1317   NITRITE NEGATIVE 11/03/2020 1317   LEUKOCYTESUR  SMALL (A) 11/03/2020 1317   Sepsis Labs: @LABRCNTIP (procalcitonin:4,lacticidven:4) )No results found for this or any previous visit (from the past 240 hour(s)).   Radiological Exams on Admission: No results found.  EKG: Independently reviewed.  A. fib with RVR  Assessment/Plan Principal Problem:   GI bleed Active Problems:   Atrial fibrillation with RVR (HCC)   Essential hypertension   Hyperlipidemia   CAD (coronary artery disease)   Edema   Diabetes (HCC)   Hypokalemia     #1 GI bleed: Patient will be admitted and initiated on IV Protonix, serial CBCs, GI consult.  We will hold the Eliquis and keep patient n.p.o.  #2 A. fib with RVR: In the setting of GI bleed worrisome for possible hypotension.  Fluid resuscitation with resumption of beta-blockers if blood pressure allows.  #3 essential hypertension: Blood pressure is slightly borderline.  On diltiazem and metoprolol.  Worrisome about drop in blood pressure in the setting of GI bleed.  #4 hyperlipidemia: Continue with  atorvastatin  #5 hypokalemia: Continue to replete.  #6 bilateral lower extremity edema: Patient has bilateral lower extremity edema 2+.  Has been taking diuretics only 3 times a week before.  Now has been off for a while.  Compression stockings recommended.  #7 coronary artery disease: Continue home regimen.  #8 hypothyroidism: Continue with levothyroxine   DVT prophylaxis: SCD Code Status: DNR Family Communication: No family at bedside Disposition Plan: Home Consults called: Consult GI in the morning Admission status: Inpatient  Severity of Illness: The appropriate patient status for this patient is INPATIENT. Inpatient status is judged to be reasonable and necessary in order to provide the required intensity of service to ensure the patient's safety. The patient's presenting symptoms, physical exam findings, and initial radiographic and laboratory data in the context of their chronic comorbidities is felt to place them at high risk for further clinical deterioration. Furthermore, it is not anticipated that the patient will be medically stable for discharge from the hospital within 2 midnights of admission.   * I certify that at the point of admission it is my clinical judgment that the patient will require inpatient hospital care spanning beyond 2 midnights from the point of admission due to high intensity of service, high risk for further deterioration and high frequency of surveillance required. MD [facility] Pager [phone]  If 7PM-7AM, please contact night-coverage [email or web address] Password [email or web address]  11/30/2020, 11:26 PM

## 2020-11-30 NOTE — ED Notes (Signed)
Pt stated that she has recently started eliquis 3wks ago; pt has seen dark black stool for the past day and her abdomen is slightly bruised

## 2020-11-30 NOTE — ED Triage Notes (Signed)
Pt comes via EMs with c/o fall. Pt fell to her knees and then got back in bed. Pt denies any pain. Pt does have edema to bilateral legs but that is being addressed by PCP.

## 2020-11-30 NOTE — ED Provider Notes (Signed)
Emergency Medicine Provider Triage Evaluation Note  Jane Campbell , a 87 y.o. female  was evaluated in triage.  Pt complains of generalized weakness with episode where her legs gave out and she was able to lower herself to the ground. She has since developed crampy pain in her abdomen and had a dark bowel movement while in the waiting room. She is currently anticoagulated on Eliquis for atrial fibrillation. She denies any pain related to the fall.  Review of Systems  Positive: Generalized weakness, melena, abdominal cramping. Negative: Headache, neck pain, extremity pain, nausea, vomiting, numbness, weakness.  Physical Exam  LMP  (LMP Unknown)  Gen:   Awake, no distress Resp:  Normal effort, CTAB MSK:   Moves extremities without difficulty, no upper or lower extremity bony tenderness to palpation. No midline cervical spine tenderness. Other:  No abdominal tenderness to palpation.  Medical Decision Making  Medically screening exam initiated at 9:02 PM.  Appropriate orders placed.  Albena Comes was informed that the remainder of the evaluation will be completed by another provider, this initial triage assessment does not replace that evaluation, and the importance of remaining in the ED until their evaluation is complete.  Patient presenting with generalized weakness leading to fall where she was able to lower herself to the ground.  No evidence of trauma to her head or neck, no evidence of trauma to her trunk or extremities as well.  She does report melena while here in the waiting room and GI bleed could be contributing to her generalized weakness, especially given she is anticoagulated on Eliquis.  We will check EKG and labs, patient remains hemodynamically stable at this time.   Chesley Noon, MD 11/30/20 2120

## 2020-11-30 NOTE — ED Provider Notes (Signed)
[facility] Emergency Department Provider Note  ____________________________________________  Time seen: Approximately 10:42 PM  I have reviewed the triage vital signs and the nursing notes.   HISTORY  Chief Complaint Fall    HPI Jane Campbell is a 87 y.o. female with a history of atrial fibrillation diabetes hypertension, on Eliquis who comes ED complaining of nausea, crampy abdominal pain, and rectal bleeding that started today.  She is also had generalized weakness and profound fatigue.  Earlier today when she was trying to walk out of the bedroom she felt too weak to hold her self up and had to lowered to the ground.  She denies a fall or syncope.  No chest pain or shortness of breath.  Pain is nonradiating, no aggravating or alleviating factors.  Denies trauma.  No fever    Past Medical History:  Diagnosis Date   A-fib [facility])    Coronary artery disease    Diabetes mellitus without complication (HCC)    Hypertension      Patient Active Problem List   Diagnosis Date Noted   Atrial fibrillation with RVR (HCC) 11/03/2020   Essential hypertension 11/03/2020   Pyuria 11/03/2020   Hyperlipidemia 11/03/2020   CAD (coronary artery disease) 11/03/2020     Past Surgical History:  Procedure Laterality Date   ABDOMINAL HYSTERECTOMY     APPENDECTOMY     Left kidney Removed       Prior to Admission medications   Medication Sig Start Date End Date Taking? Authorizing Provider  acetaminophen (TYLENOL) 325 MG tablet Take 650 mg by mouth 3 (three) times daily as needed for mild pain.    [provider]  albuterol (VENTOLIN HFA) 108 (90 Base) MCG/ACT inhaler Inhale 2 puffs into the lungs every 6 (six) hours as needed for wheezing or shortness of breath.    [provider]  aspirin EC 81 MG tablet Take 81 mg by mouth daily. Swallow whole.    [provider]  atorvastatin (LIPITOR) 10 MG tablet Take 10 mg by mouth daily.     [provider]  diltiazem (CARDIZEM CD) 120 MG 24 hr capsule Take 1 capsule (120 mg total) by mouth daily. 11/06/20 12/06/20  Delfino Lovett, MD  fluticasone (FLONASE) 50 MCG/ACT nasal spray Place 2 sprays into both nostrils daily as needed for allergies or rhinitis.    [provider]  levothyroxine (SYNTHROID) 125 MCG tablet Take 1 tablet (125 mcg total) by mouth daily before breakfast. 11/06/20 12/06/20  Delfino Lovett, MD  loperamide (IMODIUM) 2 MG capsule Take 2 mg by mouth 4 (four) times daily as needed for diarrhea or loose stools.    [provider]  metoprolol succinate (TOPROL-XL) 25 MG 24 hr tablet Take 37.5 mg by mouth 2 (two) times daily.    [provider]  Multiple Vitamins-Minerals (MULTIVITAMIN WITH MINERALS) tablet Take 1 tablet by mouth daily.    [provider]  ondansetron (ZOFRAN) 4 MG tablet Take 4 mg by mouth 2 (two) times daily as needed for nausea or vomiting.    [provider]     Allergies Penicillins   No family history on file.  Social History Social History   Tobacco Use   Smoking status: Former    Types: Cigarettes    Quit date: 02/07/1962    Years since quitting: 58.8   Smokeless tobacco: Former  Substance Use Topics   Alcohol use: Not Currently   Drug use: Never    Review of  Systems  Constitutional:   No fever or chills.  ENT:   No sore throat. No rhinorrhea. Cardiovascular:   No chest pain or syncope. Respiratory:   No dyspnea or cough. Gastrointestinal:   Positive as above for abdominal pain and rectal bleeding.  Musculoskeletal:   Negative for focal pain or swelling All other systems reviewed and are negative except as documented above in ROS and HPI.  ____________________________________________   PHYSICAL EXAM:  VITAL SIGNS: ED Triage Vitals  Enc Vitals Group     BP 11/30/20 2150 121/90     Pulse Rate 11/30/20 2150 (!) 150     Resp 11/30/20 2150 (!) 22     Temp --      Temp src --       SpO2 11/30/20 2150 98 %     Weight --      Height --      Head Circumference --      Peak Flow --      Pain Score 11/30/20 1837 0     Pain Loc --      Pain Edu? --      Excl. in GC? --     Vital signs reviewed, nursing assessments reviewed.   Constitutional:   Alert and oriented. Non-toxic appearance. Eyes:   Conjunctivae are normal. EOMI. PERRL. ENT      Head:   Normocephalic and atraumatic.      Nose:   Wearing a mask.      Mouth/Throat:   Wearing a mask.      Neck:   No meningismus. Full ROM. Hematological/Lymphatic/Immunilogical:   No cervical lymphadenopathy. Cardiovascular:   Irregularly irregular rhythm, heart rate 130. Symmetric bilateral radial and DP pulses.  No murmurs. Cap refill less than 2 seconds. Respiratory:   Normal respiratory effort without tachypnea/retractions. Breath sounds are clear and equal bilaterally. No wheezes/rales/rhonchi. Gastrointestinal:   Soft with epigastric tenderness. Non distended. There is no CVA tenderness.  No rebound, rigidity, or guarding.  Underwear is soiled with maroon melanotic stool. Genitourinary:   deferred Musculoskeletal:   Normal range of motion in all extremities. No joint effusions.  No lower extremity tenderness.  No edema. Neurologic:   Normal speech and language.  Motor grossly intact. No acute focal neurologic deficits are appreciated.  Skin:    Skin is warm, dry and intact. No rash noted.  No petechiae, purpura, or bullae.  ____________________________________________    LABS (pertinent positives/negatives) (all labs ordered are listed, but only abnormal results are displayed) Labs Reviewed  CBC WITH DIFFERENTIAL/PLATELET - Abnormal; Notable for the following components:      Result Value   WBC 12.9 (*)    RBC 3.37 (*)    Hemoglobin 11.2 (*)    HCT 33.6 (*)    Neutro Abs 10.4 (*)    Abs Immature Granulocytes 0.09 (*)    All other components within normal limits  COMPREHENSIVE METABOLIC PANEL -  Abnormal; Notable for the following components:   Potassium 3.3 (*)    Glucose, Bld 168 (*)    BUN 56 (*)    Creatinine, Ser 1.05 (*)    Calcium 8.5 (*)    Total Protein 5.2 (*)    Albumin 3.1 (*)    ALT 48 (*)    Total Bilirubin 1.4 (*)    GFR, Estimated 50 (*)    All other components within normal limits  LIPASE, BLOOD  PROTIME-INR  APTT  HEPARIN LEVEL (UNFRACTIONATED)  TYPE AND SCREEN  TROPONIN I (HIGH SENSITIVITY)   ____________________________________________   EKG  Interpreted by me Atrial fibrillation rate of 155.  Normal axis and intervals.  Poor R wave progression.  Normal ST segments and T waves.  No ischemic changes.  ____________________________________________    RADIOLOGY  No results found.  ____________________________________________   PROCEDURES Procedures  ____________________________________________    CLINICAL IMPRESSION / ASSESSMENT AND PLAN / ED COURSE  Medications ordered in the ED: Medications  sodium chloride 0.9 % bolus 1,000 mL (has no administration in time range)  pantoprazole (PROTONIX) injection 40 mg (has no administration in time range)  iohexol (OMNIPAQUE) 350 MG/ML injection 100 mL (has no administration in time range)    Pertinent labs & imaging results that were available during my care of the patient were reviewed by me and considered in my medical decision making (see chart for details).  Jane Campbell was evaluated in Emergency Department on 11/30/2020 for the symptoms described in the history of present illness. She was evaluated in the context of the global COVID-19 pandemic, which necessitated consideration that the patient might be at risk for infection with the SARS-CoV-2 virus that causes COVID-19. Institutional protocols and algorithms that pertain to the evaluation of patients at risk for COVID-19 are in a state of rapid change based on information released by regulatory bodies including the CDC and federal and state  organizations. These policies and algorithms were followed during the patient's care in the ED.   Patient presents with clinically apparent GI bleed, on Eliquis.  Blood pressure stable but she is quite symptomatic.  She has been compliant with her metoprolol, and I worried that the tachycardia is compensatory due to acute blood loss.  Will give IV fluid bolus for now, obtain CT angiogram, will need monitoring of vital signs and hemoglobin level.  Case discussed with the hospitalist for further management.  Counseled patient on possible need for blood transfusion if she develops hypotension or worsening symptoms or acute blood loss anemia.  Plan discussed with patient's family member at bedside as well.      ____________________________________________   FINAL CLINICAL IMPRESSION(S) / ED DIAGNOSES    Final diagnoses:  Acute GI bleeding  Atrial fibrillation, unspecified type (HCC)  Anticoagulated     ED Discharge Orders     None       Portions of this note were generated with dragon dictation software. Dictation errors may occur despite best attempts at proofreading.    Sharman Cheek, MD 11/30/20 [phone]

## 2020-12-01 DIAGNOSIS — R609 Edema, unspecified: Secondary | ICD-10-CM | POA: Diagnosis not present

## 2020-12-01 DIAGNOSIS — K922 Gastrointestinal hemorrhage, unspecified: Secondary | ICD-10-CM

## 2020-12-01 DIAGNOSIS — I4891 Unspecified atrial fibrillation: Secondary | ICD-10-CM | POA: Diagnosis not present

## 2020-12-01 DIAGNOSIS — I2510 Atherosclerotic heart disease of native coronary artery without angina pectoris: Secondary | ICD-10-CM | POA: Diagnosis not present

## 2020-12-01 LAB — COMPREHENSIVE METABOLIC PANEL
ALT: 35 U/L (ref 0–44)
AST: 18 U/L (ref 15–41)
Albumin: 2.6 g/dL — ABNORMAL LOW (ref 3.5–5.0)
Alkaline Phosphatase: 46 U/L (ref 38–126)
Anion gap: 9 (ref 5–15)
BUN: 56 mg/dL — ABNORMAL HIGH (ref 8–23)
CO2: 29 mmol/L (ref 22–32)
Calcium: 7.7 mg/dL — ABNORMAL LOW (ref 8.9–10.3)
Chloride: 101 mmol/L (ref 98–111)
Creatinine, Ser: 0.91 mg/dL (ref 0.44–1.00)
GFR, Estimated: 60 mL/min — ABNORMAL LOW (ref >60)
Glucose, Bld: 123 mg/dL — ABNORMAL HIGH (ref 70–99)
Potassium: 2.8 mmol/L — ABNORMAL LOW (ref 3.5–5.1)
Sodium: 139 mmol/L (ref 135–145)
Total Bilirubin: 1.2 mg/dL (ref 0.3–1.2)
Total Protein: 4.2 g/dL — ABNORMAL LOW (ref 6.5–8.1)

## 2020-12-01 LAB — CBC
HCT: 27.4 % — ABNORMAL LOW (ref 36.0–46.0)
HCT: 28 % — ABNORMAL LOW (ref 36.0–46.0)
HCT: 28.5 % — ABNORMAL LOW (ref 36.0–46.0)
HCT: 28.8 % — ABNORMAL LOW (ref 36.0–46.0)
Hemoglobin: 8.7 g/dL — ABNORMAL LOW (ref 12.0–15.0)
Hemoglobin: 9 g/dL — ABNORMAL LOW (ref 12.0–15.0)
Hemoglobin: 9 g/dL — ABNORMAL LOW (ref 12.0–15.0)
Hemoglobin: 9.4 g/dL — ABNORMAL LOW (ref 12.0–15.0)
MCH: 31.9 pg (ref 26.0–34.0)
MCH: 32.4 pg (ref 26.0–34.0)
MCH: 32.8 pg (ref 26.0–34.0)
MCH: 32.9 pg (ref 26.0–34.0)
MCHC: 31.6 g/dL (ref 30.0–36.0)
MCHC: 31.8 g/dL (ref 30.0–36.0)
MCHC: 32.1 g/dL (ref 30.0–36.0)
MCHC: 32.6 g/dL (ref 30.0–36.0)
MCV: 100.4 fL — ABNORMAL HIGH (ref 80.0–100.0)
MCV: 100.7 fL — ABNORMAL HIGH (ref 80.0–100.0)
MCV: 100.7 fL — ABNORMAL HIGH (ref 80.0–100.0)
MCV: 104 fL — ABNORMAL HIGH (ref 80.0–100.0)
Platelets: 152 10*3/uL (ref 150–400)
Platelets: 157 10*3/uL (ref 150–400)
Platelets: 159 10*3/uL (ref 150–400)
Platelets: 166 10*3/uL (ref 150–400)
RBC: 2.73 MIL/uL — ABNORMAL LOW (ref 3.87–5.11)
RBC: 2.74 MIL/uL — ABNORMAL LOW (ref 3.87–5.11)
RBC: 2.78 MIL/uL — ABNORMAL LOW (ref 3.87–5.11)
RBC: 2.86 MIL/uL — ABNORMAL LOW (ref 3.87–5.11)
RDW: 14.8 % (ref 11.5–15.5)
RDW: 14.8 % (ref 11.5–15.5)
RDW: 15.3 % (ref 11.5–15.5)
RDW: 15.5 % (ref 11.5–15.5)
WBC: 10.1 10*3/uL (ref 4.0–10.5)
WBC: 10.7 10*3/uL — ABNORMAL HIGH (ref 4.0–10.5)
WBC: 12.2 10*3/uL — ABNORMAL HIGH (ref 4.0–10.5)
WBC: 12.8 10*3/uL — ABNORMAL HIGH (ref 4.0–10.5)
nRBC: 0 % (ref 0.0–0.2)
nRBC: 0 % (ref 0.0–0.2)
nRBC: 0 % (ref 0.0–0.2)
nRBC: 0 % (ref 0.0–0.2)

## 2020-12-01 LAB — RESP PANEL BY RT-PCR (FLU A&B, COVID) ARPGX2
Influenza A by PCR: NEGATIVE
Influenza B by PCR: NEGATIVE
SARS Coronavirus 2 by RT PCR: NEGATIVE

## 2020-12-01 LAB — TYPE AND SCREEN
ABO/RH(D): A POS
Antibody Screen: NEGATIVE

## 2020-12-01 LAB — MAGNESIUM: Magnesium: 1.6 mg/dL — ABNORMAL LOW (ref 1.7–2.4)

## 2020-12-01 LAB — TSH: TSH: 1.329 u[IU]/mL (ref 0.350–4.500)

## 2020-12-01 LAB — PHOSPHORUS: Phosphorus: 2.6 mg/dL (ref 2.5–4.6)

## 2020-12-01 LAB — TROPONIN I (HIGH SENSITIVITY): Troponin I (High Sensitivity): 15 ng/L (ref <18)

## 2020-12-01 LAB — T4, FREE: Free T4: 1.73 ng/dL — ABNORMAL HIGH (ref 0.61–1.12)

## 2020-12-01 MED ORDER — DILTIAZEM HCL-DEXTROSE 125-5 MG/125ML-% IV SOLN (PREMIX)
5.0000 mg/h | INTRAVENOUS | Status: DC
  Administered 2020-12-01: 5 mg/h via INTRAVENOUS
  Filled 2020-12-01: qty 125

## 2020-12-01 MED ORDER — PANTOPRAZOLE SODIUM 40 MG IV SOLR
40.0000 mg | Freq: Two times a day (BID) | INTRAVENOUS | Status: DC
  Administered 2020-12-04 – 2020-12-05 (×4): 40 mg via INTRAVENOUS
  Filled 2020-12-01 (×4): qty 40

## 2020-12-01 MED ORDER — AMIODARONE LOAD VIA INFUSION
150.0000 mg | Freq: Once | INTRAVENOUS | Status: AC
  Administered 2020-12-01: 150 mg via INTRAVENOUS
  Filled 2020-12-01: qty 83.34

## 2020-12-01 MED ORDER — FLUTICASONE PROPIONATE 50 MCG/ACT NA SUSP
2.0000 | Freq: Every day | NASAL | Status: DC | PRN
  Filled 2020-12-01: qty 16

## 2020-12-01 MED ORDER — POTASSIUM CHLORIDE CRYS ER 20 MEQ PO TBCR
40.0000 meq | EXTENDED_RELEASE_TABLET | Freq: Two times a day (BID) | ORAL | Status: AC
  Administered 2020-12-01 (×2): 40 meq via ORAL
  Filled 2020-12-01 (×2): qty 2

## 2020-12-01 MED ORDER — SODIUM CHLORIDE 0.9 % IV SOLN
2.0000 g | INTRAVENOUS | Status: DC
  Administered 2020-12-01 – 2020-12-04 (×3): 2 g via INTRAVENOUS
  Filled 2020-12-01: qty 20
  Filled 2020-12-01: qty 2
  Filled 2020-12-01: qty 20
  Filled 2020-12-01: qty 2
  Filled 2020-12-01: qty 20

## 2020-12-01 MED ORDER — ONDANSETRON HCL 4 MG/2ML IJ SOLN
4.0000 mg | Freq: Four times a day (QID) | INTRAMUSCULAR | Status: DC | PRN
  Administered 2020-12-01: 4 mg via INTRAVENOUS
  Filled 2020-12-01: qty 2

## 2020-12-01 MED ORDER — AMIODARONE HCL IN DEXTROSE 360-4.14 MG/200ML-% IV SOLN
30.0000 mg/h | INTRAVENOUS | Status: DC
  Administered 2020-12-01 – 2020-12-03 (×4): 30 mg/h via INTRAVENOUS
  Filled 2020-12-01 (×4): qty 200

## 2020-12-01 MED ORDER — LEVOTHYROXINE SODIUM 50 MCG PO TABS
125.0000 ug | ORAL_TABLET | Freq: Every day | ORAL | Status: DC
  Administered 2020-12-02 – 2020-12-21 (×19): 125 ug via ORAL
  Filled 2020-12-01 (×19): qty 1

## 2020-12-01 MED ORDER — ADULT MULTIVITAMIN W/MINERALS CH
1.0000 | ORAL_TABLET | Freq: Every day | ORAL | Status: DC
  Administered 2020-12-03 – 2020-12-21 (×19): 1 via ORAL
  Filled 2020-12-01 (×20): qty 1

## 2020-12-01 MED ORDER — DILTIAZEM LOAD VIA INFUSION
10.0000 mg | Freq: Once | INTRAVENOUS | Status: AC
  Administered 2020-12-01: 10 mg via INTRAVENOUS
  Filled 2020-12-01: qty 10

## 2020-12-01 MED ORDER — ATORVASTATIN CALCIUM 10 MG PO TABS
10.0000 mg | ORAL_TABLET | Freq: Every day | ORAL | Status: DC
  Administered 2020-12-01 – 2020-12-20 (×20): 10 mg via ORAL
  Filled 2020-12-01 (×22): qty 1

## 2020-12-01 MED ORDER — SODIUM CHLORIDE 0.9 % IV SOLN: INTRAVENOUS | Status: DC

## 2020-12-01 MED ORDER — AMIODARONE HCL IN DEXTROSE 360-4.14 MG/200ML-% IV SOLN
60.0000 mg/h | INTRAVENOUS | Status: DC
  Administered 2020-12-01: 60 mg/h via INTRAVENOUS
  Filled 2020-12-01: qty 200

## 2020-12-01 MED ORDER — PANTOPRAZOLE 80MG IVPB - SIMPLE MED
80.0000 mg | Freq: Once | INTRAVENOUS | Status: AC
  Administered 2020-12-01: 80 mg via INTRAVENOUS
  Filled 2020-12-01: qty 80

## 2020-12-01 MED ORDER — ALPRAZOLAM 0.25 MG PO TABS
0.2500 mg | ORAL_TABLET | Freq: Three times a day (TID) | ORAL | Status: DC | PRN
  Administered 2020-12-01 – 2020-12-20 (×14): 0.25 mg via ORAL
  Filled 2020-12-01 (×15): qty 1

## 2020-12-01 MED ORDER — ALBUTEROL SULFATE (2.5 MG/3ML) 0.083% IN NEBU
2.5000 mg | INHALATION_SOLUTION | Freq: Four times a day (QID) | RESPIRATORY_TRACT | Status: DC | PRN
  Administered 2020-12-01 – 2020-12-06 (×2): 2.5 mg via RESPIRATORY_TRACT
  Filled 2020-12-01 (×3): qty 3

## 2020-12-01 MED ORDER — POTASSIUM CHLORIDE 10 MEQ/100ML IV SOLN
10.0000 meq | INTRAVENOUS | Status: AC
  Administered 2020-12-01 (×4): 10 meq via INTRAVENOUS
  Filled 2020-12-01 (×3): qty 100

## 2020-12-01 MED ORDER — ONDANSETRON HCL 4 MG PO TABS: 4.0000 mg | ORAL_TABLET | Freq: Four times a day (QID) | ORAL | Status: DC | PRN

## 2020-12-01 MED ORDER — PANTOPRAZOLE INFUSION (NEW) - SIMPLE MED
8.0000 mg/h | INTRAVENOUS | Status: DC
  Administered 2020-12-01 – 2020-12-03 (×6): 8 mg/h via INTRAVENOUS
  Filled 2020-12-01: qty 100
  Filled 2020-12-01: qty 80
  Filled 2020-12-01 (×2): qty 100
  Filled 2020-12-01: qty 80
  Filled 2020-12-01: qty 100
  Filled 2020-12-01: qty 80

## 2020-12-01 NOTE — ED Notes (Signed)
FIRST CONTACT: This RN assumes care of patient, Amiodarone noted to have no filter. Tubing changed. Pt repositioned, new PIV started as AC PIV noted to be occluded r/t patient position.   Pt given meal tray, set up with bed side table.   Call light placed in reach - Denies other needs at this time.

## 2020-12-01 NOTE — ED Notes (Signed)
Pt cleaned up of BM. Dark red liquid stool noted in brief. New brief placed. Pt repositioned in bed. No S/S of distress noted.

## 2020-12-01 NOTE — ED Notes (Signed)
Pt alert and oriented. Denies any pain at this time. Call bell in reach. Pt states she is lonely in room. Only has pain when blood pressure cuff inflates. Denies any other needs at this time.

## 2020-12-01 NOTE — Progress Notes (Signed)
PROGRESS NOTE    Jane Campbell  HKV:[id number] DOB: [birth year] DOA: 11/30/2020 PCP: Pcp, No   Brief Narrative: The patient is a 87 year old female with a past medical history significant for but not limited to atrial fibrillation, coronary artery disease, diet-controlled diabetes mellitus type 2, essential hypertension, bilateral lower extremity edema as well as other comorbidities who was recently started on anticoagulation with apixaban on November 09, 2020.  Patient has a solitary kidney and was actually being taken off diuretics for hypotension and possibly rising creatinine.  She had nausea, crampy abdominal pain and had rectal bleeding that started initially melena and now with bright red blood.  She had weakness at home and fell and had to lower herself to the ground with no evidence of syncope.  She had generalized weakness and hemoglobin is trending down she is tachycardic.  Stool guaiac was positive.  She was admitted to hospital for acute GI bleed secondary to Eliquis that started 2 weeks ago.  She underwent a CT angio bleed today with no evidence of acute bleeding noted on the scan.  She was admitted for a GI bleed and she was placed on Protonix.  GI will be consulted for further evaluation recommendations.  Patient is persistently A. fib with RVR so we will start her on a Cardizem drip and consult cardiology for further evaluation recommendations  Assessment & Plan:   Principal Problem:   GI bleed Active Problems:   Atrial fibrillation with RVR (HCC)   Essential hypertension   Hyperlipidemia   CAD (coronary artery disease)   Edema   Diabetes (HCC)   Hypokalemia  Acute GI bleed in the setting of anticoagulation -Hold her apixaban for now -Initially started as melena and now is bright red blood per rectum -She has been placed on a Protonix drip -Patient has been kept n.p.o. and GI has been consulted -She is getting serial CBCs been showing that her hemoglobin is dropping -Patient's  hemoglobin/hematocrit went from 12.5/36.7 last month and now is 9.0/28.0 with MCV of 100.7; continue to trend serial hemoglobins -Continue to monitor for signs and symptoms of bleeding as is still having some dark red liquid stool -Further care per GI  Atrial fibrillation with RVR -Likely in the setting of GI bleed -Given a liter of normal saline bolus in the ED -Patient has uncontrolled heart rate so will initiate a Cardizem bolus and drip; heart rates are in the-140s to low 150s -Continue to monitor on telemetry -Hold anticoagulation and troponin level went from 18 and trended down to 15 -Cardiology has been consulted for further evaluation recommendations  Leukocytosis -In the setting of GI bleed and reactive -WBC went from 12.9 is now trended down to 10.1 -Continue to monitor for signs and symptoms of infection -Repeat serial CBCs and also repeat CBC in a.m.  Essential hypertension -Blood pressure is on the softer -At home she takes diltiazem 120 mg p.o. daily and metoprolol tartrate 50 mg p.o. twice daily for her A. fib -Hold antihypertensives in the setting of her GI bleed with potential drop in blood pressure however will need to start Cardizem drip given her uncontrolled heart rates; may need to go to IV amiodarone instead if blood pressure becomes too low -We will monitor blood pressures per protocol -Last blood pressure reading was 100/54  Hyperlipidemia -Continue with home atorvastatin 10 mg p.o. nightly   Hypokalemia -Patient's potassium has been dropping and went from 3.3 down to 2.8 -Check magnesium level -Replete with p.o. KCl 40 mEq  twice daily x2 doses as well as IV KCl 40 mg x 1 -Continue to monitor replete as necessary 80-Repeat CMP in a.m.  Hyperbilirubinemia -Mild and improved.  T bili went from 1.4 trended down to 1.2 -Likely this was reactive -Continue to monitor and trend and repeat CMP in a.m.  Hypothyroidism -TSH was checked last month on 11/03/2020  was 0.121 and repeat TSH level given her A Fib with RVR -Continue with levothyroxine 125 mcg p.o. daily  Bilateral lower extremity edema with questionable cellulitis on the left leg; could be in the setting of hypoalbuminemia -Worse on the left compared to right -She has 2+ lower extremity edema bilaterally a little bit worse on the left compared to right as she states the left one has been draining -Has been taking diuretics only 3 times a week and now off of diuretics -Compression stockings have been recommended but will treat her left leg for cellulitis -May need some diuresis but blood pressure is on the lower side -Cardiology been consulted for above -Elevate extremities  CAD -Continue with Atorvastatin 10 mg po qHS  DVT prophylaxis: SCDs Code Status: DO NOT RESUSCITATE Family Communication: No family present at bedside  Disposition Plan: Clinical improvement and clearance by specialist that she will need PT OT evaluation prior to discharge  Status is: Inpatient  Remains inpatient appropriate because: Uncontrolled heart rates and GI bleeding that needs further evaluation recommendations by specialist including cardiology and GI  Consultants:  Gastroenterology Dr. Servando Snare Cardiology Dr. Darrold Junker  Procedures: None  Antimicrobials:  Anti-infectives (From admission, onward)    None        Subjective: Seen and examined at bedside and states that she is not feeling well today and states that she is having a lot of bowel movements.  States that she feels very weak.  Does not feel her heart rate racing.  No chest pain or shortness of breath.  States her legs are swollen as her left one has been draining.  No other concerns or complaints at this time.  Objective: Vitals:   12/01/20 0445 12/01/20 0730 12/01/20 0736 12/01/20 0739  BP: 91/68 93/71    Pulse: (!) 108 (!) 114 (!) 140 (!) 114  Resp: 16 12 (!) 23 16  Temp:      TempSrc:      SpO2: 100% 100% 96% 100%     Intake/Output Summary (Last 24 hours) at 12/01/2020 1751 Last data filed at 12/01/2020 0503 Gross per 24 hour  Intake 95 ml  Output --  Net 95 ml   There were no vitals filed for this visit.  Examination: Physical Exam:  Constitutional: WN/WD overweight chronically ill-appearing Caucasian female currently in no acute distress appears a little uncomfortable Eyes: Lids and conjunctivae normal, sclerae anicteric  ENMT: External Ears, Nose appear normal.  She is hard of hearing. Mucous membranes are moist.  Neck: Appears normal, supple, no cervical masses, normal ROM, no appreciable thyromegaly; some mild JVD Respiratory: Diminished to auscultation bilaterally with coarse breath sounds, no wheezing, rales, rhonchi or crackles. Normal respiratory effort and patient is not tachypenic. No accessory muscle use.  Unlabored breathing and not wearing supplemental oxygen via nasal cannula Cardiovascular: Irregularly irregular and tachycardic rate, no murmurs / rubs / gallops.  Has 2+ lower extremity pitting edema worse on the left compared to right with some erythema on the left lower leg Abdomen: Soft, minimally-tender, distended secondary by habitus. Bowel sounds positive.  GU: Deferred. Musculoskeletal: No clubbing / cyanosis of digits/nails.  No joint deformity upper and lower extremities.  Skin: No rashes, lesions, ulcers on limited skin evaluation but does have left lower extremity erythema and swelling. No induration; Warm and dry.  Neurologic: CN 2-12 grossly intact with no focal deficits but she is hard of hearing. Romberg sign and cerebellar reflexes not assessed.  Psychiatric: Normal judgment and insight. Alert and oriented x 3. Normal mood and appropriate affect.   Data Reviewed: I have personally reviewed following labs and imaging studies  CBC: Recent Labs  Lab 11/30/20 2108 12/01/20 0019 12/01/20 0648  WBC 12.9* 12.2* 10.1  NEUTROABS 10.4*  --   --   HGB 11.2* 9.4* 9.0*   HCT 33.6* 28.8* 28.0*  MCV 99.7 100.7* 100.7*  PLT 195 166 159   Basic Metabolic Panel: Recent Labs  Lab 11/30/20 2108 12/01/20 0648  NA 137 139  K 3.3* 2.8*  CL 98 101  CO2 31 29  GLUCOSE 168* 123*  BUN 56* 56*  CREATININE 1.05* 0.91  CALCIUM 8.5* 7.7*   GFR: CrCl cannot be calculated (Unknown ideal weight.). Liver Function Tests: Recent Labs  Lab 11/30/20 2108 12/01/20 0648  AST 21 18  ALT 48* 35  ALKPHOS 59 46  BILITOT 1.4* 1.2  PROT 5.2* 4.2*  ALBUMIN 3.1* 2.6*   Recent Labs  Lab 11/30/20 2108  LIPASE 40   No results for input(s): AMMONIA in the last 168 hours. Coagulation Profile: Recent Labs  Lab 11/30/20 2108  INR 1.5*   Cardiac Enzymes: No results for input(s): CKTOTAL, CKMB, CKMBINDEX, TROPONINI in the last 168 hours. BNP (last 3 results) No results for input(s): PROBNP in the last 8760 hours. HbA1C: No results for input(s): HGBA1C in the last 72 hours. CBG: No results for input(s): GLUCAP in the last 168 hours. Lipid Profile: No results for input(s): CHOL, HDL, LDLCALC, TRIG, CHOLHDL, LDLDIRECT in the last 72 hours. Thyroid Function Tests: No results for input(s): TSH, T4TOTAL, FREET4, T3FREE, THYROIDAB in the last 72 hours. Anemia Panel: No results for input(s): VITAMINB12, FOLATE, FERRITIN, TIBC, IRON, RETICCTPCT in the last 72 hours. Sepsis Labs: No results for input(s): PROCALCITON, LATICACIDVEN in the last 168 hours.  Recent Results (from the past 240 hour(s))  Resp Panel by RT-PCR (Flu A&B, Covid) Nasopharyngeal Swab     Status: None   Collection Time: 11/30/20 10:53 PM   Specimen: Nasopharyngeal Swab; Nasopharyngeal(NP) swabs in vial transport medium  Result Value Ref Range Status   SARS Coronavirus 2 by RT PCR NEGATIVE NEGATIVE Final    Comment: (NOTE) SARS-CoV-2 target nucleic acids are NOT DETECTED.  The SARS-CoV-2 RNA is generally detectable in upper respiratory specimens during the acute phase of infection. The  lowest concentration of SARS-CoV-2 viral copies this assay can detect is 138 copies/mL. A negative result does not preclude SARS-Cov-2 infection and should not be used as the sole basis for treatment or other patient management decisions. A negative result may occur with  improper specimen collection/handling, submission of specimen other than nasopharyngeal swab, presence of viral mutation(s) within the areas targeted by this assay, and inadequate number of viral copies(<138 copies/mL). A negative result must be combined with clinical observations, patient history, and epidemiological information. The expected result is Negative.  Fact Sheet for Patients:  BloggerCourse.com  Fact Sheet for Healthcare Providers:  SeriousBroker.it  This test is no t yet approved or cleared by the [place] FDA and  has been authorized for detection and/or diagnosis of SARS-CoV-2 by FDA under an Emergency Use Authorization (  EUA). This EUA will remain  in effect (meaning this test can be used) for the duration of the COVID-19 declaration under Section 564(b)(1) of the Act, 21 U.S.C.section 360bbb-3(b)(1), unless the authorization is terminated  or revoked sooner.       Influenza A by PCR NEGATIVE NEGATIVE Final   Influenza B by PCR NEGATIVE NEGATIVE Final    Comment: (NOTE) The Xpert Xpress SARS-CoV-2/FLU/RSV plus assay is intended as an aid in the diagnosis of influenza from Nasopharyngeal swab specimens and should not be used as a sole basis for treatment. Nasal washings and aspirates are unacceptable for Xpert Xpress SARS-CoV-2/FLU/RSV testing.  Fact Sheet for Patients: BloggerCourse.com  Fact Sheet for Healthcare Providers: SeriousBroker.it  This test is not yet approved or cleared by the [place] FDA and has been authorized for detection and/or diagnosis of SARS-CoV-2 by FDA under  an Emergency Use Authorization (EUA). This EUA will remain in effect (meaning this test can be used) for the duration of the COVID-19 declaration under Section 564(b)(1) of the Act, 21 U.S.C. section 360bbb-3(b)(1), unless the authorization is terminated or revoked.  Performed at [facility], [address] [place], [place] [address]      RN Pressure Injury Documentation:     Estimated body mass index is 26.79 kg/m as calculated from the following:   Height as of 11/03/20: 5\' 3"  (1.6 m).   Weight as of 11/04/20: 68.6 kg.  Malnutrition Type:   Malnutrition Characteristics:   Nutrition Interventions:    Radiology Studies: CT ANGIO GI BLEED  Result Date: 11/30/2020 CLINICAL DATA:  GI bleed. EXAM: CTA ABDOMEN AND PELVIS WITHOUT AND WITH CONTRAST TECHNIQUE: Multidetector CT imaging of the abdomen and pelvis was performed using the standard protocol during bolus administration of intravenous contrast. Multiplanar reconstructed images and MIPs were obtained and reviewed to evaluate the vascular anatomy. CONTRAST:  12/02/2020 OMNIPAQUE IOHEXOL 350 MG/ML SOLN COMPARISON:  None. FINDINGS: VASCULAR Aorta: Moderate atherosclerotic calcification. No aneurysmal dilatation or dissection. No periaortic fluid or hematoma. There is a focal area of penetrating ulcer involving the right lateral wall of the infrarenal abdominal aorta measuring approximately 7 mm in depth. Celiac: Atherosclerotic calcification of the origin of the celiac axis. The celiac axis and its major branches are patent. SMA: Patent without evidence of aneurysm, dissection, vasculitis or significant stenosis. Renals: Atherosclerotic calcification of the origin of the right renal artery. The right renal artery remains patent. IMA: Patent without evidence of aneurysm, dissection, vasculitis or significant stenosis. Inflow: Atherosclerotic calcification of the iliac arteries. No aneurysmal dilatation or dissection. Proximal Outflow:  Bilateral common femoral and visualized portions of the superficial and profunda femoral arteries are patent without evidence of aneurysm, dissection, vasculitis or significant stenosis. Veins: No obvious venous abnormality within the limitations of this arterial phase study. Review of the MIP images confirms the above findings. NON-VASCULAR Lower chest: Partially visualized moderate right pleural effusion. No intra-abdominal free air or free fluid. Hepatobiliary: The liver is unremarkable. No intrahepatic biliary dilatation. Multiple stones. No pericholecystic fluid or evidence of acute cholecystitis by CT. Pancreas: Indeterminate faint 11 mm hypodense lesion in the distal pancreas which is not characterized on this CT, possibly a side branch IPMN. No acute inflammatory changes. No dilatation of the main pancreatic duct or gland atrophy. Spleen: Normal in size without focal abnormality. Adrenals/Urinary Tract: The right adrenal gland is unremarkable. 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 No hydronephrosis. Several small right renal cysts. The urinary bladder is  grossly unremarkable. Stomach/Bowel: Diffuse diverticulosis of the descending colon and severe sigmoid diverticulosis without active inflammatory changes. No bowel obstruction or active inflammation. There is a large hiatal hernia. No extravasation of contrast to suggest active GI bleed. Appendectomy. Lymphatic: No adenopathy. Reproductive: Hysterectomy. Other: Faint 2.3 x 1.3 cm focal area of high attenuating prominence in the right rectus muscle which may represent focal area of muscular prominence versus a small hematoma (77/5). Musculoskeletal: Osteopenia with degenerative changes of the spine and scoliosis. No acute osseous pathology. IMPRESSION: 1. No evidence of active GI bleed. 2. Severe colonic diverticulosis without active inflammatory changes. 3. Large hiatal hernia. 4. Cholelithiasis. 5. Solitary right kidney. 6.  Partially visualized moderate right pleural effusion. Electronically Signed   By: Elgie Collard M.D.   On: 11/30/2020 23:45    Scheduled Meds:  [START ON 12/04/2020] pantoprazole  40 mg Intravenous Q12H   Continuous Infusions:  pantoprazole      LOS: 1 day   Merlene Laughter, DO [facility] PAGER is on AMION  If 7PM-7AM, please contact night-coverage [email or web address]

## 2020-12-01 NOTE — Consult Note (Signed)
[name], MD [facility]  [address] [place], [place] [address] Phone: [phone] Fax : [phone]  Consultation  Referring Provider:     Dr. Mikeal Hawthorne Primary Care Physician:  Pcp, No Primary Gastroenterologist: Gentry Fitz         Reason for Consultation:     GI bleed  Date of Admission:  11/30/2020 Date of Consultation:  12/01/2020         HPI:   Jane Campbell is a 87 y.o. female with a history of A. fib who was started on Eliquis recently.  The patient had come to the ED from assisted living with abdominal cramps and nausea.  The patient also was reported to have had black stools which then turned bloody.  The patient's hemoglobin on admission was 11.2 and it went down to 9.4 with a repeat this morning of 9.0.  The patient denies any abdominal pain at the present time.  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  She had been admitted with atrial fib with rapid ventricular rate back in September and was discharged at the end of September.  She denies any history of GI bleeding in the past.  The patient had a CT angiography that did not show any sign of active bleeding.  The patient had a bowel movement this morning reported by the nurse to be dark red liquid.  The patient has been started on a Cardizem drip and cardiology has been consulted.  The patient reports that her daughter helps her make medical decisions but she states that she typically makes her own medical decisions.  Past Medical History:  Diagnosis Date   A-fib [facility])    Coronary artery disease    Diabetes mellitus without complication (HCC)    Hypertension     Past Surgical History:  Procedure Laterality Date   ABDOMINAL HYSTERECTOMY     APPENDECTOMY     Left kidney Removed      Prior to Admission medications   Medication Sig Start Date End Date Taking? Authorizing Provider  acetaminophen (TYLENOL) 325 MG tablet Take 650 mg by mouth 3 (three) times daily as needed for mild pain.   Yes  [provider]  albuterol (VENTOLIN HFA) 108 (90 Base) MCG/ACT inhaler Inhale 2 puffs into the lungs every 6 (six) hours as needed for wheezing or shortness of breath.   Yes [provider]  ALPRAZolam (XANAX) 0.25 MG tablet Take 0.25 mg by mouth 3 (three) times daily as needed for anxiety.   Yes [provider]  apixaban (ELIQUIS) 2.5 MG TABS tablet Take 2.5 mg by mouth 2 (two) times daily.   Yes [provider]  atorvastatin (LIPITOR) 10 MG tablet Take 10 mg by mouth at bedtime.   Yes [provider]  Cranberry 500 MG CAPS Take 500 mg by mouth daily.   Yes [provider]  fluticasone (FLONASE) 50 MCG/ACT nasal spray Place 2 sprays into both nostrils daily as needed for allergies or rhinitis.   Yes [provider]  furosemide (LASIX) 40 MG tablet Take 40 mg by mouth daily. 11/25/20 12/02/20 Yes [provider]  levothyroxine (SYNTHROID) 125 MCG tablet Take 1 tablet (125 mcg total) by mouth daily before breakfast. 11/06/20 12/06/20 Yes Delfino Lovett, MD  loperamide (IMODIUM) 2 MG capsule Take 2 mg by mouth 4 (four) times daily as needed for diarrhea or loose stools.   Yes [provider]  metoprolol succinate (TOPROL-XL) 50  MG 24 hr tablet Take 50 mg by mouth 2 (two) times daily.   Yes [provider]  Multiple Vitamins-Minerals (MULTIVITAMIN WITH MINERALS) tablet Take 1 tablet by mouth daily.   Yes [provider]  ondansetron (ZOFRAN) 4 MG tablet Take 4 mg by mouth 2 (two) times daily as needed for nausea or vomiting.   Yes [provider]  senna (SENOKOT) 8.6 MG tablet Take 2 tablets by mouth at bedtime.   Yes [provider]  diltiazem (CARDIZEM CD) 120 MG 24 hr capsule Take 1 capsule (120 mg total) by mouth daily. Patient not taking: No sig reported 11/06/20 12/06/20  Delfino Lovett, MD    History reviewed. No pertinent family history.   Social History   Tobacco Use   Smoking  status: Former    Types: Cigarettes    Quit date: 02/07/1962    Years since quitting: 58.8   Smokeless tobacco: Former  Substance Use Topics   Alcohol use: Not Currently   Drug use: Never    Allergies as of 11/30/2020 - Review Complete 11/30/2020  Allergen Reaction Noted   Penicillins  11/03/2020    Review of Systems:    All systems reviewed and negative except where noted in HPI.   Physical Exam:  Vital signs in last 24 hours: Temp:  [97.6 F (36.4 C)] 97.6 F (36.4 C) (10/24 2344) Pulse Rate:  [68-150] 112 (10/25 1130) Resp:  [12-31] 16 (10/25 1130) BP: (89-121)/(54-97) 89/72 (10/25 1130) SpO2:  [95 %-100 %] 100 % (10/25 1130)   General:   Pleasant, cooperative in NAD Head:  Normocephalic and atraumatic. Eyes:   No icterus.   Conjunctiva pink. PERRLA. Ears:  Normal auditory acuity. Neck:  Supple; no masses or thyroidomegaly Lungs: Respirations even and unlabored. Lungs clear to auscultation bilaterally.   No wheezes, crackles, or rhonchi.  Heart: Irregular rate and rhythm;  Without murmur, clicks, rubs or gallops Abdomen:  Soft, nondistended, nontender. Normal bowel sounds. No appreciable masses or hepatomegaly.  No rebound or guarding.  Rectal:  Not performed. Msk:  Symmetrical without gross deformities.   Extremities:  Without edema, cyanosis or clubbing. Neurologic:  Alert and oriented x3;  grossly normal neurologically. Skin:  Intact without significant lesions or rashes. Cervical Nodes:  No significant cervical adenopathy. Psych:  Alert and cooperative. Normal affect.  LAB RESULTS: Recent Labs    11/30/20 2108 12/01/20 0019 12/01/20 0648  WBC 12.9* 12.2* 10.1  HGB 11.2* 9.4* 9.0*  HCT 33.6* 28.8* 28.0*  PLT 195 166 159   BMET Recent Labs    11/30/20 2108 12/01/20 0648  NA 137 139  K 3.3* 2.8*  CL 98 101  CO2 31 29  GLUCOSE 168* 123*  BUN 56* 56*  CREATININE 1.05* 0.91  CALCIUM 8.5* 7.7*   LFT Recent Labs    12/01/20 0648  PROT 4.2*   ALBUMIN 2.6*  AST 18  ALT 35  ALKPHOS 46  BILITOT 1.2   PT/INR Recent Labs    11/30/20 2108  LABPROT 18.2*  INR 1.5*    STUDIES: CT ANGIO GI BLEED  Result Date: 11/30/2020 CLINICAL DATA:  GI bleed. EXAM: CTA ABDOMEN AND PELVIS WITHOUT AND WITH CONTRAST TECHNIQUE: Multidetector CT imaging of the abdomen and pelvis was performed using the standard protocol during bolus administration of intravenous contrast. Multiplanar reconstructed images and MIPs were obtained and reviewed to evaluate the vascular anatomy. CONTRAST:  OMNIPAQUE IOHEXOL 350 MG/ML SOLN COMPARISON:  None. FINDINGS: VASCULAR Aorta: Moderate atherosclerotic  calcification. No aneurysmal dilatation or dissection. No periaortic fluid or hematoma. There is a focal area of penetrating ulcer involving the right lateral wall of the infrarenal abdominal aorta measuring approximately 7 mm in depth. Celiac: Atherosclerotic calcification of the origin of the celiac axis. The celiac axis and its major branches are patent. SMA: Patent without evidence of aneurysm, dissection, vasculitis or significant stenosis. Renals: Atherosclerotic calcification of the origin of the right renal artery. The right renal artery remains patent. IMA: Patent without evidence of aneurysm, dissection, vasculitis or significant stenosis. Inflow: Atherosclerotic calcification of the iliac arteries. No aneurysmal dilatation or dissection. Proximal Outflow: Bilateral common femoral and visualized portions of the superficial and profunda femoral arteries are patent without evidence of aneurysm, dissection, vasculitis or significant stenosis. Veins: No obvious venous abnormality within the limitations of this arterial phase study. Review of the MIP images confirms the above findings. NON-VASCULAR Lower chest: Partially visualized moderate right pleural effusion. No intra-abdominal free air or free fluid. Hepatobiliary: The liver is unremarkable. No intrahepatic biliary  dilatation. Multiple stones. No pericholecystic fluid or evidence of acute cholecystitis by CT. Pancreas: Indeterminate faint 11 mm hypodense lesion in the distal pancreas which is not characterized on this CT, possibly a side branch IPMN. No acute inflammatory changes. No dilatation of the main pancreatic duct or gland atrophy. Spleen: Normal in size without focal abnormality. Adrenals/Urinary Tract: The right adrenal gland is unremarkable. 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 No hydronephrosis. Several small right renal cysts. The urinary bladder is grossly unremarkable. Stomach/Bowel: Diffuse diverticulosis of the descending colon and severe sigmoid diverticulosis without active inflammatory changes. No bowel obstruction or active inflammation. There is a large hiatal hernia. No extravasation of contrast to suggest active GI bleed. Appendectomy. Lymphatic: No adenopathy. Reproductive: Hysterectomy. Other: Faint 2.3 x 1.3 cm focal area of high attenuating prominence in the right rectus muscle which may represent focal area of muscular prominence versus a small hematoma (77/5). Musculoskeletal: Osteopenia with degenerative changes of the spine and scoliosis. No acute osseous pathology. IMPRESSION: 1. No evidence of active GI bleed. 2. Severe colonic diverticulosis without active inflammatory changes. 3. Large hiatal hernia. 4. Cholelithiasis. 5. Solitary right kidney. 6. Partially visualized moderate right pleural effusion. Electronically Signed   By: Elgie Collard M.D.   On: 11/30/2020 23:45      Impression / Plan:   Assessment: Principal Problem:   GI bleed Active Problems:   Atrial fibrillation with RVR (HCC)   Essential hypertension   Hyperlipidemia   CAD (coronary artery disease)   Edema   Diabetes (HCC)   Hypokalemia   Jane Campbell is a 87 y.o. y/o female with who was admitted with melena that then turned to red blood with clots per rectum.  The patient  denies any abdominal pain or NSAID use.  The patient was started on Eliquis at the beginning of October for A. fib.  The patient's hemoglobin has significantly dropped since admission but the rate of loss seems to have slowed down.  At the time of our interview today the patient's heart rate was between 120 and 140.  Plan:  The patient was spoken to about proceeding with a upper endoscopy since she has been n.p.o. but the concern of the patient and myself is the patient's heart rate peaking above 140 multiple times while I was seeing the patient.  The patient will need to be optimized by cardiology prior to any GI intervention.  With a history of  black stools and the significant drop in hemoglobin I believe a upper endoscopy would be the first thing I would recommend prior to any other GI intervention such as a colonoscopy.  The patient has already been started on a PPI.  If cleared by cardiology and rate is under control then we will proceed with a upper endoscopy either later today but more likely tomorrow.  PPI IV twice daily  Continue serial CBCs and transfuse PRN Avoid NSAIDs Maintain 2 large-bore IV lines Please page GI with any acute hemodynamic changes, or signs of active GI bleeding   Thank you for involving me in the care of this patient.      LOS: 1 day   [name], MD, [facility] 12/01/2020, 12:50 PM,  Pager [phone] 7am-5pm  Check AMION for 5pm -7am coverage and on weekends   Note: This dictation was prepared with Dragon dictation along with smaller phrase technology. Any transcriptional errors that result from this process are unintentional.

## 2020-12-01 NOTE — Consult Note (Signed)
CARDIOLOGY CONSULT NOTE               Patient ID: Jane Campbell MRN: [id number] DOB/AGE: 1926/03/21 87 y.o.  Admit date: 11/30/2020 Referring Physician Marland Mcalpine Primary Physician no PCP Primary Cardiologist [name] Reason for Consultation GI bleed, atrial fibrillation with RVR  HPI: 87 year old female referred for evaluation of GI bleed in the setting of atrial fibrillation with RVR, and recently started on Eliquis. The patient has a history of paroxysmal atrial fibrillation, hypertension, hyperlipidemia, chronic lower extremity edema, previously on diuretics, and CKD stage III. She was started on low dose Eliquis 11/09/20, and has been taking it daily. She reports that she had generalized weakness yesterday while walking in the hall at the independent living facility where she lives. She states her legs gave out without loss of consciousness. She was taken to [facility] ER, where she has had multiple dark and bloody stools.  Initial hemoglobin and hematocrit were 11.2 and 33.6, and have since dropped to 9.0 and 28 since yesterday. She denies chest pain or shortness of breath. She denies palpitations. ECG shows atrial fibrillation with RVR. Patient was started on Cardizem drip, and rates have improved to 110s. She has had asymptomatic hypotension in the ED. 2D echocardiogram on 11/04/2020 revealed normal left ventricular function with LVEF 60-65% with moderate mitral and tricuspid regurgitation.   Review of systems complete and found to be negative unless listed above     Past Medical History:  Diagnosis Date   A-fib (HCC)    Coronary artery disease    Diabetes mellitus without complication (HCC)    Hypertension     Past Surgical History:  Procedure Laterality Date   ABDOMINAL HYSTERECTOMY     APPENDECTOMY     Left kidney Removed      (Not in a hospital admission)  Social History   Socioeconomic History   Marital status: Widowed    Spouse name: Not on file   Number of children: Not  on file   Years of education: Not on file   Highest education level: Not on file  Occupational History   Not on file  Tobacco Use   Smoking status: Former    Types: Cigarettes    Quit date: 02/07/1962    Years since quitting: 58.8   Smokeless tobacco: Former  Substance and Sexual Activity   Alcohol use: Not Currently   Drug use: Never   Sexual activity: Not Currently  Other Topics Concern   Not on file  Social History Narrative   Not on file   Social Determinants of Health   Financial Resource Strain: Not on file  Food Insecurity: Not on file  Transportation Needs: Not on file  Physical Activity: Not on file  Stress: Not on file  Social Connections: Not on file  Intimate Partner Violence: Not on file    History reviewed. No pertinent family history.    Review of systems complete and found to be negative unless listed above      PHYSICAL EXAM  General: Well developed, well nourished, lying in bed in no acute distress HEENT:  Normocephalic and atramatic Neck:  No JVD.  Lungs: normal effort of breathing on room air, no crackles or wheezing Heart: irr ir without gallops or murmurs.  Msk:  no obvious deformities Extremities: bil lower extremity edema.   Neuro: Alert and oriented X 3. Psych:  Good affect, responds appropriately  Labs:   Lab Results  Component Value Date   WBC 10.1 12/01/2020  HGB 9.0 (L) 12/01/2020   HCT 28.0 (L) 12/01/2020   MCV 100.7 (H) 12/01/2020   PLT 159 12/01/2020    Recent Labs  Lab 12/01/20 0648  NA 139  K 2.8*  CL 101  CO2 29  BUN 56*  CREATININE 0.91  CALCIUM 7.7*  PROT 4.2*  BILITOT 1.2  ALKPHOS 46  ALT 35  AST 18  GLUCOSE 123*   No results found for: CKTOTAL, CKMB, CKMBINDEX, TROPONINI No results found for: CHOL No results found for: HDL No results found for: LDLCALC No results found for: TRIG No results found for: CHOLHDL No results found for: LDLDIRECT    Radiology: US Venous Img Lower Bilateral  Result  Date: 11/03/2020 CLINICAL DATA:  87 year old female with leg swelling EXAM: BILATERAL LOWER EXTREMITY VENOUS DOPPLER ULTRASOUND TECHNIQUE: Gray-scale sonography with graded compression, as well as color Doppler and duplex ultrasound were performed to evaluate the lower extremity deep venous systems from the level of the common femoral vein and including the common femoral, femoral, profunda femoral, popliteal and calf veins including the posterior tibial, peroneal and gastrocnemius veins when visible. The superficial great saphenous vein was also interrogated. Spectral Doppler was utilized to evaluate flow at rest and with distal augmentation maneuvers in the common femoral, femoral and popliteal veins. COMPARISON:  None. FINDINGS: RIGHT LOWER EXTREMITY Common Femoral Vein: No evidence of thrombus. Normal compressibility, respiratory phasicity and response to augmentation. Saphenofemoral Junction: No evidence of thrombus. Normal compressibility and flow on color Doppler imaging. Profunda Femoral Vein: No evidence of thrombus. Normal compressibility and flow on color Doppler imaging. Femoral Vein: No evidence of thrombus. Normal compressibility, respiratory phasicity and response to augmentation. Popliteal Vein: No evidence of thrombus. Normal compressibility, respiratory phasicity and response to augmentation. Calf Veins: No evidence of thrombus. Normal compressibility and flow on color Doppler imaging. Superficial Great Saphenous Vein: No evidence of thrombus. Normal compressibility and flow on color Doppler imaging. Other Findings: Anechoic fluid collection in the popliteal fossa measuring 4.4 cm x 0.9 cm x 3.4 cm LEFT LOWER EXTREMITY Common Femoral Vein: No evidence of thrombus. Normal compressibility, respiratory phasicity and response to augmentation. Saphenofemoral Junction: No evidence of thrombus. Normal compressibility and flow on color Doppler imaging. Profunda Femoral Vein: No evidence of thrombus. Normal  compressibility and flow on color Doppler imaging. Femoral Vein: No evidence of thrombus. Normal compressibility, respiratory phasicity and response to augmentation. Popliteal Vein: No evidence of thrombus. Normal compressibility, respiratory phasicity and response to augmentation. Calf Veins: No evidence of thrombus. Normal compressibility and flow on color Doppler imaging. Superficial Great Saphenous Vein: No evidence of thrombus. Normal compressibility and flow on color Doppler imaging. Other Findings: Anechoic fluid collection in the popliteal fossa measuring 7.7 cm x 2.9 cm x 1.3 cm. IMPRESSION: Sonographic survey of the bilateral lower extremities negative for DVT. Bilateral Baker's cyst Electronically Signed   By: Gilmer Mor D.O.   On: 11/03/2020 15:40   DG Chest Portable 1 View  Result Date: 11/03/2020 CLINICAL DATA:  Rapid AFib EXAM: PORTABLE CHEST 1 VIEW COMPARISON:  None. FINDINGS: Cardiac contours within normal limits for AP technique. Rounded opacity with central lucency overlying the mediastinum, likely a moderate hiatal hernia. Mild scattered linear opacities, likely atelectasis. No focal consolidation. No large pleural effusion or evidence of pneumothorax. Moderate scoliosis. IMPRESSION: No focal consolidation. Rounded opacity with central lucency overlying the mediastinum, likely a moderate hiatal hernia. Electronically Signed   By: Allegra Lai M.D.   On: 11/03/2020 14:17   ECHOCARDIOGRAM  COMPLETE  Result Date: 11/05/2020    ECHOCARDIOGRAM REPORT   Patient Name:   BRITTNY SPANGLE Date of Exam: 11/04/2020 Medical Rec #:  [id number]  Height:       63.0 in Accession #:    [id number] Weight:       151.2 lb Date of Birth:  [birth year]  BSA:          1.717 m Patient Age:    89 years   BP:           120/79 mmHg Patient Gender: F          HR:           102 bpm. Exam Location:  [facility] Procedure: 2D Echo, Cardiac Doppler and Color Doppler Indications:     I48.91 Atrial Fibrillation  History:          Patient has no prior history of Echocardiogram examinations.                  CAD, Arrythmias:Atrial Fibrillation; Risk Factors:Hypertension                  and Diabetes.  Sonographer:     Daphine Deutscher RDCS Referring Phys:  [id number] VIPUL [facility] Diagnosing Phys: Marcina Millard MD IMPRESSIONS  1. Left ventricular ejection fraction, by estimation, is 60 to 65%. The left ventricle has normal function. The left ventricle has no regional wall motion abnormalities. There is moderate left ventricular hypertrophy. Left ventricular diastolic parameters are indeterminate.  2. Right ventricular systolic function is normal. The right ventricular size is normal.  3. Left atrial size was moderately dilated.  4. Right atrial size was moderately dilated.  5. The mitral valve is normal in structure. Moderate mitral valve regurgitation. No evidence of mitral stenosis.  6. Tricuspid valve regurgitation is moderate.  7. The aortic valve is normal in structure. Aortic valve regurgitation is not visualized. Mild aortic valve sclerosis is present, with no evidence of aortic valve stenosis.  8. The inferior vena cava is normal in size with greater than 50% respiratory variability, suggesting right atrial pressure of 3 mmHg. FINDINGS  Left Ventricle: Left ventricular ejection fraction, by estimation, is 60 to 65%. The left ventricle has normal function. The left ventricle has no regional wall motion abnormalities. The left ventricular internal cavity size was normal in size. There is  moderate left ventricular hypertrophy. Left ventricular diastolic parameters are indeterminate. Right Ventricle: The right ventricular size is normal. No increase in right ventricular wall thickness. Right ventricular systolic function is normal. Left Atrium: Left atrial size was moderately dilated. Right Atrium: Right atrial size was moderately dilated. Pericardium: There is no evidence of pericardial effusion. Mitral Valve: The mitral valve is  normal in structure. Moderate mitral valve regurgitation. No evidence of mitral valve stenosis. Tricuspid Valve: The tricuspid valve is normal in structure. Tricuspid valve regurgitation is moderate . No evidence of tricuspid stenosis. Aortic Valve: The aortic valve is normal in structure. Aortic valve regurgitation is not visualized. Mild aortic valve sclerosis is present, with no evidence of aortic valve stenosis. Pulmonic Valve: The pulmonic valve was normal in structure. Pulmonic valve regurgitation is not visualized. No evidence of pulmonic stenosis. Aorta: The aortic root is normal in size and structure. Venous: The inferior vena cava is normal in size with greater than 50% respiratory variability, suggesting right atrial pressure of 3 mmHg. IAS/Shunts: No atrial level shunt detected by color flow Doppler.  LEFT VENTRICLE PLAX 2D LVIDd:  3.70 cm LVIDs:         2.30 cm LV PW:         1.00 cm LV IVS:        1.00 cm LVOT diam:     1.90 cm LV SV:         47 LV SV Index:   27 LVOT Area:     2.84 cm  RIGHT VENTRICLE             IVC RV Basal diam:  3.20 cm     IVC diam: 1.80 cm RV S prime:     11.80 cm/s TAPSE (M-mode): 2.2 cm LEFT ATRIUM             Index       RIGHT ATRIUM           Index LA diam:        4.00 cm 2.33 cm/m  RA Area:     21.30 cm LA Vol (A2C):   53.3 ml 31.04 ml/m RA Volume:   66.50 ml  38.73 ml/m LA Vol (A4C):   82.7 ml 48.16 ml/m LA Biplane Vol: 69.4 ml 40.42 ml/m  AORTIC VALVE LVOT Vmax:   85.60 cm/s LVOT Vmean:  64.700 cm/s LVOT VTI:    0.166 m  AORTA Ao Root diam: 3.00 cm MV E velocity: 150.33 cm/s  TRICUSPID VALVE                             TR Peak grad:   27.2 mmHg                             TR Vmax:        261.00 cm/s                              SHUNTS                             Systemic VTI:  0.17 m                             Systemic Diam: 1.90 cm Marcina Millard MD Electronically signed by Marcina Millard MD Signature Date/Time: 11/05/2020/7:14:53 AM    Final     CT ANGIO GI BLEED  Result Date: 11/30/2020 CLINICAL DATA:  GI bleed. EXAM: CTA ABDOMEN AND PELVIS WITHOUT AND WITH CONTRAST TECHNIQUE: Multidetector CT imaging of the abdomen and pelvis was performed using the standard protocol during bolus administration of intravenous contrast. Multiplanar reconstructed images and MIPs were obtained and reviewed to evaluate the vascular anatomy. CONTRAST:  OMNIPAQUE IOHEXOL 350 MG/ML SOLN COMPARISON:  None. FINDINGS: VASCULAR Aorta: Moderate atherosclerotic calcification. No aneurysmal dilatation or dissection. No periaortic fluid or hematoma. There is a focal area of penetrating ulcer involving the right lateral wall of the infrarenal abdominal aorta measuring approximately 7 mm in depth. Celiac: Atherosclerotic calcification of the origin of the celiac axis. The celiac axis and its major branches are patent. SMA: Patent without evidence of aneurysm, dissection, vasculitis or significant stenosis. Renals: Atherosclerotic calcification of the origin of the right renal artery. The right renal artery remains patent. IMA: Patent without evidence of aneurysm, dissection, vasculitis or significant stenosis. Inflow: Atherosclerotic calcification of the iliac  arteries. No aneurysmal dilatation or dissection. Proximal Outflow: Bilateral common femoral and visualized portions of the superficial and profunda femoral arteries are patent without evidence of aneurysm, dissection, vasculitis or significant stenosis. Veins: No obvious venous abnormality within the limitations of this arterial phase study. Review of the MIP images confirms the above findings. NON-VASCULAR Lower chest: Partially visualized moderate right pleural effusion. No intra-abdominal free air or free fluid. Hepatobiliary: The liver is unremarkable. No intrahepatic biliary dilatation. Multiple stones. No pericholecystic fluid or evidence of acute cholecystitis by CT. Pancreas: Indeterminate faint 11 mm hypodense  lesion in the distal pancreas which is not characterized on this CT, possibly a side branch IPMN. No acute inflammatory changes. No dilatation of the main pancreatic duct or gland atrophy. Spleen: Normal in size without focal abnormality. Adrenals/Urinary Tract: The right adrenal gland is unremarkable. 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 No hydronephrosis. Several small right renal cysts. The urinary bladder is grossly unremarkable. Stomach/Bowel: Diffuse diverticulosis of the descending colon and severe sigmoid diverticulosis without active inflammatory changes. No bowel obstruction or active inflammation. There is a large hiatal hernia. No extravasation of contrast to suggest active GI bleed. Appendectomy. Lymphatic: No adenopathy. Reproductive: Hysterectomy. Other: Faint 2.3 x 1.3 cm focal area of high attenuating prominence in the right rectus muscle which may represent focal area of muscular prominence versus a small hematoma (77/5). Musculoskeletal: Osteopenia with degenerative changes of the spine and scoliosis. No acute osseous pathology. IMPRESSION: 1. No evidence of active GI bleed. 2. Severe colonic diverticulosis without active inflammatory changes. 3. Large hiatal hernia. 4. Cholelithiasis. 5. Solitary right kidney. 6. Partially visualized moderate right pleural effusion. Electronically Signed   By: Elgie Collard M.D.   On: 11/30/2020 23:45    EKG: atrial fibrillation with RVR  ASSESSMENT AND PLAN:  GI bleed with recent initiation of Eliquis, hemoglobin and hematocrit 11.2 and 33.6, and have since dropped to 9.0 and 28 since yesterday. GI has been consulted. Atrial fibrillation with RVR, with known history of paroxysmal atrial fibrillation. Currently on diltiazem drip with modest improvement of rate to 110s. Hypotension, asymptomatic, in setting of GI bleed, improved  Plan: Agree with holding Eliquis and aspirin Agree with EGD per GI Discontinue Cardizem  drip due to persistent RVR and hypotension, and switch to amiodarone bolus and drip.   Signed: Leanora Ivanoff PA-C 12/01/2020, 12:42 PM

## 2020-12-01 NOTE — ED Notes (Signed)
Pt's clear liquids placed in cups with straw so that patient may consume with ease. Repositioned for continuation of meal.

## 2020-12-02 ENCOUNTER — Encounter
Admission: EM | Disposition: A | Discharge status: Skilled Nursing Facility | Payer: Self-pay | Source: Home / Self Care | Attending: Internal Medicine

## 2020-12-02 ENCOUNTER — Other Ambulatory Visit: Payer: Self-pay

## 2020-12-02 ENCOUNTER — Inpatient Hospital Stay: Payer: Medicare Other | Admitting: Anesthesiology

## 2020-12-02 ENCOUNTER — Encounter: Payer: Self-pay | Admitting: Internal Medicine

## 2020-12-02 DIAGNOSIS — I2510 Atherosclerotic heart disease of native coronary artery without angina pectoris: Secondary | ICD-10-CM | POA: Diagnosis not present

## 2020-12-02 DIAGNOSIS — R609 Edema, unspecified: Secondary | ICD-10-CM | POA: Diagnosis not present

## 2020-12-02 DIAGNOSIS — K922 Gastrointestinal hemorrhage, unspecified: Secondary | ICD-10-CM | POA: Diagnosis not present

## 2020-12-02 DIAGNOSIS — I4891 Unspecified atrial fibrillation: Secondary | ICD-10-CM | POA: Diagnosis not present

## 2020-12-02 HISTORY — PX: ESOPHAGOGASTRODUODENOSCOPY (EGD) WITH PROPOFOL: SHX5813

## 2020-12-02 LAB — CBC WITH DIFFERENTIAL/PLATELET
Abs Immature Granulocytes: 0.08 10*3/uL — ABNORMAL HIGH (ref 0.00–0.07)
Basophils Absolute: 0 10*3/uL (ref 0.0–0.1)
Basophils Relative: 0 %
Eosinophils Absolute: 0.1 10*3/uL (ref 0.0–0.5)
Eosinophils Relative: 1 %
HCT: 29.6 % — ABNORMAL LOW (ref 36.0–46.0)
Hemoglobin: 9.4 g/dL — ABNORMAL LOW (ref 12.0–15.0)
Immature Granulocytes: 1 %
Lymphocytes Relative: 10 %
Lymphs Abs: 1.2 10*3/uL (ref 0.7–4.0)
MCH: 32.2 pg (ref 26.0–34.0)
MCHC: 31.8 g/dL (ref 30.0–36.0)
MCV: 101.4 fL — ABNORMAL HIGH (ref 80.0–100.0)
Monocytes Absolute: 0.7 10*3/uL (ref 0.1–1.0)
Monocytes Relative: 6 %
Neutro Abs: 9.4 10*3/uL — ABNORMAL HIGH (ref 1.7–7.7)
Neutrophils Relative %: 82 %
Platelets: 147 10*3/uL — ABNORMAL LOW (ref 150–400)
RBC: 2.92 MIL/uL — ABNORMAL LOW (ref 3.87–5.11)
RDW: 15.9 % — ABNORMAL HIGH (ref 11.5–15.5)
WBC: 11.5 10*3/uL — ABNORMAL HIGH (ref 4.0–10.5)
nRBC: 0 % (ref 0.0–0.2)

## 2020-12-02 LAB — COMPREHENSIVE METABOLIC PANEL
ALT: 34 U/L (ref 0–44)
AST: 21 U/L (ref 15–41)
Albumin: 3.1 g/dL — ABNORMAL LOW (ref 3.5–5.0)
Alkaline Phosphatase: 52 U/L (ref 38–126)
Anion gap: 9 (ref 5–15)
BUN: 49 mg/dL — ABNORMAL HIGH (ref 8–23)
CO2: 25 mmol/L (ref 22–32)
Calcium: 8.4 mg/dL — ABNORMAL LOW (ref 8.9–10.3)
Chloride: 104 mmol/L (ref 98–111)
Creatinine, Ser: 1.09 mg/dL — ABNORMAL HIGH (ref 0.44–1.00)
GFR, Estimated: 48 mL/min — ABNORMAL LOW (ref >60)
Glucose, Bld: 147 mg/dL — ABNORMAL HIGH (ref 70–99)
Potassium: 4.5 mmol/L (ref 3.5–5.1)
Sodium: 138 mmol/L (ref 135–145)
Total Bilirubin: 0.9 mg/dL (ref 0.3–1.2)
Total Protein: 5 g/dL — ABNORMAL LOW (ref 6.5–8.1)

## 2020-12-02 LAB — BRAIN NATRIURETIC PEPTIDE: B Natriuretic Peptide: 190 pg/mL — ABNORMAL HIGH (ref 0.0–100.0)

## 2020-12-02 LAB — PHOSPHORUS: Phosphorus: 2.5 mg/dL (ref 2.5–4.6)

## 2020-12-02 LAB — T3: T3, Total: 51 ng/dL — ABNORMAL LOW (ref 71–180)

## 2020-12-02 LAB — MAGNESIUM: Magnesium: 1.6 mg/dL — ABNORMAL LOW (ref 1.7–2.4)

## 2020-12-02 SURGERY — ESOPHAGOGASTRODUODENOSCOPY (EGD) WITH PROPOFOL
Anesthesia: General

## 2020-12-02 MED ORDER — LABETALOL HCL 5 MG/ML IV SOLN
INTRAVENOUS | Status: DC | PRN
  Administered 2020-12-02: 5 mg via INTRAVENOUS

## 2020-12-02 MED ORDER — IPRATROPIUM-ALBUTEROL 0.5-2.5 (3) MG/3ML IN SOLN
RESPIRATORY_TRACT | Status: AC
  Filled 2020-12-02: qty 3

## 2020-12-02 MED ORDER — SODIUM CHLORIDE 0.9 % IV SOLN: INTRAVENOUS | Status: DC | PRN

## 2020-12-02 MED ORDER — MAGNESIUM SULFATE 2 GM/50ML IV SOLN
2.0000 g | Freq: Once | INTRAVENOUS | Status: DC
  Filled 2020-12-02: qty 50

## 2020-12-02 MED ORDER — PROPOFOL 500 MG/50ML IV EMUL
INTRAVENOUS | Status: DC | PRN
  Administered 2020-12-02: 25 ug/kg/min via INTRAVENOUS

## 2020-12-02 MED ORDER — ALBUTEROL SULFATE (2.5 MG/3ML) 0.083% IN NEBU
2.5000 mg | INHALATION_SOLUTION | Freq: Once | RESPIRATORY_TRACT | Status: AC
  Administered 2020-12-02: 2.5 mg via RESPIRATORY_TRACT
  Filled 2020-12-02: qty 3

## 2020-12-02 MED ORDER — EPINEPHRINE 1 MG/10ML IJ SOSY
PREFILLED_SYRINGE | INTRAMUSCULAR | Status: DC | PRN
  Administered 2020-12-02: 0.5 mg via INTRATRACHEAL

## 2020-12-02 MED ORDER — LIDOCAINE HCL (CARDIAC) PF 100 MG/5ML IV SOSY
PREFILLED_SYRINGE | INTRAVENOUS | Status: DC | PRN
  Administered 2020-12-02: 50 mg via INTRAVENOUS

## 2020-12-02 MED ORDER — EPINEPHRINE 1 MG/10ML IJ SOSY
PREFILLED_SYRINGE | INTRAMUSCULAR | Status: AC
  Filled 2020-12-02: qty 10

## 2020-12-02 NOTE — Transfer of Care (Signed)
Immediate Anesthesia Transfer of Care Note  Patient: Masie Bermingham  Procedure(s) Performed: ESOPHAGOGASTRODUODENOSCOPY (EGD) WITH PROPOFOL  Patient Location: PACU  Anesthesia Type:General  Level of Consciousness: sedated  Airway & Oxygen Therapy: Patient Spontanous Breathing and Patient connected to nasal cannula oxygen  Post-op Assessment: Report given to RN and Post -op Vital signs reviewed and stable  Post vital signs: Reviewed and stable  Last Vitals:  Vitals Value Taken Time  BP 120/105 12/02/20 1359  Temp 35.8 C 12/02/20 1359  Pulse 102 12/02/20 1359  Resp 25 12/02/20 1359  SpO2      Last Pain:  Vitals:   12/02/20 1359  TempSrc: Temporal  PainSc:          Complications: No notable events documented.

## 2020-12-02 NOTE — ED Notes (Addendum)
Pt noted to have positive cullens sign & extensive bruising to L flank. MD [place] notified via secure chat. Awaiting response.

## 2020-12-02 NOTE — ED Notes (Signed)
Pt asleep in semi fowlers position in no apparent acute distress. Awakens while changing amio bag. Denies needs at this time. Call light in reach. Purewick draining well.

## 2020-12-02 NOTE — ED Notes (Signed)
Bedding changed. Pt cleaned and new brief and purewick in place. Pt calm and cooperative.  Tech and RN noticed bruise to right mid back and runs around to mid stomach area. Tech notified RN and doctor.

## 2020-12-02 NOTE — Progress Notes (Signed)
[facility] Cardiology    SUBJECTIVE: The patient denies chest pain, shortness of breath, or palpitations.   Vitals:   12/02/20 0700 12/02/20 0714 12/02/20 0744 12/02/20 0800  BP: 121/90   101/76  Pulse: (!) 118 (!) 125    Resp: 19 16 16    Temp:      TempSrc:      SpO2: 97% 99% 97% 100%     Intake/Output Summary (Last 24 hours) at 12/02/2020 12/04/2020 Last data filed at 12/02/2020 12/04/2020 Gross per 24 hour  Intake 1009.84 ml  Output --  Net 1009.84 ml      PHYSICAL EXAM  General: Well developed, well nourished, elderly female lying in bed in no acute distress HEENT:  Normocephalic and atramatic Neck:  No JVD.  Lungs: normal effort of breathing on room air. Heart: irr irr without gallops or murmurs.  Extremities: No clubbing, cyanosis with mild bil ankle edema.   Neuro: Alert and oriented X 3. Psych:  Good affect, responds appropriately   LABS: Basic Metabolic Panel: Recent Labs    12/01/20 0648 12/02/20 0755  NA 139 138  K 2.8* 4.5  CL 101 104  CO2 29 25  GLUCOSE 123* 147*  BUN 56* 49*  CREATININE 0.91 1.09*  CALCIUM 7.7* 8.4*  MG 1.6* 1.6*  PHOS 2.6 2.5   Liver Function Tests: Recent Labs    12/01/20 0648 12/02/20 0755  AST 18 21  ALT 35 34  ALKPHOS 46 52  BILITOT 1.2 0.9  PROT 4.2* 5.0*  ALBUMIN 2.6* 3.1*   Recent Labs    11/30/20 2108  LIPASE 40   CBC: Recent Labs    11/30/20 2108 12/01/20 0019 12/01/20 1241 12/01/20 1804  WBC 12.9*   < > 10.7* 12.8*  NEUTROABS 10.4*  --   --   --   HGB 11.2*   < > 8.7* 9.0*  HCT 33.6*   < > 27.4* 28.5*  MCV 99.7   < > 100.4* 104.0*  PLT 195   < > 157 152   < > = values in this interval not displayed.   Cardiac Enzymes: No results for input(s): CKTOTAL, CKMB, CKMBINDEX, TROPONINI in the last 72 hours. BNP: Invalid input(s): POCBNP D-Dimer: No results for input(s): DDIMER in the last 72 hours. Hemoglobin A1C: No results for input(s): HGBA1C in the last 72 hours. Fasting Lipid Panel: No results for  input(s): CHOL, HDL, LDLCALC, TRIG, CHOLHDL, LDLDIRECT in the last 72 hours. Thyroid Function Tests: Recent Labs    12/01/20 0648  TSH 1.329   Anemia Panel: No results for input(s): VITAMINB12, FOLATE, FERRITIN, TIBC, IRON, RETICCTPCT in the last 72 hours.  CT ANGIO GI BLEED  Result Date: 11/30/2020 CLINICAL DATA:  GI bleed. EXAM: CTA ABDOMEN AND PELVIS WITHOUT AND WITH CONTRAST TECHNIQUE: Multidetector CT imaging of the abdomen and pelvis was performed using the standard protocol during bolus administration of intravenous contrast. Multiplanar reconstructed images and MIPs were obtained and reviewed to evaluate the vascular anatomy. CONTRAST:  12/02/2020 OMNIPAQUE IOHEXOL 350 MG/ML SOLN COMPARISON:  None. FINDINGS: VASCULAR Aorta: Moderate atherosclerotic calcification. No aneurysmal dilatation or dissection. No periaortic fluid or hematoma. There is a focal area of penetrating ulcer involving the right lateral wall of the infrarenal abdominal aorta measuring approximately 7 mm in depth. Celiac: Atherosclerotic calcification of the origin of the celiac axis. The celiac axis and its major branches are patent. SMA: Patent without evidence of aneurysm, dissection, vasculitis or significant stenosis. Renals: Atherosclerotic calcification of the origin  of the right renal artery. The right renal artery remains patent. IMA: Patent without evidence of aneurysm, dissection, vasculitis or significant stenosis. Inflow: Atherosclerotic calcification of the iliac arteries. No aneurysmal dilatation or dissection. Proximal Outflow: Bilateral common femoral and visualized portions of the superficial and profunda femoral arteries are patent without evidence of aneurysm, dissection, vasculitis or significant stenosis. Veins: No obvious venous abnormality within the limitations of this arterial phase study. Review of the MIP images confirms the above findings. NON-VASCULAR Lower chest: Partially visualized moderate right  pleural effusion. No intra-abdominal free air or free fluid. Hepatobiliary: The liver is unremarkable. No intrahepatic biliary dilatation. Multiple stones. No pericholecystic fluid or evidence of acute cholecystitis by CT. Pancreas: Indeterminate faint 11 mm hypodense lesion in the distal pancreas which is not characterized on this CT, possibly a side branch IPMN. No acute inflammatory changes. No dilatation of the main pancreatic duct or gland atrophy. Spleen: Normal in size without focal abnormality. Adrenals/Urinary Tract: The right adrenal gland is unremarkable. 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 No hydronephrosis. Several small right renal cysts. The urinary bladder is grossly unremarkable. Stomach/Bowel: Diffuse diverticulosis of the descending colon and severe sigmoid diverticulosis without active inflammatory changes. No bowel obstruction or active inflammation. There is a large hiatal hernia. No extravasation of contrast to suggest active GI bleed. Appendectomy. Lymphatic: No adenopathy. Reproductive: Hysterectomy. Other: Faint 2.3 x 1.3 cm focal area of high attenuating prominence in the right rectus muscle which may represent focal area of muscular prominence versus a small hematoma (77/5). Musculoskeletal: Osteopenia with degenerative changes of the spine and scoliosis. No acute osseous pathology. IMPRESSION: 1. No evidence of active GI bleed. 2. Severe colonic diverticulosis without active inflammatory changes. 3. Large hiatal hernia. 4. Cholelithiasis. 5. Solitary right kidney. 6. Partially visualized moderate right pleural effusion. Electronically Signed   By: Elgie Collard M.D.   On: 11/30/2020 23:45       TELEMETRY: atrial fibrillation, 100-120s bpm  ASSESSMENT AND PLAN:  Principal Problem:   GI bleed Active Problems:   Atrial fibrillation with RVR (HCC)   Essential hypertension   Hyperlipidemia   CAD (coronary artery disease)   Edema   Diabetes  (HCC)   Hypokalemia    GI bleed with recent initiation of Eliquis, hemoglobin and hematocrit 11.2 and 33.6, and have since dropped to 8.7 and 27.4. GI recommends EGD. Atrial fibrillation with RVR, with known history of paroxysmal atrial fibrillation. Discontinued diltiazem drip due to hypotension, currently on amiodarone drip with heart rates in the 100s-120s, asymptomatic, compensatory for GI bleed. Hypotension, asymptomatic, in setting of GI bleed, improved  Plan: Continue to hold Eliquis and aspirin Continue amiodarone drip Proceed with EGD today    Leanora Ivanoff, PA-C 12/02/2020 8:29 AM

## 2020-12-02 NOTE — Anesthesia Preprocedure Evaluation (Addendum)
Anesthesia Evaluation  Patient identified by MRN, date of birth, ID band Patient awake    Reviewed: Allergy & Precautions, NPO status , Patient's Chart, lab work & pertinent test results  Airway Mallampati: II       Dental  (+) Chipped, Poor Dentition   Pulmonary shortness of breath and with exertion, COPD,  COPD inhaler, former smoker,    Pulmonary exam normal        Cardiovascular Exercise Tolerance: Poor hypertension, Pt. on home beta blockers and Pt. on medications + CAD and + DOE  Normal cardiovascular exam+ dysrhythmias Atrial Fibrillation   2D echocardiogram on 11/04/2020 revealed normal left ventricular function with LVEF 60-65% with moderate mitral and tricuspid regurgitation.   Neuro/Psych negative neurological ROS  negative psych ROS   GI/Hepatic Neg liver ROS, Melena   Endo/Other  diabetes  Renal/GU Renal disease (AKI)  negative genitourinary   Musculoskeletal negative musculoskeletal ROS (+)   Abdominal Normal abdominal exam  (+)   Peds negative pediatric ROS (+)  Hematology  (+) Blood dyscrasia (Acute Blood Loss Anemia), anemia , Thrombocytopenia   Anesthesia Other Findings Hypokalemia- Treated  GI bleed with melena/ BRBPR per nursing staff. Pt with recent initiation of Eliquis, hemoglobin and hematocrit 11.2 and 33.6 on admissiuon, and have since dropped to 8.7 and 27.4. GI will perform EGD.   Atrial fibrillation with RVR- Known history of paroxysmal atrial fibrillation. Cardiology was consulted. Discontinued diltiazem drip due to hypotension, and is currently on amiodarone drip with heart rates in the 100s-120s. Pt is asymptomatic from hypotension.   Pt with abdominal wall hematoma seen on CT scan and ecchymosis noted to abdominal wall.     Reproductive/Obstetrics negative OB ROS                           Anesthesia Physical Anesthesia Plan  ASA: 4  Anesthesia Plan:  General   Post-op Pain Management:    Induction: Intravenous  PONV Risk Score and Plan: TIVA and Treatment may vary due to age or medical condition  Airway Management Planned: Nasal Cannula and Natural Airway  Additional Equipment:   Intra-op Plan:   Post-operative Plan:   Informed Consent: I have reviewed the patients History and Physical, chart, labs and discussed the procedure including the risks, benefits and alternatives for the proposed anesthesia with the patient or authorized representative who has indicated his/her understanding and acceptance.     Dental advisory given  Plan Discussed with: Anesthesiologist and CRNA  Anesthesia Plan Comments:        Anesthesia Quick Evaluation

## 2020-12-02 NOTE — Op Note (Signed)
[facility] Gastroenterology Patient Name: Jane Campbell Procedure Date: 12/02/2020 1:20 PM MRN: [id number] Account #: [id number] Date of Birth: February 18, 1926 Admit Type: Outpatient Age: 87 Room: [facility] ENDO ROOM 3 Gender: Female Note Status: Finalized Instrument Name: Laurette Schimke [id number] Procedure:             Upper GI endoscopy Indications:           Melena Providers:             Wyline Mood MD, MD Medicines:             Monitored Anesthesia Care Complications:         No immediate complications. Procedure:             Pre-Anesthesia Assessment:                        - Prior to the procedure, a History and Physical was                         performed, and patient medications, allergies and                         sensitivities were reviewed. The patient's tolerance                         of previous anesthesia was reviewed.                        - The risks and benefits of the procedure and the                         sedation options and risks were discussed with the                         patient. All questions were answered and informed                         consent was obtained.                        - ASA Grade Assessment: III - A patient with severe                         systemic disease.                        After obtaining informed consent, the endoscope was                         passed under direct vision. Throughout the procedure,                         the patient's blood pressure, pulse, and oxygen                         saturations were monitored continuously. The Endoscope                         was introduced through the mouth, and advanced to the  third part of duodenum. The upper GI endoscopy was                         accomplished without difficulty. The patient tolerated                         the procedure well. Findings:      The lower third of the esophagus was significantly tortuous.      A large  hiatal hernia was present.      The esophagus was normal.      One non-bleeding cratered duodenal ulcer with a nonbleeding visible       vessel (Forrest Class IIa) was found in the duodenal bulb. The lesion       was 20 mm in largest dimension. Area was successfully injected with 4 mL       of a 1:10,000 solution of epinephrine for drug delivery. Gold probe for       bleeding prevention using bipolar probe was successful. Impression:            - Tortuous esophagus.                        - Large hiatal hernia.                        - Normal esophagus.                        - Non-bleeding duodenal ulcer with a nonbleeding                         visible vessel (Forrest Class IIa). Injected. Treated                         with bipolar cautery.                        - No specimens collected. Recommendation:        - NPO today.                        - Continue present medications.                        - IV PPI GTT for 72 hours                        Check H pylori serology                        Monitor closely for bleeding and if suspectede may                         need repeat EGD or emobolization with vascular surgery                        Serial abdominal exam , very deep ulcer , possibility                         for perforation can occur Procedure Code(s):     --- Professional ---  43255, Esophagogastroduodenoscopy, flexible,                         transoral; with control of bleeding, any method                        43236, 59, Esophagogastroduodenoscopy, flexible,                         transoral; with directed submucosal injection(s), any                         substance Diagnosis Code(s):     --- Professional ---                        Q39.9, Congenital malformation of esophagus,                         unspecified                        K44.9, Diaphragmatic hernia without obstruction or                         gangrene                         K26.4, Chronic or unspecified duodenal ulcer with                         hemorrhage                        K92.1, Melena (includes Hematochezia) CPT copyright 2019 American Medical Association. All rights reserved. The codes documented in this report are preliminary and upon coder review may  be revised to meet current compliance requirements. Wyline Mood, MD Wyline Mood MD, MD 12/02/2020 1:58:45 PM This report has been signed electronically. Number of Addenda: 0 Note Initiated On: 12/02/2020 1:20 PM Estimated Blood Loss:  Estimated blood loss: none.      [facility]

## 2020-12-02 NOTE — Progress Notes (Signed)
PROGRESS NOTE    Jane Campbell  JQB:[id number] DOB: 16-Jun-1926 DOA: 11/30/2020 PCP: Oneita Hurt, No    Brief Narrative:  Jane Campbell is a 87 year old female with past medical history significant for paroxysmal atrial fibrillation, CAD, type 2 diabetes mellitus, essential hypertension, chronic lower extremity edema, hypothyroidism, recently started on anticoagulation with Eliquis 11/09/2020 who presented to [facility] ED on 10/24 with complaints of crampy abdominal pain, rectal bleeding with melena/bright red blood.  Also reports weakness at home generalized fatigue and had to lower herself to the ground, no syncopal episode.  In the ED, afebrile, BP 120/90, HR 150, RR 22, SPO2 98% on room air.  WBC 12.9, hemoglobin 11.2 Hgb 12.3 one month ago), platelets 195, sodium 137, potassium 3.3, chloride 98, CO2 31, BUN 56, creatinine 1.05.  INR 1.5, glucose 168.  FOBT positive.  Gastroenterology consulted.  TRH consulted for further evaluation and management of GI bleed.  Assessment & Plan:   Principal Problem:   GI bleed Active Problems:   Atrial fibrillation with RVR (HCC)   Essential hypertension   Hyperlipidemia   CAD (coronary artery disease)   Edema   Diabetes (HCC)   Hypokalemia   Acute upper GI bleed 2/2 duodenal ulcer Patient presenting to ED with generalized weakness, fatigue, crampy abdominal pain and rectal bleeding.  Recently started on anticoagulation with Eliquis on 11/09/2020.  FOBT positive.  Hemoglobin 11.2 on admission.  Underwent EGD on 12/02/2020 with findings of nonbleeding duodenal ulcer with nonbleeding visible vessel treated with epinephrine injection and bipolar cautery. --Gastroenterology following, appreciate assistance --Hgb 11.2>11.5 --Eliquis discontinued --Continue Protonix drip for 72 hours --Continue n.p.o. --Repeat CBC in the a.m.  Paroxysmal atrial fibrillation with RVR Initially started on a diltiazem drip which was discontinued due to hypotension. --Cardiology  following, appreciate assistance --Anticoagulation discontinued due to GI bleed as above --Continue amiodarone drip --Continue monitor on telemetry  Hypokalemia Repleted, potassium 4.5 this morning. --Repeat electrolytes in a.m.  Left lower extremity cellulitis --Ceftriaxone 2 g IV every 24 hours  Hypomagnesemia Magnesium 1.6 this morning, will replete. --Repeat magnesium level in a.m.  Essential hypertension On metoprolol succinate 50 mg p.o. twice daily at home. --BP 101/64 this morning, borderline hypotensive --Continue to hold home metoprolol while on amiodarone drip as above --Continue monitor BP closely in the setting of GI bleed  Hyperlipidemia: Atorvastatin 10 mg p.o. daily  Type 2 diabetes mellitus Diet controlled at home. --SSI for coverage --CBGs q4h while NPO  Hypothyroidism TSH 1.329, free T41.73, T3 51. --Levothyroxine 125 mcg p.o. daily  Anxiety: Alprazolam 0.25 mg p.o. 3 times daily as needed anxiety   DVT prophylaxis: SCDs Start: 12/01/20 0402   Code Status: DNR Family Communication: No family present at bedside this morning.  Disposition Plan:  Level of care: Progressive Cardiac Status is: Inpatient  Remains inpatient appropriate because: Continues on amiodarone and Protonix drip    Consultants:  Cardiology Gastroenterology  Procedures:  EGD 10/26  Antimicrobials:  Ceftriaxone 10/25>>   Subjective: Patient seen examined at bedside, resting comfortably.  Continues on amiodarone and Protonix drip.  Patient wishes she could have something to eat and not wait so long for the EGD.  No other specific complaints or concerns at this time.  Denies headache, no dizziness, no chest pain, no palpitations, no shortness of breath, no abdominal pain.  No acute events overnight per nursing staff.  Objective: Vitals:   12/02/20 1359 12/02/20 1419 12/02/20 1429 12/02/20 1513  BP: (!) 120/105 96/73 (!) 141/95 124/84  Pulse: Marland Kitchen)  102   (!) 101  Resp:  (!) 25 (!) 21 (!) 30 17  Temp: (!) 96.4 F (35.8 C)   97.9 F (36.6 C)  TempSrc: Temporal   Oral  SpO2:   100% 99%  Weight:      Height:        Intake/Output Summary (Last 24 hours) at 12/02/2020 1819 Last data filed at 12/02/2020 1804 Gross per 24 hour  Intake 1186.81 ml  Output 350 ml  Net 836.81 ml   Filed Weights   12/02/20 1247  Weight: 72.6 kg    Examination:  General exam: Appears calm and comfortable, elderly in appearance Respiratory system: Clear to auscultation. Respiratory effort normal.  On room air with SPO2 100% at rest Cardiovascular system: S1 & S2 heard, RRR. No JVD, murmurs, rubs, gallops or clicks. No pedal edema. Gastrointestinal system: Abdomen is nondistended, soft and nontender. No organomegaly or masses felt. Normal bowel sounds heard. Central nervous system: Alert and oriented. No focal neurological deficits. Extremities: Symmetric 5 x 5 power. Skin: No rashes, lesions or ulcers Psychiatry: Judgement and insight appear normal. Mood & affect appropriate.     Data Reviewed: I have personally reviewed following labs and imaging studies  CBC: Recent Labs  Lab 11/30/20 2108 12/01/20 0019 12/01/20 0648 12/01/20 1241 12/01/20 1804 12/02/20 0755  WBC 12.9* 12.2* 10.1 10.7* 12.8* 11.5*  NEUTROABS 10.4*  --   --   --   --  9.4*  HGB 11.2* 9.4* 9.0* 8.7* 9.0* 9.4*  HCT 33.6* 28.8* 28.0* 27.4* 28.5* 29.6*  MCV 99.7 100.7* 100.7* 100.4* 104.0* 101.4*  PLT 195 166 159 157 152 147*   Basic Metabolic Panel: Recent Labs  Lab 11/30/20 2108 12/01/20 0648 12/02/20 0755  NA 137 139 138  K 3.3* 2.8* 4.5  CL 98 101 104  CO2 31 29 25   GLUCOSE 168* 123* 147*  BUN 56* 56* 49*  CREATININE 1.05* 0.91 1.09*  CALCIUM 8.5* 7.7* 8.4*  MG  --  1.6* 1.6*  PHOS  --  2.6 2.5   GFR: Estimated Creatinine Clearance: 32.1 mL/min (A) (by C-G formula based on SCr of 1.09 mg/dL (H)). Liver Function Tests: Recent Labs  Lab 11/30/20 2108 12/01/20 0648  12/02/20 0755  AST 21 18 21   ALT 48* 35 34  ALKPHOS 59 46 52  BILITOT 1.4* 1.2 0.9  PROT 5.2* 4.2* 5.0*  ALBUMIN 3.1* 2.6* 3.1*   Recent Labs  Lab 11/30/20 2108  LIPASE 40   No results for input(s): AMMONIA in the last 168 hours. Coagulation Profile: Recent Labs  Lab 11/30/20 2108  INR 1.5*   Cardiac Enzymes: No results for input(s): CKTOTAL, CKMB, CKMBINDEX, TROPONINI in the last 168 hours. BNP (last 3 results) No results for input(s): PROBNP in the last 8760 hours. HbA1C: No results for input(s): HGBA1C in the last 72 hours. CBG: No results for input(s): GLUCAP in the last 168 hours. Lipid Profile: No results for input(s): CHOL, HDL, LDLCALC, TRIG, CHOLHDL, LDLDIRECT in the last 72 hours. Thyroid Function Tests: Recent Labs    12/01/20 0648  TSH 1.329  FREET4 1.73*   Anemia Panel: No results for input(s): VITAMINB12, FOLATE, FERRITIN, TIBC, IRON, RETICCTPCT in the last 72 hours. Sepsis Labs: No results for input(s): PROCALCITON, LATICACIDVEN in the last 168 hours.  Recent Results (from the past 240 hour(s))  Resp Panel by RT-PCR (Flu A&B, Covid) Nasopharyngeal Swab     Status: None   Collection Time: 11/30/20 10:53 PM  Specimen: Nasopharyngeal Swab; Nasopharyngeal(NP) swabs in vial transport medium  Result Value Ref Range Status   SARS Coronavirus 2 by RT PCR NEGATIVE NEGATIVE Final    Comment: (NOTE) SARS-CoV-2 target nucleic acids are NOT DETECTED.  The SARS-CoV-2 RNA is generally detectable in upper respiratory specimens during the acute phase of infection. The lowest concentration of SARS-CoV-2 viral copies this assay can detect is 138 copies/mL. A negative result does not preclude SARS-Cov-2 infection and should not be used as the sole basis for treatment or other patient management decisions. A negative result may occur with  improper specimen collection/handling, submission of specimen other than nasopharyngeal swab, presence of viral mutation(s)  within the areas targeted by this assay, and inadequate number of viral copies(<138 copies/mL). A negative result must be combined with clinical observations, patient history, and epidemiological information. The expected result is Negative.  Fact Sheet for Patients:  BloggerCourse.com  Fact Sheet for Healthcare Providers:  SeriousBroker.it  This test is no t yet approved or cleared by the [place] FDA and  has been authorized for detection and/or diagnosis of SARS-CoV-2 by FDA under an Emergency Use Authorization (EUA). This EUA will remain  in effect (meaning this test can be used) for the duration of the COVID-19 declaration under Section 564(b)(1) of the Act, 21 U.S.C.section 360bbb-3(b)(1), unless the authorization is terminated  or revoked sooner.       Influenza A by PCR NEGATIVE NEGATIVE Final   Influenza B by PCR NEGATIVE NEGATIVE Final    Comment: (NOTE) The Xpert Xpress SARS-CoV-2/FLU/RSV plus assay is intended as an aid in the diagnosis of influenza from Nasopharyngeal swab specimens and should not be used as a sole basis for treatment. Nasal washings and aspirates are unacceptable for Xpert Xpress SARS-CoV-2/FLU/RSV testing.  Fact Sheet for Patients: BloggerCourse.com  Fact Sheet for Healthcare Providers: SeriousBroker.it  This test is not yet approved or cleared by the [place] FDA and has been authorized for detection and/or diagnosis of SARS-CoV-2 by FDA under an Emergency Use Authorization (EUA). This EUA will remain in effect (meaning this test can be used) for the duration of the COVID-19 declaration under Section 564(b)(1) of the Act, 21 U.S.C. section 360bbb-3(b)(1), unless the authorization is terminated or revoked.  Performed at [facility], [address] [place], [place] [address]          Radiology Studies: CT ANGIO GI  BLEED  Result Date: 11/30/2020 CLINICAL DATA:  GI bleed. EXAM: CTA ABDOMEN AND PELVIS WITHOUT AND WITH CONTRAST TECHNIQUE: Multidetector CT imaging of the abdomen and pelvis was performed using the standard protocol during bolus administration of intravenous contrast. Multiplanar reconstructed images and MIPs were obtained and reviewed to evaluate the vascular anatomy. CONTRAST:  OMNIPAQUE IOHEXOL 350 MG/ML SOLN COMPARISON:  None. FINDINGS: VASCULAR Aorta: Moderate atherosclerotic calcification. No aneurysmal dilatation or dissection. No periaortic fluid or hematoma. There is a focal area of penetrating ulcer involving the right lateral wall of the infrarenal abdominal aorta measuring approximately 7 mm in depth. Celiac: Atherosclerotic calcification of the origin of the celiac axis. The celiac axis and its major branches are patent. SMA: Patent without evidence of aneurysm, dissection, vasculitis or significant stenosis. Renals: Atherosclerotic calcification of the origin of the right renal artery. The right renal artery remains patent. IMA: Patent without evidence of aneurysm, dissection, vasculitis or significant stenosis. Inflow: Atherosclerotic calcification of the iliac arteries. No aneurysmal dilatation or dissection. Proximal Outflow: Bilateral common femoral and visualized portions of the superficial and  profunda femoral arteries are patent without evidence of aneurysm, dissection, vasculitis or significant stenosis. Veins: No obvious venous abnormality within the limitations of this arterial phase study. Review of the MIP images confirms the above findings. NON-VASCULAR Lower chest: Partially visualized moderate right pleural effusion. No intra-abdominal free air or free fluid. Hepatobiliary: The liver is unremarkable. No intrahepatic biliary dilatation. Multiple stones. No pericholecystic fluid or evidence of acute cholecystitis by CT. Pancreas: Indeterminate faint 11 mm hypodense lesion in the  distal pancreas which is not characterized on this CT, possibly a side branch IPMN. No acute inflammatory changes. No dilatation of the main pancreatic duct or gland atrophy. Spleen: Normal in size without focal abnormality. Adrenals/Urinary Tract: The right adrenal gland is unremarkable. 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 No hydronephrosis. Several small right renal cysts. The urinary bladder is grossly unremarkable. Stomach/Bowel: Diffuse diverticulosis of the descending colon and severe sigmoid diverticulosis without active inflammatory changes. No bowel obstruction or active inflammation. There is a large hiatal hernia. No extravasation of contrast to suggest active GI bleed. Appendectomy. Lymphatic: No adenopathy. Reproductive: Hysterectomy. Other: Faint 2.3 x 1.3 cm focal area of high attenuating prominence in the right rectus muscle which may represent focal area of muscular prominence versus a small hematoma (77/5). Musculoskeletal: Osteopenia with degenerative changes of the spine and scoliosis. No acute osseous pathology. IMPRESSION: 1. No evidence of active GI bleed. 2. Severe colonic diverticulosis without active inflammatory changes. 3. Large hiatal hernia. 4. Cholelithiasis. 5. Solitary right kidney. 6. Partially visualized moderate right pleural effusion. Electronically Signed   By: Elgie Collard M.D.   On: 11/30/2020 23:45        Scheduled Meds:  atorvastatin  10 mg Oral QHS   EPINEPHrine       levothyroxine  125 mcg Oral QAC breakfast   multivitamin with minerals  1 tablet Oral Daily   [START ON 12/04/2020] pantoprazole  40 mg Intravenous Q12H   Continuous Infusions:  amiodarone 30 mg/hr (12/02/20 1317)   cefTRIAXone (ROCEPHIN)  IV Stopped (12/01/20 1833)   magnesium sulfate bolus IVPB     pantoprazole 8 mg/hr (12/02/20 1715)     LOS: 2 days    Time spent: 39 minutes spent on chart review, discussion with nursing staff, consultants,  updating family and interview/physical exam; more than 50% of that time was spent in counseling and/or coordination of care.    Alvira Philips [place], DO [facility] Available via Epic secure chat 7am-7pm After these hours, please refer to coverage provider listed on [email or web address] 12/02/2020, 6:19 PM

## 2020-12-02 NOTE — Progress Notes (Signed)
Patient remains NPO since midnight 12/02/20. No new concerns at this time.

## 2020-12-02 NOTE — H&P (Signed)
Wyline Mood, MD [address], [place], [place], [address] [address], [address], [place], [place], [address] Phone: [phone]  Fax: [phone]  Primary Care Physician:  Pcp, No   Pre-Procedure History & Physical: HPI:  Jane Campbell is a 87 y.o. female is here for an endoscopy    Past Medical History:  Diagnosis Date   A-fib [facility])    Coronary artery disease    Diabetes mellitus without complication (HCC)    Hypertension     Past Surgical History:  Procedure Laterality Date   ABDOMINAL HYSTERECTOMY     APPENDECTOMY     Left kidney Removed      Prior to Admission medications   Medication Sig Start Date End Date Taking? Authorizing Provider  acetaminophen (TYLENOL) 325 MG tablet Take 650 mg by mouth 3 (three) times daily as needed for mild pain.   Yes [provider]  albuterol (VENTOLIN HFA) 108 (90 Base) MCG/ACT inhaler Inhale 2 puffs into the lungs every 6 (six) hours as needed for wheezing or shortness of breath.   Yes [provider]  ALPRAZolam (XANAX) 0.25 MG tablet Take 0.25 mg by mouth 3 (three) times daily as needed for anxiety.   Yes [provider]  apixaban (ELIQUIS) 2.5 MG TABS tablet Take 2.5 mg by mouth 2 (two) times daily.   Yes [provider]  atorvastatin (LIPITOR) 10 MG tablet Take 10 mg by mouth at bedtime.   Yes [provider]  Cranberry 500 MG CAPS Take 500 mg by mouth daily.   Yes [provider]  fluticasone (FLONASE) 50 MCG/ACT nasal spray Place 2 sprays into both nostrils daily as needed for allergies or rhinitis.   Yes [provider]  furosemide (LASIX) 40 MG tablet Take 40 mg by mouth daily. 11/25/20 12/02/20 Yes [provider]  levothyroxine (SYNTHROID) 125 MCG tablet Take 1 tablet (125 mcg total) by mouth daily before breakfast. 11/06/20 12/06/20 Yes Delfino Lovett, MD  loperamide (IMODIUM) 2 MG capsule Take 2 mg by mouth 4 (four) times daily as needed for  diarrhea or loose stools.   Yes [provider]  metoprolol succinate (TOPROL-XL) 50 MG 24 hr tablet Take 50 mg by mouth 2 (two) times daily.   Yes [provider]  Multiple Vitamins-Minerals (MULTIVITAMIN WITH MINERALS) tablet Take 1 tablet by mouth daily.   Yes [provider]  ondansetron (ZOFRAN) 4 MG tablet Take 4 mg by mouth 2 (two) times daily as needed for nausea or vomiting.   Yes [provider]  senna (SENOKOT) 8.6 MG tablet Take 2 tablets by mouth at bedtime.   Yes [provider]  diltiazem (CARDIZEM CD) 120 MG 24 hr capsule Take 1 capsule (120 mg total) by mouth daily. Patient not taking: No sig reported 11/06/20 12/06/20  Delfino Lovett, MD    Allergies as of 11/30/2020 - Review Complete 11/30/2020  Allergen Reaction Noted   Penicillins  11/03/2020    History reviewed. No pertinent family history.  Social History   Socioeconomic History   Marital status: Widowed    Spouse name: Not on file   Number of children: Not on file   Years of education: Not on file   Highest education level: Not on file  Occupational History   Not on file  Tobacco Use   Smoking status: Former    Types: Cigarettes    Quit date: 02/07/1962    Years since quitting: 58.8   Smokeless  tobacco: Former  Substance and Sexual Activity   Alcohol use: Not Currently   Drug use: Never   Sexual activity: Not Currently  Other Topics Concern   Not on file  Social History Narrative   Not on file   Social Determinants of Health   Financial Resource Strain: Not on file  Food Insecurity: Not on file  Transportation Needs: Not on file  Physical Activity: Not on file  Stress: Not on file  Social Connections: Not on file  Intimate Partner Violence: Not on file    Review of Systems: See HPI, otherwise negative ROS  Physical Exam: BP (!) 119/98   Pulse (!) 136   Temp (!) 97.3 F (36.3 C) (Temporal)   Resp 18   Ht 5\' 3"  (1.6 m)   Wt 72.6 kg   LMP  (LMP  Unknown)   SpO2 100%   BMI 28.34 kg/m  General:   Alert,  pleasant and cooperative in NAD Head:  Normocephalic and atraumatic. Neck:  Supple; no masses or thyromegaly. Lungs:  Clear throughout to auscultation, normal respiratory effort.    Heart:  +S1, +S2, Regular rate and rhythm, No edema. Abdomen:  Soft, nontender and nondistended. Normal bowel sounds, without guarding, and without rebound.   Neurologic:  Alert and  oriented x4;  grossly normal neurologically.  Impression/Plan: Jane Campbell is here for an endoscopy  to be performed for  evaluation of GI bleed    Risks, benefits, limitations, and alternatives regarding endoscopy have been reviewed with the patient.  Questions have been answered.  All parties agreeable.   Sherren Kerns, MD  12/02/2020, 1:21 PM

## 2020-12-03 ENCOUNTER — Encounter: Payer: Self-pay | Admitting: Gastroenterology

## 2020-12-03 DIAGNOSIS — I4891 Unspecified atrial fibrillation: Secondary | ICD-10-CM | POA: Diagnosis not present

## 2020-12-03 DIAGNOSIS — E119 Type 2 diabetes mellitus without complications: Secondary | ICD-10-CM | POA: Diagnosis not present

## 2020-12-03 DIAGNOSIS — I2510 Atherosclerotic heart disease of native coronary artery without angina pectoris: Secondary | ICD-10-CM | POA: Diagnosis not present

## 2020-12-03 DIAGNOSIS — K922 Gastrointestinal hemorrhage, unspecified: Secondary | ICD-10-CM | POA: Diagnosis not present

## 2020-12-03 LAB — CBC
HCT: 27.3 % — ABNORMAL LOW (ref 36.0–46.0)
Hemoglobin: 8.4 g/dL — ABNORMAL LOW (ref 12.0–15.0)
MCH: 32.2 pg (ref 26.0–34.0)
MCHC: 30.8 g/dL (ref 30.0–36.0)
MCV: 104.6 fL — ABNORMAL HIGH (ref 80.0–100.0)
Platelets: 133 10*3/uL — ABNORMAL LOW (ref 150–400)
RBC: 2.61 MIL/uL — ABNORMAL LOW (ref 3.87–5.11)
RDW: 16 % — ABNORMAL HIGH (ref 11.5–15.5)
WBC: 10.9 10*3/uL — ABNORMAL HIGH (ref 4.0–10.5)
nRBC: 0.2 % (ref 0.0–0.2)

## 2020-12-03 LAB — BASIC METABOLIC PANEL
Anion gap: 4 — ABNORMAL LOW (ref 5–15)
BUN: 42 mg/dL — ABNORMAL HIGH (ref 8–23)
CO2: 25 mmol/L (ref 22–32)
Calcium: 8.6 mg/dL — ABNORMAL LOW (ref 8.9–10.3)
Chloride: 110 mmol/L (ref 98–111)
Creatinine, Ser: 1.04 mg/dL — ABNORMAL HIGH (ref 0.44–1.00)
GFR, Estimated: 51 mL/min — ABNORMAL LOW (ref >60)
Glucose, Bld: 130 mg/dL — ABNORMAL HIGH (ref 70–99)
Potassium: 4.1 mmol/L (ref 3.5–5.1)
Sodium: 139 mmol/L (ref 135–145)

## 2020-12-03 LAB — HEMOGLOBIN AND HEMATOCRIT, BLOOD
HCT: 29.6 % — ABNORMAL LOW (ref 36.0–46.0)
Hemoglobin: 9.2 g/dL — ABNORMAL LOW (ref 12.0–15.0)

## 2020-12-03 LAB — HEMOGLOBIN A1C
Hgb A1c MFr Bld: 5.7 % — ABNORMAL HIGH (ref 4.8–5.6)
Mean Plasma Glucose: 116.89 mg/dL

## 2020-12-03 LAB — MAGNESIUM: Magnesium: 1.7 mg/dL (ref 1.7–2.4)

## 2020-12-03 MED ORDER — METOPROLOL TARTRATE 25 MG PO TABS
12.5000 mg | ORAL_TABLET | Freq: Two times a day (BID) | ORAL | Status: DC
  Administered 2020-12-03 – 2020-12-06 (×7): 12.5 mg via ORAL
  Filled 2020-12-03 (×7): qty 1

## 2020-12-03 MED ORDER — SUCRALFATE 1 GM/10ML PO SUSP
1.0000 g | Freq: Three times a day (TID) | ORAL | Status: DC
  Administered 2020-12-03 – 2020-12-21 (×71): 1 g via ORAL
  Filled 2020-12-03 (×70): qty 10

## 2020-12-03 MED ORDER — MAGNESIUM SULFATE 2 GM/50ML IV SOLN
2.0000 g | Freq: Once | INTRAVENOUS | Status: AC
  Administered 2020-12-03: 2 g via INTRAVENOUS
  Filled 2020-12-03: qty 50

## 2020-12-03 MED ORDER — AMIODARONE HCL 200 MG PO TABS
200.0000 mg | ORAL_TABLET | Freq: Two times a day (BID) | ORAL | Status: DC
  Administered 2020-12-03 – 2020-12-21 (×37): 200 mg via ORAL
  Filled 2020-12-03 (×37): qty 1

## 2020-12-03 NOTE — Progress Notes (Signed)
[facility]    SUBJECTIVE: the patient denies chest pain, shortness of breath or palpitations.    Vitals:   12/03/20 0016 12/03/20 0457 12/03/20 0600 12/03/20 0745  BP: 131/73 (!) 156/76 (!) 123/103 115/80  Pulse: (!) 103 (!) 111 77 (!) 103  Resp: 18 18 17 18   Temp: 97.8 F (36.6 C) 97.6 F (36.4 C)  98 F (36.7 C)  TempSrc: Oral     SpO2: 100% 100%  100%  Weight:      Height:         Intake/Output Summary (Last 24 hours) at 12/03/2020 12/05/2020 Last data filed at 12/02/2020 1804 Gross per 24 hour  Intake 300 ml  Output 350 ml  Net -50 ml      PHYSICAL EXAM  General: Well developed, well nourished, elderly female lying in bed in no acute distress HEENT:  Normocephalic and atramatic Neck:   No JVD.  Lungs: normal effort of breathing on room air. Heart: irr irr without gallops or murmurs.  Extremities: No clubbing, cyanosis with mild bil ankle edema.   Neuro: Alert and oriented  Psych:  Good affect, confused   LABS: Basic Metabolic Panel: Recent Labs    12/01/20 0648 12/02/20 0755 12/03/20 0555  NA 139 138 139  K 2.8* 4.5 4.1  CL 101 104 110  CO2 29 25 25   GLUCOSE 123* 147* 130*  BUN 56* 49* 42*  CREATININE 0.91 1.09* 1.04*  CALCIUM 7.7* 8.4* 8.6*  MG 1.6* 1.6* 1.7  PHOS 2.6 2.5  --    Liver Function Tests: Recent Labs    12/01/20 0648 12/02/20 0755  AST 18 21  ALT 35 34  ALKPHOS 46 52  BILITOT 1.2 0.9  PROT 4.2* 5.0*  ALBUMIN 2.6* 3.1*   Recent Labs    11/30/20 2108  LIPASE 40   CBC: Recent Labs    11/30/20 2108 12/01/20 0019 12/02/20 0755 12/03/20 0555  WBC 12.9*   < > 11.5* 10.9*  NEUTROABS 10.4*  --  9.4*  --   HGB 11.2*   < > 9.4* 8.4*  HCT 33.6*   < > 29.6* 27.3*  MCV 99.7   < > 101.4* 104.6*  PLT 195   < > 147* 133*   < > = values in this interval not displayed.   Cardiac Enzymes: No results for input(s): CKTOTAL, CKMB, CKMBINDEX, TROPONINI in the last 72 hours. BNP: Invalid input(s): POCBNP D-Dimer: No results for  input(s): DDIMER in the last 72 hours. Hemoglobin A1C: No results for input(s): HGBA1C in the last 72 hours. Fasting Lipid Panel: No results for input(s): CHOL, HDL, LDLCALC, TRIG, CHOLHDL, LDLDIRECT in the last 72 hours. Thyroid Function Tests: Recent Labs    12/01/20 0648  TSH 1.329   Anemia Panel: No results for input(s): VITAMINB12, FOLATE, FERRITIN, TIBC, IRON, RETICCTPCT in the last 72 hours.  No results found.  TELEMETRY: atrial fibrillation, 90s-110 bpm  ASSESSMENT AND PLAN:  Principal Problem:   GI bleed Active Problems:   Atrial fibrillation with RVR (HCC)   Essential hypertension   Hyperlipidemia   CAD (coronary artery disease)   Edema   Diabetes (HCC)   Hypokalemia    GI bleed with recent initiation of Eliquis, hemoglobin and hematocrit 11.2 and 33.6, and have since dropped to 8.7 and 27.4. Status post EGD, which revealed a non-bleeding duodenal ulcer with a nonbleeding visible vessel. Injected. Treated with bipolar cautery. Atrial fibrillation with RVR, with known history of paroxysmal atrial fibrillation. Discontinued  diltiazem drip due to hypotension, currently on amiodarone drip with heart rates in the 90s to 110s bpm, asymptomatic. Hypotension, asymptomatic, in setting of GI bleed, improved  Plan: Discontinue amiodarone drip Start amiodarone 200 mg BID Continue to hold Eliquis Defer further cardiac diagnostics at this time Follow up with Dr. [name] in 1 week; appointment request made.   Leanora Ivanoff, PA-C 12/03/2020 9:03 AM

## 2020-12-03 NOTE — Progress Notes (Signed)
Noted IV site on distal right arm had some redness and pain associated with it.   On assessment noted possible phlebitis.  Removed IV and elevated arm.   Will continue to monitor site.

## 2020-12-03 NOTE — Progress Notes (Addendum)
PROGRESS NOTE    Jane Campbell  UYQ:[id number] DOB: February 19, 1926 DOA: 11/30/2020 PCP: Oneita Hurt, No    Brief Narrative:  Jane Campbell is a 87 year old female with past medical history significant for paroxysmal atrial fibrillation, CAD, type 2 diabetes mellitus, essential hypertension, chronic lower extremity edema, hypothyroidism, recently started on anticoagulation with Eliquis 11/09/2020 who presented to [facility] ED on 10/24 with complaints of crampy abdominal pain, rectal bleeding with melena/bright red blood.  Also reports weakness at home generalized fatigue and had to lower herself to the ground, no syncopal episode.  In the ED, afebrile, BP 120/90, HR 150, RR 22, SPO2 98% on room air.  WBC 12.9, hemoglobin 11.2 Hgb 12.3 one month ago), platelets 195, sodium 137, potassium 3.3, chloride 98, CO2 31, BUN 56, creatinine 1.05.  INR 1.5, glucose 168.  FOBT positive.  Gastroenterology consulted.  TRH consulted for further evaluation and management of GI bleed.  Assessment & Plan:   Principal Problem:   GI bleed Active Problems:   Atrial fibrillation with RVR (HCC)   Essential hypertension   Hyperlipidemia   CAD (coronary artery disease)   Edema   Diabetes (HCC)   Hypokalemia   Acute upper GI bleed 2/2 duodenal ulcer Patient presenting to ED with generalized weakness, fatigue, crampy abdominal pain and rectal bleeding.  Recently started on anticoagulation with Eliquis on 11/09/2020.  FOBT positive.  Hemoglobin 11.2 on admission.  Underwent EGD on 12/02/2020 with findings of nonbleeding duodenal ulcer with nonbleeding visible vessel treated with epinephrine injection and bipolar cautery. --Gastroenterology following, appreciate assistance --Hgb 11.2>11.5>9.4>8.4>9.2 --Eliquis discontinued --Continue Protonix drip for 72 hours -- Carafate 1 g p.o. TIDAC/HS -- Clear liquid diet --H/H q12h and repeat CBC in the a.m.  Paroxysmal atrial fibrillation with RVR Initially started on a diltiazem drip which  was discontinued due to hypotension. --Cardiology following, appreciate assistance --Anticoagulation discontinued due to GI bleed as above --Amiodarone 200 mg BID --Continue monitor on telemetry --Outpatient follow-up with Dr. [name] 1 week after discharge  Hypokalemia Repleted, potassium 4.1 this morning. --Repeat electrolytes in a.m.  Left lower extremity cellulitis --Ceftriaxone 2 g IV every 24 hours  Hypomagnesemia Magnesium 1.7 this morning, will replete. --Repeat magnesium level in a.m.  Essential hypertension On metoprolol succinate 50 mg p.o. twice daily at home. --BP 101/64 this morning, borderline hypotensive --Continue to hold home metoprolol while on amiodarone drip as above --Continue monitor BP closely in the setting of GI bleed  Hyperlipidemia: Atorvastatin 10 mg p.o. daily  Type 2 diabetes mellitus Diet controlled at home.  Hemoglobin A1c 5.7, well controlled. --SSI for coverage --CBGs qAC/HS  Hypothyroidism TSH 1.329, free T4 1.73, T3 51. --Levothyroxine 125 mcg p.o. daily  Anxiety: Alprazolam 0.25 mg p.o. 3 times daily as needed anxiety  Weakness/deconditioning/debility: --OT recommending SNF --PT evaluation pending   DVT prophylaxis: SCDs Start: 12/01/20 0402   Code Status: DNR Family Communication: No family present at bedside this morning.  Disposition Plan:  Level of care: Progressive Cardiac Status is: Inpatient  Remains inpatient appropriate because: Continues on Protonix drip, starting clear liquid diet today    Consultants:  Cardiology - signed off 10/27 Gastroenterology  Procedures:  EGD 10/26  Antimicrobials:  Ceftriaxone 10/25>>   Subjective: Patient seen examined at bedside, resting comfortably.  Patient is hungry, wants something to eat.  GI starting clear liquid diet today.  Hemoglobin continues to drop this morning, but up to 9.2 this afternoon.  No other specific complaints or concerns at this time.  Denies  headache,  no dizziness, no chest pain, palpitation, no shortness of breath, no abdominal pain.  No acute events overnight per nursing staff.  Objective: Vitals:   12/03/20 0457 12/03/20 0600 12/03/20 0745 12/03/20 1126  BP: (!) 156/76 (!) 123/103 115/80 108/87  Pulse: (!) 111 77 (!) 103 (!) 110  Resp: 18 17 18 18   Temp: 97.6 F (36.4 C)  98 F (36.7 C) 97.9 F (36.6 C)  TempSrc:      SpO2: 100%  100% 97%  Weight:      Height:        Intake/Output Summary (Last 24 hours) at 12/03/2020 1330 Last data filed at 12/02/2020 1804 Gross per 24 hour  Intake 300 ml  Output 350 ml  Net -50 ml   Filed Weights   12/02/20 1247  Weight: 72.6 kg    Examination:  General exam: Appears calm and comfortable, elderly in appearance Respiratory system: Clear to auscultation. Respiratory effort normal.  On 2 L nasal cannula with SPO2 100% at rest Cardiovascular system: S1 & S2 heard, RRR. No JVD, murmurs, rubs, gallops or clicks. No pedal edema. Gastrointestinal system: Abdomen is nondistended, soft and nontender. No organomegaly or masses felt. Normal bowel sounds heard. Central nervous system: Alert and oriented. No focal neurological deficits. Extremities: Symmetric 5 x 5 power. Skin: No rashes, lesions or ulcers Psychiatry: Judgement and insight appear normal. Mood & affect appropriate.     Data Reviewed: I have personally reviewed following labs and imaging studies  CBC: Recent Labs  Lab 11/30/20 2108 12/01/20 0019 12/01/20 0648 12/01/20 1241 12/01/20 1804 12/02/20 0755 12/03/20 0555 12/03/20 1158  WBC 12.9*   < > 10.1 10.7* 12.8* 11.5* 10.9*  --   NEUTROABS 10.4*  --   --   --   --  9.4*  --   --   HGB 11.2*   < > 9.0* 8.7* 9.0* 9.4* 8.4* 9.2*  HCT 33.6*   < > 28.0* 27.4* 28.5* 29.6* 27.3* 29.6*  MCV 99.7   < > 100.7* 100.4* 104.0* 101.4* 104.6*  --   PLT 195   < > 159 157 152 147* 133*  --    < > = values in this interval not displayed.   Basic Metabolic Panel: Recent  Labs  Lab 11/30/20 2108 12/01/20 0648 12/02/20 0755 12/03/20 0555  NA 137 139 138 139  K 3.3* 2.8* 4.5 4.1  CL 98 101 104 110  CO2 31 29 25 25   GLUCOSE 168* 123* 147* 130*  BUN 56* 56* 49* 42*  CREATININE 1.05* 0.91 1.09* 1.04*  CALCIUM 8.5* 7.7* 8.4* 8.6*  MG  --  1.6* 1.6* 1.7  PHOS  --  2.6 2.5  --    GFR: Estimated Creatinine Clearance: 33.7 mL/min (A) (by C-G formula based on SCr of 1.04 mg/dL (H)). Liver Function Tests: Recent Labs  Lab 11/30/20 2108 12/01/20 0648 12/02/20 0755  AST 21 18 21   ALT 48* 35 34  ALKPHOS 59 46 52  BILITOT 1.4* 1.2 0.9  PROT 5.2* 4.2* 5.0*  ALBUMIN 3.1* 2.6* 3.1*   Recent Labs  Lab 11/30/20 2108  LIPASE 40   No results for input(s): AMMONIA in the last 168 hours. Coagulation Profile: Recent Labs  Lab 11/30/20 2108  INR 1.5*   Cardiac Enzymes: No results for input(s): CKTOTAL, CKMB, CKMBINDEX, TROPONINI in the last 168 hours. BNP (last 3 results) No results for input(s): PROBNP in the last 8760 hours. HbA1C: Recent Labs    12/03/20 0555  HGBA1C 5.7*   CBG: No results for input(s): GLUCAP in the last 168 hours. Lipid Profile: No results for input(s): CHOL, HDL, LDLCALC, TRIG, CHOLHDL, LDLDIRECT in the last 72 hours. Thyroid Function Tests: Recent Labs    12/01/20 0648  TSH 1.329  FREET4 1.73*   Anemia Panel: No results for input(s): VITAMINB12, FOLATE, FERRITIN, TIBC, IRON, RETICCTPCT in the last 72 hours. Sepsis Labs: No results for input(s): PROCALCITON, LATICACIDVEN in the last 168 hours.  Recent Results (from the past 240 hour(s))  Resp Panel by RT-PCR (Flu A&B, Covid) Nasopharyngeal Swab     Status: None   Collection Time: 11/30/20 10:53 PM   Specimen: Nasopharyngeal Swab; Nasopharyngeal(NP) swabs in vial transport medium  Result Value Ref Range Status   SARS Coronavirus 2 by RT PCR NEGATIVE NEGATIVE Final    Comment: (NOTE) SARS-CoV-2 target nucleic acids are NOT DETECTED.  The SARS-CoV-2 RNA is  generally detectable in upper respiratory specimens during the acute phase of infection. The lowest concentration of SARS-CoV-2 viral copies this assay can detect is 138 copies/mL. A negative result does not preclude SARS-Cov-2 infection and should not be used as the sole basis for treatment or other patient management decisions. A negative result may occur with  improper specimen collection/handling, submission of specimen other than nasopharyngeal swab, presence of viral mutation(s) within the areas targeted by this assay, and inadequate number of viral copies(<138 copies/mL). A negative result must be combined with clinical observations, patient history, and epidemiological information. The expected result is Negative.  Fact Sheet for Patients:  BloggerCourse.com  Fact Sheet for Healthcare Providers:  SeriousBroker.it  This test is no t yet approved or cleared by the [place] FDA and  has been authorized for detection and/or diagnosis of SARS-CoV-2 by FDA under an Emergency Use Authorization (EUA). This EUA will remain  in effect (meaning this test can be used) for the duration of the COVID-19 declaration under Section 564(b)(1) of the Act, 21 U.S.C.section 360bbb-3(b)(1), unless the authorization is terminated  or revoked sooner.       Influenza A by PCR NEGATIVE NEGATIVE Final   Influenza B by PCR NEGATIVE NEGATIVE Final    Comment: (NOTE) The Xpert Xpress SARS-CoV-2/FLU/RSV plus assay is intended as an aid in the diagnosis of influenza from Nasopharyngeal swab specimens and should not be used as a sole basis for treatment. Nasal washings and aspirates are unacceptable for Xpert Xpress SARS-CoV-2/FLU/RSV testing.  Fact Sheet for Patients: BloggerCourse.com  Fact Sheet for Healthcare Providers: SeriousBroker.it  This test is not yet approved or cleared by the [place] FDA and has been authorized for detection and/or diagnosis of SARS-CoV-2 by FDA under an Emergency Use Authorization (EUA). This EUA will remain in effect (meaning this test can be used) for the duration of the COVID-19 declaration under Section 564(b)(1) of the Act, 21 U.S.C. section 360bbb-3(b)(1), unless the authorization is terminated or revoked.  Performed at [facility], [address] [place], [place] [address]          Radiology Studies: No results found.      Scheduled Meds:  amiodarone  200 mg Oral BID   atorvastatin  10 mg Oral QHS   levothyroxine  125 mcg Oral QAC breakfast   metoprolol tartrate  12.5 mg Oral BID   multivitamin with minerals  1 tablet Oral Daily   [START ON 12/04/2020] pantoprazole  40 mg Intravenous Q12H   Continuous Infusions:  cefTRIAXone (ROCEPHIN)  IV Stopped (12/01/20 1833)  pantoprazole 8 mg/hr (12/03/20 0247)     LOS: 3 days    Time spent: 37 minutes spent on chart review, discussion with nursing staff, consultants, updating family and interview/physical exam; more than 50% of that time was spent in counseling and/or coordination of care.    Alvira Philips [place], DO [facility] Available via Epic secure chat 7am-7pm After these hours, please refer to coverage provider listed on [email or web address] 12/03/2020, 1:30 PM

## 2020-12-03 NOTE — Care Management Important Message (Signed)
Important Message  Patient Details  Name: Jane Campbell MRN: [id number] Date of Birth: 1926/05/16   Medicare Important Message Given:  Yes     Johnell Comings 12/03/2020, 3:19 PM

## 2020-12-03 NOTE — Evaluation (Signed)
Occupational Therapy Evaluation Patient Details Name: Jane Campbell MRN: [id number] DOB: [birth year] Today's Date: 12/03/2020   History of Present Illness 87 year old female with past medical history significant for paroxysmal atrial fibrillation, CAD, type 2 diabetes mellitus, essential hypertension, chronic lower extremity edema, hypothyroidism, recently started on anticoagulation with Eliquis 11/09/2020 who presented to [facility] ED on 10/24 with complaints of crampy abdominal pain, rectal bleeding with melena/bright red blood.  Also reports weakness at home generalized fatigue and had to lower herself to the ground, no syncopal episode. Pt found to have acute upper GI bleed 2/2 duodenal ulcer   Clinical Impression   Pt seen for OT evaluation this date. Upon arrival to room, pt awake and seated upright in bed on 2L of supplemental O2 (RN reports for comfort). Pt A&Ox4 and reporting no pain. Prior to admission, pt was MOD-I with RW for functional mobility and ADLs (with exception of receiving supervision for shower transfers), living in an assistive living facility. Pt denies any recent falls and denies using supplemental O2 at baseline. Pt currently presents with decreased activity tolerance and strength. Due to these current functional impairments, pt requires MIN A for bed mobility, MIN GUARD for seated LB ADLs, and MOD A for sit<>stand transfers. Following stand pivot transfer bed>recliner, pt reporting fatigue and SOB (SpO2 >92%, HR 120s during mobility). Pt unable to participate in standing ADLs or further functional mobility this date d/t decreased activity tolerance and strength. Pt would benefit from additional skilled OT services to maximize return to PLOF and minimize risk of future falls, injury, and readmission. Upon discharge, recommend SNF.      Recommendations for follow up therapy are one component of a multi-disciplinary discharge planning process, led by the attending physician.   Recommendations may be updated based on patient status, additional functional criteria and insurance authorization.   Follow Up Recommendations  Skilled nursing-short term rehab (<3 hours/day)    Assistance Recommended at Discharge Frequent or constant Supervision/Assistance  Functional Status Assessment  Patient has had a recent decline in their functional status and demonstrates the ability to make significant improvements in function in a reasonable and predictable amount of time.  Equipment Recommendations  None recommended by OT       Precautions / Restrictions Precautions Precautions: Fall Restrictions Weight Bearing Restrictions: No      Mobility Bed Mobility Overal bed mobility: Needs Assistance Bed Mobility: Supine to Sit;Sit to Supine     Supine to sit: Min guard;HOB elevated Sit to supine: Min assist;HOB elevated   General bed mobility comments: With use of bedrails/HOB elevated, pt able to complete sit>stand with increased time/effort. Requires MIN A for assisting LE during sit>supine    Transfers Overall transfer level: Needs assistance Equipment used: Rolling walker (2 wheels) Transfers: Sit to/from [employer] Transfers Sit to Stand: Mod assist Stand pivot transfers: Min guard         General transfer comment: x2 bouts, requiring MOD A for upward momentum. Once upight, demonstrates fair standing balance with RW      Balance Overall balance assessment: Needs assistance Sitting-balance support: No upper extremity supported;Feet unsupported Sitting balance-Leahy Scale: Good Sitting balance - Comments: Good sitting balance reaching outside BOS to don/doff socks   Standing balance support: Bilateral upper extremity supported;During functional activity Standing balance-Leahy Scale: Fair Standing balance comment: Requires MIN GUARD and b/l UE support from RW for stand pivot transfers  ADL either performed or  assessed with clinical judgement   ADL Overall ADL's : Needs assistance/impaired                                       General ADL Comments: SUPERVISION/SET-UP assist for seated UB ADLs at EOB. MIN GUARD for seated LB ADLs d/t fatigue when bending outside BOS (required x2 rest breaks to don/doff socks). Pt unable to engage in standing ADLs this date d/t fatigue     Vision Baseline Vision/History: 1 Wears glasses              Pertinent Vitals/Pain Pain Assessment: No/denies pain     Hand Dominance Right   Extremity/Trunk Assessment Upper Extremity Assessment Upper Extremity Assessment: Generalized weakness (shoulder flexion 3+/5, elbow flexion/ext 4/5, grip strength WFL)   Lower Extremity Assessment Lower Extremity Assessment: Generalized weakness       Communication Communication Communication: HOH   Cognition Arousal/Alertness: Awake/alert Behavior During Therapy: WFL for tasks assessed/performed Overall Cognitive Status: Within Functional Limits for tasks assessed                                 General Comments: A&Ox4. Pleasant and agreeable throughout, however fatigues quickly     General Comments  Upon arrival to room, pt on 2L/min of supplemental O2 (with RN reporting for comfort d/t pt reporting SOB). Once [place] removed, SpO2 95% on RA while supine in bed. Resting HR 106. During bed mobility/functional mobility, HR increasing to 120s (MAX 127), SpO2 >92%. At end of session, pt left seated in recliner, in no acute distress, with [place] donned and HR returning to resting HR. RN informed of vitals during session.            Home Living Family/patient expects to be discharged to:: Assisted living [facility])                             Home Equipment: Agricultural consultant (2 wheels);Shower seat - built in;Grab bars - tub/shower;BSC          Prior Functioning/Environment Prior Level of Function : Independent/Modified  Independent             Mobility Comments: MOD-I with RW. Denies fall hx ADLs Comments: Independent with ADLs (supervision for showers). ALF staff assists with IADLs. Pt is a retired Advertising account executive Problem List: Decreased strength;Decreased activity tolerance;Impaired balance (sitting and/or standing)      OT Treatment/Interventions: Self-care/ADL training;Therapeutic exercise;Energy conservation;DME and/or AE instruction;Therapeutic activities;Patient/family education;Balance training    OT Goals(Current goals can be found in the care plan section) Acute Rehab OT Goals Patient Stated Goal: to "do whatever it takes" to return to PLOF OT Goal Formulation: With patient Time For Goal Achievement: 12/17/20 Potential to Achieve Goals: Good ADL Goals Pt Will Perform Grooming: with min assist;standing Pt Will Perform Lower Body Dressing: with min assist;sit to/from stand Pt Will Transfer to Toilet: with min assist;stand pivot transfer;bedside commode  OT Frequency: Min 1X/week    AM-PAC OT "6 Clicks" Daily Activity     Outcome Measure Help from another person eating meals?: None Help from another person taking care of personal grooming?: A Little Help from another person toileting, which includes using toliet, bedpan, or urinal?: A Lot  Help from another person bathing (including washing, rinsing, drying)?: A Lot Help from another person to put on and taking off regular upper body clothing?: A Little Help from another person to put on and taking off regular lower body clothing?: A Lot 6 Click Score: 16   End of Session Equipment Utilized During Treatment: Rolling walker (2 wheels) Nurse Communication: Mobility status;Other (comment) (vitals during OOB mobility)  Activity Tolerance: Patient tolerated treatment well Patient left: in chair;with call bell/phone within reach;with chair alarm set  OT Visit Diagnosis: Unsteadiness on feet (R26.81);Muscle weakness (generalized) (M62.81)                 Time: 6712-4580 OT Time Calculation (min): 41 min Charges:  OT General Charges $OT Visit: 1 Visit OT Evaluation $OT Eval Moderate Complexity: 1 Mod OT Treatments $Self Care/Home Management : 8-22 mins $Therapeutic Activity: 8-22 mins  Matthew Folks, OTR/L [facility] [phone]

## 2020-12-03 NOTE — Progress Notes (Signed)
Jane Minium, MD [facility]   [address], [address] [place], [place] [address] Phone: [phone] Fax : [phone]   Subjective: This patient underwent an upper endoscopy with a visible vessel treated with cautery and epinephrine.  The patient's hemoglobin did drop from 9.4 to 8.4 But a repeat this afternoon shows to be 9.2.  The patient denies any abdominal pain but states that she feels very weak and unable to stand. She denies any abdominal pain and cannot tell me if she had any rectal bleeding since the endoscopy.   Objective: Vital signs in last 24 hours: Vitals:   12/03/20 0600 12/03/20 0745 12/03/20 1126 12/03/20 1530  BP: (!) 123/103 115/80 108/87 108/82  Pulse: 77 (!) 103 (!) 110 (!) 108  Resp: 17 18 18 18   Temp:  98 F (36.7 C) 97.9 F (36.6 C) 98.3 F (36.8 C)  TempSrc:    Oral  SpO2:  100% 97% 98%  Weight:      Height:       Weight change:   Intake/Output Summary (Last 24 hours) at 12/03/2020 1705 Last data filed at 12/03/2020 1533 Gross per 24 hour  Intake 480 ml  Output 900 ml  Net -420 ml     Exam: General: the patient is sitting in a chair and in no apparent distress.  Alert and oriented 3   Lab Results: @LABTEST2 @ Micro Results: Recent Results (from the past 240 hour(s))  Resp Panel by RT-PCR (Flu A&B, Covid) Nasopharyngeal Swab     Status: None   Collection Time: 11/30/20 10:53 PM   Specimen: Nasopharyngeal Swab; Nasopharyngeal(NP) swabs in vial transport medium  Result Value Ref Range Status   SARS Coronavirus 2 by RT PCR NEGATIVE NEGATIVE Final    Comment: (NOTE) SARS-CoV-2 target nucleic acids are NOT DETECTED.  The SARS-CoV-2 RNA is generally detectable in upper respiratory specimens during the acute phase of infection. The lowest concentration of SARS-CoV-2 viral copies this assay can detect is 138 copies/mL. A negative result does not preclude SARS-Cov-2 infection and should not be used as the sole basis for treatment or other patient  management decisions. A negative result may occur with  improper specimen collection/handling, submission of specimen other than nasopharyngeal swab, presence of viral mutation(s) within the areas targeted by this assay, and inadequate number of viral copies(<138 copies/mL). A negative result must be combined with clinical observations, patient history, and epidemiological information. The expected result is Negative.  Fact Sheet for Patients:   Fact Sheet for Healthcare Providers:  12/02/20  This test is no t yet approved or cleared by the [employer] FDA and  has been authorized for detection and/or diagnosis of SARS-CoV-2 by FDA under an Emergency Use Authorization (EUA). This EUA will remain  in effect (meaning this test can be used) for the duration of the COVID-19 declaration under Section 564(b)(1) of the Act, 21 U.S.C.section 360bbb-3(b)(1), unless the authorization is terminated  or revoked sooner.       Influenza A by PCR NEGATIVE NEGATIVE Final   Influenza B by PCR NEGATIVE NEGATIVE Final    Comment: (NOTE) The Xpert Xpress SARS-CoV-2/FLU/RSV plus assay is intended as an aid in the diagnosis of influenza from Nasopharyngeal swab specimens and should not be used as a sole basis for treatment. Nasal washings and aspirates are unacceptable for Xpert Xpress SARS-CoV-2/FLU/RSV testing.  Fact Sheet for Patients: [employer]ious[employer]ker[employer]it  Fact Sheet for Healthcare Providers: [place]  This test is not yet approved or cleared by the [employer]  States FDA and has been authorized for detection and/or diagnosis of SARS-CoV-2 by FDA under an Emergency Use Authorization (EUA). This EUA will remain in effect (meaning this test can be used) for the duration of the COVID-19 declaration under Section 564(b)(1) of the Act, 21 U.S.C. section 360bbb-3(b)(1),  unless the authorization is terminated or revoked.  Performed at [facility], [address], [place], [place] [address]    Studies/Results: No results found. Medications: I have reviewed the patient's current medications. Scheduled Meds:  amiodarone  200 mg Oral BID   atorvastatin  10 mg Oral QHS   levothyroxine  125 mcg Oral QAC breakfast   metoprolol tartrate  12.5 mg Oral BID   multivitamin with minerals  1 tablet Oral Daily   [START ON 12/04/2020] pantoprazole  40 mg Intravenous Q12H   sucralfate  1 g Oral TID WC & HS   Continuous Infusions:  cefTRIAXone (ROCEPHIN)  IV 2 g (12/03/20 1427)   pantoprazole 8 mg/hr (12/03/20 0247)   PRN Meds:.albuterol, ALPRAZolam, fluticasone, ondansetron **OR** ondansetron (ZOFRAN) IV   Assessment: Principal Problem:   GI bleed Active Problems:   Atrial fibrillation with RVR (HCC)   Essential hypertension   Hyperlipidemia   CAD (coronary artery disease)   Edema   Diabetes (HCC)   Hypokalemia    Plan: This patient has had a fluctuation in her hemoglobin with a visible vessel found on endoscopy yesterday.  The patient will have her hemoglobin followed and if it continues to drop she has been told that she may need a repeat upper endoscopy to make sure that the visible vessel has not started bleeding again.  The patient has been explained the plan and agrees with it.   LOS: 3 days   [name] 12/03/2020, 5:05 PM Pager [phone] 7am-5pm  Check AMION for 5pm -7am coverage and on weekends

## 2020-12-03 NOTE — Evaluation (Signed)
Physical Therapy Evaluation Patient Details Name: Jane Campbell MRN: [id number] DOB: 1926/04/19 Today's Date: 12/03/2020  History of Present Illness  Pt is a 87 y.o. female presenting to hospital 10/24 with c/o nausea, crampy abdominal pain, and rectal bleeding; generalized weakness and had to lower self to ground.  Pt admitted with GI bleed, a-fib with RVR, hypotension, hypokalemia, and B LE edema.  S/p EGD 10/26 (found to have acute upper GI bleed 2/2 duodenal ulcer).  PMH includes a-fib, DM, htn, on Eliquis, large hiatal hernia.  Clinical Impression  Prior to hospital admission, pt was modified independent ambulating with RW; lives at [facility] ALF.  Currently pt is mod assist to stand from recliner and CGA to ambulate 8 feet with RW (SOB noted with activity).  Pt's HR fluctuating between 100-125 bpm during sessions activities.  Generalized weakness and decreased activity tolerance noted during sessions activities.  Pt would benefit from skilled PT to address noted impairments and functional limitations (see below for any additional details).  Upon hospital discharge, pt would benefit from SNF.       Recommendations for follow up therapy are one component of a multi-disciplinary discharge planning process, led by the attending physician.  Recommendations may be updated based on patient status, additional functional criteria and insurance authorization.  Follow Up Recommendations Skilled nursing-short term rehab (<3 hours/day)    Assistance Recommended at Discharge    Functional Status Assessment Patient has had a recent decline in their functional status and demonstrates the ability to make significant improvements in function in a reasonable and predictable amount of time.  Equipment Recommendations  Rolling walker (2 wheels);3in1 (PT)    Recommendations for Other Services OT consult     Precautions / Restrictions Precautions Precautions: Fall Restrictions Weight Bearing Restrictions:  No      Mobility  Bed Mobility Overal bed mobility: Needs Assistance Bed Mobility: Sit to Supine     Sit to supine: Min assist (assist for trunk and B LE's; 2 assist to boost up in bed using bed sheet)   General bed mobility comments: increased effort for pt to perform    Transfers Overall transfer level: Needs assistance Equipment used: Rolling walker (2 wheels) Transfers: Sit to/from Stand Sit to Stand: Mod assist Stand pivot transfers: Min guard (stand step turn recliner to bed with RW)         General transfer comment: assist to initiate and come to full stand up to walker; vc's for UE/LE placement    Ambulation/Gait Ambulation/Gait assistance: Min guard Gait Distance (Feet): 8 Feet Assistive device: Rolling walker (2 wheels)   Gait velocity: decreased   General Gait Details: partial step through gait pattern  Stairs            Wheelchair Mobility    Modified Rankin (Stroke Patients Only)       Balance Overall balance assessment: Needs assistance Sitting-balance support: No upper extremity supported;Feet unsupported Sitting balance-Leahy Scale: Good Sitting balance - Comments: steady sitting reaching within BOS   Standing balance support: Single extremity supported Standing balance-Leahy Scale: Fair Standing balance comment: pt requiring at least single UE support for static standing balance                             Pertinent Vitals/Pain Pain Assessment: No/denies pain    Home Living Family/patient expects to be discharged to:: Assisted living [facility])  Home Equipment: Agricultural consultant (2 wheels);Shower seat - built in;Grab bars - tub/shower;BSC      Prior Function Prior Level of Function : Independent/Modified Independent             Mobility Comments: Modifed independent ambulating with RW.  No other recent falls.  Pt is a retired Engineer, civil (consulting). ADLs Comments: Per OT eval "Independent with ADLs  (supervision for showers). ALF staff assists with IADLs."     Hand Dominance   Dominant Hand: Right    Extremity/Trunk Assessment   Upper Extremity Assessment Upper Extremity Assessment: Defer to OT evaluation    Lower Extremity Assessment Lower Extremity Assessment: Generalized weakness       Communication   Communication: HOH  Cognition Arousal/Alertness: Awake/alert Behavior During Therapy: WFL for tasks assessed/performed Overall Cognitive Status: Within Functional Limits for tasks assessed                                 General Comments: A&O x4        General Comments General comments (skin integrity, edema, etc.): R UE swelling noted (nurse reporting pt's IV infiltrated earlier).  Nursing cleared PT to take O2 (nasal cannula) off (O2 was for comfort) and to keep off end of session (pt agreeable to this); O2 sats 96% or greater on room air during sessions activities.    Exercises     Assessment/Plan    PT Assessment Patient needs continued PT services  PT Problem List Decreased strength;Decreased activity tolerance;Decreased balance;Decreased mobility       PT Treatment Interventions DME instruction;Gait training;Functional mobility training;Therapeutic activities;Therapeutic exercise;Balance training;Patient/family education    PT Goals (Current goals can be found in the Care Plan section)  Acute Rehab PT Goals Patient Stated Goal: to improve overall strength and mobility PT Goal Formulation: With patient Time For Goal Achievement: 12/17/20 Potential to Achieve Goals: Good    Frequency Min 2X/week   Barriers to discharge Decreased caregiver support      Co-evaluation               AM-PAC PT "6 Clicks" Mobility  Outcome Measure Help needed turning from your back to your side while in a flat bed without using bedrails?: None Help needed moving from lying on your back to sitting on the side of a flat bed without using bedrails?: A  Little Help needed moving to and from a bed to a chair (including a wheelchair)?: A Little Help needed standing up from a chair using your arms (e.g., wheelchair or bedside chair)?: A Lot Help needed to walk in hospital room?: A Lot Help needed climbing 3-5 steps with a railing? : Total 6 Click Score: 15    End of Session Equipment Utilized During Treatment: Gait belt Activity Tolerance: Patient limited by fatigue Patient left: in bed;with call bell/phone within reach;with bed alarm set Nurse Communication: Mobility status;Precautions;Other (comment) (pt's HR during session) PT Visit Diagnosis: Other abnormalities of gait and mobility (R26.89);Muscle weakness (generalized) (M62.81)    Time: 2353-6144 PT Time Calculation (min) (ACUTE ONLY): 26 min   Charges:   PT Evaluation $PT Eval Low Complexity: 1 Low PT Treatments $Therapeutic Activity: 8-22 mins       Hendricks Limes, PT 12/03/20, 3:10 PM

## 2020-12-03 NOTE — Anesthesia Postprocedure Evaluation (Signed)
Anesthesia Post Note  Patient: Taleshia Luff  Procedure(s) Performed: ESOPHAGOGASTRODUODENOSCOPY (EGD) WITH PROPOFOL  Patient location during evaluation: PACU Anesthesia Type: General Level of consciousness: awake and alert Pain management: pain level controlled Vital Signs Assessment: post-procedure vital signs reviewed and stable Respiratory status: spontaneous breathing, nonlabored ventilation, respiratory function stable and patient connected to nasal cannula oxygen Cardiovascular status: blood pressure returned to baseline and stable Postop Assessment: no apparent nausea or vomiting Anesthetic complications: no   No notable events documented.   Last Vitals:  Vitals:   12/03/20 1530 12/03/20 1949  BP: 108/82 111/81  Pulse: (!) 108 91  Resp: 18 18  Temp: 36.8 C 36.4 C  SpO2: 98% 98%    Last Pain:  Vitals:   12/03/20 1949  TempSrc:   PainSc: 0-No pain                 Foye Deer

## 2020-12-04 DIAGNOSIS — K922 Gastrointestinal hemorrhage, unspecified: Secondary | ICD-10-CM | POA: Diagnosis not present

## 2020-12-04 DIAGNOSIS — I2510 Atherosclerotic heart disease of native coronary artery without angina pectoris: Secondary | ICD-10-CM | POA: Diagnosis not present

## 2020-12-04 DIAGNOSIS — E119 Type 2 diabetes mellitus without complications: Secondary | ICD-10-CM | POA: Diagnosis not present

## 2020-12-04 DIAGNOSIS — I4891 Unspecified atrial fibrillation: Secondary | ICD-10-CM | POA: Diagnosis not present

## 2020-12-04 LAB — BASIC METABOLIC PANEL
Anion gap: 5 (ref 5–15)
BUN: 37 mg/dL — ABNORMAL HIGH (ref 8–23)
CO2: 26 mmol/L (ref 22–32)
Calcium: 8.4 mg/dL — ABNORMAL LOW (ref 8.9–10.3)
Chloride: 106 mmol/L (ref 98–111)
Creatinine, Ser: 1.09 mg/dL — ABNORMAL HIGH (ref 0.44–1.00)
GFR, Estimated: 48 mL/min — ABNORMAL LOW (ref >60)
Glucose, Bld: 107 mg/dL — ABNORMAL HIGH (ref 70–99)
Potassium: 4 mmol/L (ref 3.5–5.1)
Sodium: 137 mmol/L (ref 135–145)

## 2020-12-04 LAB — CBC
HCT: 27 % — ABNORMAL LOW (ref 36.0–46.0)
Hemoglobin: 8.6 g/dL — ABNORMAL LOW (ref 12.0–15.0)
MCH: 32.5 pg (ref 26.0–34.0)
MCHC: 31.9 g/dL (ref 30.0–36.0)
MCV: 101.9 fL — ABNORMAL HIGH (ref 80.0–100.0)
Platelets: 121 10*3/uL — ABNORMAL LOW (ref 150–400)
RBC: 2.65 MIL/uL — ABNORMAL LOW (ref 3.87–5.11)
RDW: 15.9 % — ABNORMAL HIGH (ref 11.5–15.5)
WBC: 9 10*3/uL (ref 4.0–10.5)
nRBC: 0.2 % (ref 0.0–0.2)

## 2020-12-04 LAB — HEMOGLOBIN AND HEMATOCRIT, BLOOD
HCT: 25.8 % — ABNORMAL LOW (ref 36.0–46.0)
Hemoglobin: 8.2 g/dL — ABNORMAL LOW (ref 12.0–15.0)

## 2020-12-04 LAB — MAGNESIUM: Magnesium: 2.1 mg/dL (ref 1.7–2.4)

## 2020-12-04 NOTE — TOC Progression Note (Signed)
Transition of Care [facility]) - Progression Note    Patient Details  Name: Jane Campbell MRN: [id number] Date of Birth: 04/06/1926  [facility]) CM/SW [place], LCSW Phone Number: 12/04/2020, 10:49 AM  Clinical Narrative:   Met with patient at bedside. Informed her of SNF recommendation from PT. Patient agreeable. Says she has had both COVID vaccines and boosters. No SNF preference as she is not familiar with the ones in this area. Michela Pitcher it is ok to discuss DC planning with her daughter as well. CSW will start work up.         Expected Discharge Plan and Services                                                 Social Determinants of Health (SDOH) Interventions    Readmission Risk Interventions No flowsheet data found.

## 2020-12-04 NOTE — NC FL2 (Signed)
[place] MEDICAID FL2 LEVEL OF CARE SCREENING TOOL     IDENTIFICATION  Patient Name: Jane Campbell Birthdate: 10-20-1926 Sex: female Admission Date (Current Location): 11/30/2020  [place] and [place] Number:  Chiropodist and Address:  [facility], [address], [place], [place] [address]      Provider Number: [id number]  Attending Physician Name and Address:  [place], Eric J, DO  Relative Name and Phone Number:  Trenia, Tennyson (Daughter)   [phone] [facility])    Current Level of Care: Hospital Recommended Level of Care: Skilled Nursing Facility Prior Approval Number:    Date Approved/Denied:   PASRR Number: [id number]  Discharge Plan:      Current Diagnoses: Patient Active Problem List   Diagnosis Date Noted   Acute GI bleeding    GI bleed 11/30/2020   Edema 11/30/2020   Diabetes (HCC) 11/30/2020   Hypokalemia 11/30/2020   Atrial fibrillation with RVR (HCC) 11/03/2020   Essential hypertension 11/03/2020   Pyuria 11/03/2020   Hyperlipidemia 11/03/2020   CAD (coronary artery disease) 11/03/2020    Orientation RESPIRATION BLADDER Height & Weight     Self, Time, Situation, Place  Normal Incontinent Weight: 160 lb (72.6 kg) Height:  5\' 3"  (160 cm)  BEHAVIORAL SYMPTOMS/MOOD NEUROLOGICAL BOWEL NUTRITION STATUS        Diet (full liquid diet)  AMBULATORY STATUS COMMUNICATION OF NEEDS Skin   Limited Assist Verbally Bruising (moisture associated skin damage)                       Personal Care Assistance Level of Assistance  Bathing, Feeding, Dressing Bathing Assistance: Limited assistance Feeding assistance: Independent Dressing Assistance: Limited assistance     Functional Limitations Info             SPECIAL CARE FACTORS FREQUENCY  PT (By licensed PT), OT (By licensed OT)     PT Frequency: 5 times per week OT Frequency: 5 times per week            Contractures      Additional Factors Info  Code  Status, Allergies Code Status Info: DNR Allergies Info: Penicillins           Current Medications (12/04/2020):  This is the current hospital active medication list Current Facility-Administered Medications  Medication Dose Route Frequency Provider Last Rate Last Admin   albuterol (PROVENTIL) (2.5 MG/3ML) 0.083% nebulizer solution 2.5 mg  2.5 mg Inhalation Q6H PRN 12/06/2020 Latif, DO   2.5 mg at 12/01/20 2301   ALPRAZolam 2302) tablet 0.25 mg  0.25 mg Oral TID PRN Prudy Feeler Latif, DO   0.25 mg at 12/03/20 2131   amiodarone (PACERONE) tablet 200 mg  200 mg Oral BID 2132, PA-C   200 mg at 12/04/20 12/06/20   atorvastatin (LIPITOR) tablet 10 mg  10 mg Oral QHS 7989 [place]ta, DO   10 mg at 12/03/20 2131   cefTRIAXone (ROCEPHIN) 2 g in sodium chloride 0.9 % 100 mL IVPB  2 g Intravenous Q24H 2132 [place], DO 200 mL/hr at 12/04/20 1259 2 g at 12/04/20 1259   fluticasone (FLONASE) 50 MCG/ACT nasal spray 2 spray  2 spray Each Nare Daily PRN 12/06/20 Latif, DO       levothyroxine (SYNTHROID) tablet 125 mcg  125 mcg Oral QAC breakfast 09-10-2003 Goltry, Asprogia   125 mcg at 12/04/20 12/06/20   metoprolol tartrate (LOPRESSOR) tablet 12.5 mg  12.5 mg Oral BID 2119,  Tobi Bastos, PA-C   12.5 mg at 12/04/20 3614   multivitamin with minerals tablet 1 tablet  1 tablet Oral Daily Marguerita Merles [place], [place]   1 tablet at 12/04/20 0923   ondansetron (ZOFRAN) tablet 4 mg  4 mg Oral Q6H PRN Rometta Emery, MD       Or   ondansetron (ZOFRAN) injection 4 mg  4 mg Intravenous Q6H PRN Earlie Lou L, MD   4 mg at 12/01/20 0437   pantoprazole (PROTONIX) injection 40 mg  40 mg Intravenous Q12H Earlie Lou L, MD   40 mg at 12/04/20 1312   sucralfate (CARAFATE) 1 GM/10ML suspension 1 g  1 g Oral TID WC & HS [place], Eric J, DO   1 g at 12/04/20 1253     Discharge Medications: Please see discharge summary for a list of discharge medications.  Relevant Imaging Results:  Relevant Lab  Results:   Additional Information SS #: [id number]  Yan Pankratz E Yarnell Arvidson, LCSW

## 2020-12-04 NOTE — Progress Notes (Signed)
PROGRESS NOTE    Jane Campbell  IWP:[id number] DOB: 05-11-1926 DOA: 11/30/2020 PCP: Oneita Hurt, No    Brief Narrative:  Jane Campbell is a 87 year old female with past medical history significant for paroxysmal atrial fibrillation, CAD, type 2 diabetes mellitus, essential hypertension, chronic lower extremity edema, hypothyroidism, recently started on anticoagulation with Eliquis 11/09/2020 who presented to [facility] ED on 10/24 with complaints of crampy abdominal pain, rectal bleeding with melena/bright red blood.  Also reports weakness at home generalized fatigue and had to lower herself to the ground, no syncopal episode.  In the ED, afebrile, BP 120/90, HR 150, RR 22, SPO2 98% on room air.  WBC 12.9, hemoglobin 11.2 Hgb 12.3 one month ago), platelets 195, sodium 137, potassium 3.3, chloride 98, CO2 31, BUN 56, creatinine 1.05.  INR 1.5, glucose 168.  FOBT positive.  Gastroenterology consulted.  TRH consulted for further evaluation and management of GI bleed.  Assessment & Plan:   Principal Problem:   GI bleed Active Problems:   Atrial fibrillation with RVR (HCC)   Essential hypertension   Hyperlipidemia   CAD (coronary artery disease)   Edema   Diabetes (HCC)   Hypokalemia   Acute upper GI bleed 2/2 duodenal ulcer Patient presenting to ED with generalized weakness, fatigue, crampy abdominal pain and rectal bleeding.  Recently started on anticoagulation with Eliquis on 11/09/2020.  FOBT positive.  Hemoglobin 11.2 on admission.  Underwent EGD on 12/02/2020 with findings of nonbleeding duodenal ulcer with nonbleeding visible vessel treated with epinephrine injection and bipolar cautery. --Gastroenterology following, appreciate assistance --Hgb 11.2>11.5>9.4>8.4>9.2>>8.2>8.6 --Eliquis discontinued --Protonix 40mg  IV q12h --Carafate 1 g p.o. TIDAC/HS --Full liquid diet --Repeat CBC in the a.m.  Paroxysmal atrial fibrillation with RVR Initially started on a diltiazem drip which was discontinued due  to hypotension. --Cardiology following, appreciate assistance --Anticoagulation discontinued due to GI bleed as above --Amiodarone 200 mg BID --Continue monitor on telemetry --Outpatient follow-up with Dr. [name] 1 week after discharge  Hypokalemia Repleted, potassium 4.1 this morning. --Repeat electrolytes in a.m.  Left lower extremity cellulitis --Ceftriaxone 2 g IV every 24 hours  Hypomagnesemia: Resolved Magnesium 2.1 this morning  Essential hypertension On metoprolol succinate 50 mg p.o. twice daily at home. --BP 105/73 this morning --Holding home metoprolol, on amiodarone 200 mg twice daily --Continue monitor BP closely in the setting of GI bleed  Hyperlipidemia: Atorvastatin 10 mg p.o. daily  Type 2 diabetes mellitus Diet controlled at home.  Hemoglobin A1c 5.7, well controlled. --SSI for coverage --CBGs qAC/HS  Hypothyroidism TSH 1.329, free T4 1.73, T3 51. --Levothyroxine 125 mcg p.o. daily  Anxiety: Alprazolam 0.25 mg p.o. 3 times daily as needed anxiety  Weakness/deconditioning/debility: --PT/OT recommending SNF; TOC for placement   DVT prophylaxis: SCDs Start: 12/01/20 0402   Code Status: DNR Family Communication: No family present at bedside this morning.  Disposition Plan:  Level of care: Progressive Cardiac Status is: Inpatient  Remains inpatient appropriate because: Continues on Protonix drip, starting clear liquid diet today    Consultants:  Cardiology - signed off 10/27 Gastroenterology  Procedures:  EGD 10/26  Antimicrobials:  Ceftriaxone 10/25>>   Subjective: Patient seen examined at bedside, resting comfortably.  Only complaint is lab having difficulty obtaining labs.  Denies any bowel movements overnight.  Hemoglobin appears to be stable.  Discussed with GI this afternoon, no indication for repeat EGD at this time.  Advancing to full liquid diet today. No other specific complaints or concerns at this time.  Denies headache, no  dizziness, no chest  pain, palpitation, no shortness of breath, no abdominal pain.  No acute events overnight per nursing staff.  Objective: Vitals:   12/04/20 0000 12/04/20 0443 12/04/20 0813 12/04/20 1133  BP: 103/78 105/73 115/81 106/85  Pulse: 98 96 (!) 107 (!) 109  Resp: 18 18 16 17   Temp: 97.6 F (36.4 C) (!) 97.5 F (36.4 C) 97.9 F (36.6 C) 97.9 F (36.6 C)  TempSrc:      SpO2: 97% 98% 98% 98%  Weight:      Height:        Intake/Output Summary (Last 24 hours) at 12/04/2020 1323 Last data filed at 12/04/2020 1000 Gross per 24 hour  Intake 900 ml  Output 850 ml  Net 50 ml   Filed Weights   12/02/20 1247  Weight: 72.6 kg    Examination:  General exam: Appears calm and comfortable, elderly in appearance Respiratory system: Clear to auscultation. Respiratory effort normal.  On 2 L nasal cannula with SPO2 98% at rest Cardiovascular system: S1 & S2 heard, RRR. No JVD, murmurs, rubs, gallops or clicks. No pedal edema. Gastrointestinal system: Abdomen is nondistended, soft and nontender. No organomegaly or masses felt. Normal bowel sounds heard. Central nervous system: Alert and oriented. No focal neurological deficits. Extremities: Symmetric 5 x 5 power. Skin: No rashes, lesions or ulcers Psychiatry: Judgement and insight appear normal. Mood & affect appropriate.     Data Reviewed: I have personally reviewed following labs and imaging studies  CBC: Recent Labs  Lab 11/30/20 2108 12/01/20 0019 12/01/20 1241 12/01/20 1804 12/02/20 0755 12/03/20 0555 12/03/20 1158 12/04/20 0035 12/04/20 0826  WBC 12.9*   < > 10.7* 12.8* 11.5* 10.9*  --   --  9.0  NEUTROABS 10.4*  --   --   --  9.4*  --   --   --   --   HGB 11.2*   < > 8.7* 9.0* 9.4* 8.4* 9.2* 8.2* 8.6*  HCT 33.6*   < > 27.4* 28.5* 29.6* 27.3* 29.6* 25.8* 27.0*  MCV 99.7   < > 100.4* 104.0* 101.4* 104.6*  --   --  101.9*  PLT 195   < > 157 152 147* 133*  --   --  121*   < > = values in this interval not  displayed.   Basic Metabolic Panel: Recent Labs  Lab 11/30/20 2108 12/01/20 0648 12/02/20 0755 12/03/20 0555 12/04/20 0826  NA 137 139 138 139 137  K 3.3* 2.8* 4.5 4.1 4.0  CL 98 101 104 110 106  CO2 31 29 25 25 26   GLUCOSE 168* 123* 147* 130* 107*  BUN 56* 56* 49* 42* 37*  CREATININE 1.05* 0.91 1.09* 1.04* 1.09*  CALCIUM 8.5* 7.7* 8.4* 8.6* 8.4*  MG  --  1.6* 1.6* 1.7 2.1  PHOS  --  2.6 2.5  --   --    GFR: Estimated Creatinine Clearance: 32.1 mL/min (A) (by C-G formula based on SCr of 1.09 mg/dL (H)). Liver Function Tests: Recent Labs  Lab 11/30/20 2108 12/01/20 0648 12/02/20 0755  AST 21 18 21   ALT 48* 35 34  ALKPHOS 59 46 52  BILITOT 1.4* 1.2 0.9  PROT 5.2* 4.2* 5.0*  ALBUMIN 3.1* 2.6* 3.1*   Recent Labs  Lab 11/30/20 2108  LIPASE 40   No results for input(s): AMMONIA in the last 168 hours. Coagulation Profile: Recent Labs  Lab 11/30/20 2108  INR 1.5*   Cardiac Enzymes: No results for input(s): CKTOTAL, CKMB, CKMBINDEX, TROPONINI  in the last 168 hours. BNP (last 3 results) No results for input(s): PROBNP in the last 8760 hours. HbA1C: Recent Labs    12/03/20 0555  HGBA1C 5.7*   CBG: No results for input(s): GLUCAP in the last 168 hours. Lipid Profile: No results for input(s): CHOL, HDL, LDLCALC, TRIG, CHOLHDL, LDLDIRECT in the last 72 hours. Thyroid Function Tests: No results for input(s): TSH, T4TOTAL, FREET4, T3FREE, THYROIDAB in the last 72 hours.  Anemia Panel: No results for input(s): VITAMINB12, FOLATE, FERRITIN, TIBC, IRON, RETICCTPCT in the last 72 hours. Sepsis Labs: No results for input(s): PROCALCITON, LATICACIDVEN in the last 168 hours.  Recent Results (from the past 240 hour(s))  Resp Panel by RT-PCR (Flu A&B, Covid) Nasopharyngeal Swab     Status: None   Collection Time: 11/30/20 10:53 PM   Specimen: Nasopharyngeal Swab; Nasopharyngeal(NP) swabs in vial transport medium  Result Value Ref Range Status   SARS Coronavirus 2 by  RT PCR NEGATIVE NEGATIVE Final    Comment: (NOTE) SARS-CoV-2 target nucleic acids are NOT DETECTED.  The SARS-CoV-2 RNA is generally detectable in upper respiratory specimens during the acute phase of infection. The lowest concentration of SARS-CoV-2 viral copies this assay can detect is 138 copies/mL. A negative result does not preclude SARS-Cov-2 infection and should not be used as the sole basis for treatment or other patient management decisions. A negative result may occur with  improper specimen collection/handling, submission of specimen other than nasopharyngeal swab, presence of viral mutation(s) within the areas targeted by this assay, and inadequate number of viral copies(<138 copies/mL). A negative result must be combined with clinical observations, patient history, and epidemiological information. The expected result is Negative.  Fact Sheet for Patients:  BloggerCourse.com  Fact Sheet for Healthcare Providers:  SeriousBroker.it  This test is no t yet approved or cleared by the [place] FDA and  has been authorized for detection and/or diagnosis of SARS-CoV-2 by FDA under an Emergency Use Authorization (EUA). This EUA will remain  in effect (meaning this test can be used) for the duration of the COVID-19 declaration under Section 564(b)(1) of the Act, 21 U.S.C.section 360bbb-3(b)(1), unless the authorization is terminated  or revoked sooner.       Influenza A by PCR NEGATIVE NEGATIVE Final   Influenza B by PCR NEGATIVE NEGATIVE Final    Comment: (NOTE) The Xpert Xpress SARS-CoV-2/FLU/RSV plus assay is intended as an aid in the diagnosis of influenza from Nasopharyngeal swab specimens and should not be used as a sole basis for treatment. Nasal washings and aspirates are unacceptable for Xpert Xpress SARS-CoV-2/FLU/RSV testing.  Fact Sheet for Patients: BloggerCourse.com  Fact Sheet  for Healthcare Providers: SeriousBroker.it  This test is not yet approved or cleared by the [place] FDA and has been authorized for detection and/or diagnosis of SARS-CoV-2 by FDA under an Emergency Use Authorization (EUA). This EUA will remain in effect (meaning this test can be used) for the duration of the COVID-19 declaration under Section 564(b)(1) of the Act, 21 U.S.C. section 360bbb-3(b)(1), unless the authorization is terminated or revoked.  Performed at [facility], [address], [place], [place] [address]          Radiology Studies: No results found.      Scheduled Meds:  amiodarone  200 mg Oral BID   atorvastatin  10 mg Oral QHS   levothyroxine  125 mcg Oral QAC breakfast   metoprolol tartrate  12.5 mg Oral BID   multivitamin with minerals  1 tablet Oral Daily   pantoprazole  40 mg Intravenous Q12H   sucralfate  1 g Oral TID WC & HS   Continuous Infusions:  cefTRIAXone (ROCEPHIN)  IV 2 g (12/04/20 1259)     LOS: 4 days    Time spent: 37 minutes spent on chart review, discussion with nursing staff, consultants, updating family and interview/physical exam; more than 50% of that time was spent in counseling and/or coordination of care.    Alvira Philips [place], DO [facility] Available via Epic secure chat 7am-7pm After these hours, please refer to coverage provider listed on [email or web address] 12/04/2020, 1:23 PM

## 2020-12-04 NOTE — Progress Notes (Signed)
Patient had a small bowel movement at 1500. Stool had a small amount of dark red blood. Physician notified.

## 2020-12-05 DIAGNOSIS — I2510 Atherosclerotic heart disease of native coronary artery without angina pectoris: Secondary | ICD-10-CM | POA: Diagnosis not present

## 2020-12-05 DIAGNOSIS — K250 Acute gastric ulcer with hemorrhage: Secondary | ICD-10-CM | POA: Diagnosis not present

## 2020-12-05 DIAGNOSIS — I4891 Unspecified atrial fibrillation: Secondary | ICD-10-CM | POA: Diagnosis not present

## 2020-12-05 DIAGNOSIS — K922 Gastrointestinal hemorrhage, unspecified: Secondary | ICD-10-CM | POA: Diagnosis not present

## 2020-12-05 DIAGNOSIS — E119 Type 2 diabetes mellitus without complications: Secondary | ICD-10-CM | POA: Diagnosis not present

## 2020-12-05 LAB — CBC
HCT: 28.5 % — ABNORMAL LOW (ref 36.0–46.0)
Hemoglobin: 8.8 g/dL — ABNORMAL LOW (ref 12.0–15.0)
MCH: 31.5 pg (ref 26.0–34.0)
MCHC: 30.9 g/dL (ref 30.0–36.0)
MCV: 102.2 fL — ABNORMAL HIGH (ref 80.0–100.0)
Platelets: 123 10*3/uL — ABNORMAL LOW (ref 150–400)
RBC: 2.79 MIL/uL — ABNORMAL LOW (ref 3.87–5.11)
RDW: 16 % — ABNORMAL HIGH (ref 11.5–15.5)
WBC: 8.4 10*3/uL (ref 4.0–10.5)
nRBC: 0.2 % (ref 0.0–0.2)

## 2020-12-05 MED ORDER — CEPHALEXIN 500 MG PO CAPS
500.0000 mg | ORAL_CAPSULE | Freq: Four times a day (QID) | ORAL | Status: AC
  Administered 2020-12-05 – 2020-12-10 (×20): 500 mg via ORAL
  Filled 2020-12-05 (×20): qty 1

## 2020-12-05 NOTE — Progress Notes (Addendum)
Mobility Specialist - Progress Note   12/05/20 1600  Mobility  Activity Transferred:  Bed to chair  Level of Assistance Standby assist, set-up cues, supervision of patient - no hands on  Assistive Device Front wheel walker  Distance Ambulated (ft) 4 ft  Mobility Out of bed to chair with meals  Mobility Response Tolerated well  Mobility performed by Mobility specialist  $Mobility charge 1 Mobility    Pre-mobility:106 HR, 87% SpO2 During mobility: 127 HR, 96% SpO2 Post-mobility: 117 HR, 97% SpO2   Pt lying in bed upon arrival, utilizing RA. NT to check VS---O2 87%, 2L [place] reapplied increasing sats to 97%. Pt c/o attempting to sit EOB prior to entry, but states her LLE was giving her some trouble. Did not clarify further, but denied pain. Participated in LE therex without difficulty prior to OOB activity. Pt sat EOB with minA and extra time. Able to stand to RW with minA from elevated bed height. VC for hand placement. Supervision to ambulate to recliner. Mildly fatigued. AF HR ranging 127-145 bpm. No s/s of distress. Pt left in chair with alarm set, needs in reach. RN notified.    Filiberto Pinks Mobility Specialist 12/05/20, 4:28 PM

## 2020-12-05 NOTE — Progress Notes (Signed)
Melodie Bouillon, MD [address], [address], [place], [place], [address] [address], [place], [place], [address] Phone: [phone]  Fax: [phone]   Subjective: Patient sitting up in chair comfortably and enjoying her meal.  Denies any further episodes of melena   Objective: Exam: Vital signs in last 24 hours: Vitals:   12/05/20 0320 12/05/20 0756 12/05/20 1136 12/05/20 1533  BP: 104/79 124/86 115/76 128/83  Pulse: 97 (!) 109 (!) 109 (!) 109  Resp: (!) 21 17 16 17   Temp: (!) 97.4 F (36.3 C) 97.9 F (36.6 C) 98 F (36.7 C) 97.9 F (36.6 C)  TempSrc: Axillary     SpO2: 100% 97% 100% 97%  Weight:      Height:       Weight change:   Intake/Output Summary (Last 24 hours) at 12/05/2020 1730 Last data filed at 12/05/2020 1022 Gross per 24 hour  Intake 840 ml  Output 450 ml  Net 390 ml    Constitutional: General:   Alert,  Well-developed, well-nourished, pleasant and cooperative in NAD BP 128/83 (BP Location: Left Arm)   Pulse (!) 109   Temp 97.9 F (36.6 C)   Resp 17   Ht 5\' 3"  (1.6 m)   Wt 72.6 kg   LMP  (LMP Unknown)   SpO2 97%   BMI 28.34 kg/m   Eyes:  Sclera clear, no icterus.   Conjunctiva pink.   Ears:  No scars, lesions or masses, Normal auditory acuity. Nose:  No deformity, discharge, or lesions. Mouth:  No deformity or lesions, oropharynx pink & moist.  Neck:  Supple; no masses, trachea midline  Respiratory: Normal respiratory effort  Gastrointestinal:  Soft, non-tender and non-distended without masses, hepatosplenomegaly or hernias noted.  No guarding or rebound tenderness.     Psych:  Alert and cooperative. Normal mood and affect.  Musculoskeletal:  Head normocephalic, atraumatic. 5/5 Lower extremity strength bilaterally.  Skin: Warm. Intact without significant lesions or rashes. No jaundice.  Neurologic:  Face symmetrical, tongue midline, Normal sensation to touch  Psych:  Alert and oriented x3, Alert and  cooperative. Normal mood and affect.   Lab Results: Lab Results  Component Value Date   WBC 8.4 12/05/2020   HGB 8.8 (L) 12/05/2020   HCT 28.5 (L) 12/05/2020   MCV 102.2 (H) 12/05/2020   PLT 123 (L) 12/05/2020   Micro Results: Recent Results (from the past 240 hour(s))  Resp Panel by RT-PCR (Flu A&B, Covid) Nasopharyngeal Swab     Status: None   Collection Time: 11/30/20 10:53 PM   Specimen: Nasopharyngeal Swab; Nasopharyngeal(NP) swabs in vial transport medium  Result Value Ref Range Status   SARS Coronavirus 2 by RT PCR NEGATIVE NEGATIVE Final    Comment: (NOTE) SARS-CoV-2 target nucleic acids are NOT DETECTED.  The SARS-CoV-2 RNA is generally detectable in upper respiratory specimens during the acute phase of infection. The lowest concentration of SARS-CoV-2 viral copies this assay can detect is 138 copies/mL. A negative result does not preclude SARS-Cov-2 infection and should not be used as the sole basis for treatment or other patient management decisions. A negative result may occur with  improper specimen collection/handling, submission of specimen other than nasopharyngeal swab, presence of viral mutation(s) within the areas targeted by this assay, and inadequate number of viral copies(<138 copies/mL). A negative result must be combined with clinical observations, patient history, and epidemiological information. The expected result is Negative.  Fact Sheet for Patients:  12/07/2020  Fact Sheet for  Healthcare Providers:  SeriousBroker.it  This test is no t yet approved or cleared by the [place] and  has been authorized for detection and/or diagnosis of SARS-CoV-2 by FDA under an Emergency Use Authorization (EUA). This EUA will remain  in effect (meaning this test can be used) for the duration of the COVID-19 declaration under Section 564(b)(1) of the Act, 21 U.S.C.section 360bbb-3(b)(1), unless the  authorization is terminated  or revoked sooner.       Influenza A by PCR NEGATIVE NEGATIVE Final   Influenza B by PCR NEGATIVE NEGATIVE Final    Comment: (NOTE) The Xpert Xpress SARS-CoV-2/FLU/RSV plus assay is intended as an aid in the diagnosis of influenza from Nasopharyngeal swab specimens and should not be used as a sole basis for treatment. Nasal washings and aspirates are unacceptable for Xpert Xpress SARS-CoV-2/FLU/RSV testing.  Fact Sheet for Patients: BloggerCourse.com  Fact Sheet for Healthcare Providers: SeriousBroker.it  This test is not yet approved or cleared by the [place] FDA and has been authorized for detection and/or diagnosis of SARS-CoV-2 by FDA under an Emergency Use Authorization (EUA). This EUA will remain in effect (meaning this test can be used) for the duration of the COVID-19 declaration under Section 564(b)(1) of the Act, 21 U.S.C. section 360bbb-3(b)(1), unless the authorization is terminated or revoked.  Performed at [facility], [address] [place], [place] [address]    Studies/Results: No results found. Medications:  Scheduled Meds:  amiodarone  200 mg Oral BID   atorvastatin  10 mg Oral QHS   cephALEXin  500 mg Oral Q6H   levothyroxine  125 mcg Oral QAC breakfast   metoprolol tartrate  12.5 mg Oral BID   multivitamin with minerals  1 tablet Oral Daily   pantoprazole  40 mg Intravenous Q12H   sucralfate  1 g Oral TID WC & HS   Continuous Infusions: PRN Meds:.albuterol, ALPRAZolam, fluticasone, ondansetron **OR** ondansetron (ZOFRAN) IV   Assessment: Principal Problem:   GI bleed Active Problems:   Atrial fibrillation with RVR (HCC)   Essential hypertension   Hyperlipidemia   CAD (coronary artery disease)   Edema   Diabetes (HCC)   Hypokalemia    Plan: Patient is hemodynamically stable with no further decline in hemoglobin in over 24 hours and no  further episodes of melena  PPI IV twice daily  Continue serial CBCs and transfuse PRN Avoid NSAIDs Maintain 2 large-bore IV lines Please page GI with any acute hemodynamic changes, or signs of active GI bleeding  No indication for repeat upper endoscopy at this time.  However, if recurrent bleeding occurs, repeat upper endoscopy can be done at that time    LOS: 5 days   Melodie Bouillon, MD 12/05/2020, 5:30 PM

## 2020-12-05 NOTE — Progress Notes (Signed)
Mobility Specialist - Progress Note   12/05/20 1700  Mobility  Activity Transferred:  Chair to bed  Level of Assistance Standby assist, set-up cues, supervision of patient - no hands on  Assistive Device Front wheel walker  Distance Ambulated (ft) 4 ft  Mobility Sit up in bed/chair position for meals  Mobility Response Tolerated well  Mobility performed by Mobility specialist  $Mobility charge 1 Mobility    Pt transferred chair-bed. MinA and extra time to stand, supervision for ambulation, extra time to return supine. HR peaked at 130 bpm. O2 > 93% 2L. Noticed soiled chucks, NT in to assist with hygiene and gown change. Alarm set.     Filiberto Pinks Mobility Specialist 12/05/20, 5:36 PM

## 2020-12-05 NOTE — Progress Notes (Addendum)
PROGRESS NOTE    Jane Campbell  BSJ:[id number] DOB: 1926/10/10 DOA: 11/30/2020 PCP: Oneita Hurt, No    Brief Narrative:  Jane Campbell is a 87 year old female with past medical history significant for paroxysmal atrial fibrillation, CAD, type 2 diabetes mellitus, essential hypertension, chronic lower extremity edema, hypothyroidism, recently started on anticoagulation with Eliquis 11/09/2020 who presented to [facility] ED on 10/24 with complaints of crampy abdominal pain, rectal bleeding with melena/bright red blood.  Also reports weakness at home generalized fatigue and had to lower herself to the ground, no syncopal episode.  In the ED, afebrile, BP 120/90, HR 150, RR 22, SPO2 98% on room air.  WBC 12.9, hemoglobin 11.2 Hgb 12.3 one month ago), platelets 195, sodium 137, potassium 3.3, chloride 98, CO2 31, BUN 56, creatinine 1.05.  INR 1.5, glucose 168.  FOBT positive.  Gastroenterology consulted.  TRH consulted for further evaluation and management of GI bleed.  Assessment & Plan:   Principal Problem:   GI bleed Active Problems:   Atrial fibrillation with RVR (HCC)   Essential hypertension   Hyperlipidemia   CAD (coronary artery disease)   Edema   Diabetes (HCC)   Hypokalemia   Acute upper GI bleed 2/2 duodenal ulcer Patient presenting to ED with generalized weakness, fatigue, crampy abdominal pain and rectal bleeding.  Recently started on anticoagulation with Eliquis on 11/09/2020.  FOBT positive.  Hemoglobin 11.2 on admission.  Underwent EGD on 12/02/2020 with findings of nonbleeding duodenal ulcer with nonbleeding visible vessel treated with epinephrine injection and bipolar cautery. --Gastroenterology following, appreciate assistance --Hgb 11.2>11.5>9.4>8.4>9.2>>8.2>8.6>8.8 --Eliquis discontinued --Protonix 40mg  IV q12h --Carafate 1 g p.o. TIDAC/HS --soft diet --Repeat CBC in the a.m.  Paroxysmal atrial fibrillation with RVR Initially started on a diltiazem drip which was discontinued due to  hypotension. --Cardiology following, appreciate assistance --Anticoagulation discontinued due to GI bleed as above --Amiodarone 200 mg BID --Continue monitor on telemetry --Outpatient follow-up with Dr. 1 week after discharge  Hypokalemia Repleted, potassium 4.1 this morning. --Repeat electrolytes in a.m.  Left lower extremity cellulitis --Ceftriaxone transition to Keflex 500 mg p.o. q6h 10/29  Hypomagnesemia: Resolved Magnesium 2.1 this morning  Essential hypertension On metoprolol succinate 50 mg p.o. twice daily at home. --BP 104/79 this morning --Holding home metoprolol, on amiodarone 200 mg twice daily --Continue monitor BP closely in the setting of GI bleed  Hyperlipidemia: Atorvastatin 10 mg p.o. daily  Type 2 diabetes mellitus Diet controlled at home.  Hemoglobin A1c 5.7, well controlled. --SSI for coverage --CBGs qAC/HS  Hypothyroidism TSH 1.329, free T4 1.73, T3 51. --Levothyroxine 125 mcg p.o. daily  Anxiety: Alprazolam 0.25 mg p.o. 3 times daily as needed anxiety  Weakness/deconditioning/debility: --PT/OT recommending SNF; TOC for placement   DVT prophylaxis: SCDs Start: 12/01/20 0402   Code Status: DNR Family Communication: No family present at bedside this morning.  Disposition Plan:  Level of care: Progressive Cardiac Status is: Inpatient  Remains inpatient appropriate because: Pending SNF placement    Consultants:  Cardiology - signed off 10/27 Gastroenterology  Procedures:  EGD 10/26  Antimicrobials:  Ceftriaxone 10/25 - 10/29 Keflex 10/29>>   Subjective: Patient seen examined at bedside, resting comfortably.  Nursing reports bowel movement yesterday with some dark blood.  Patient with no complaints this morning.  Will advance diet to soft today.  Hemoglobin stable, 8.8 this morning.  No other specific complaints or concerns at this time.  Denies headache, no dizziness, no chest pain, palpitation, no shortness of breath, no  abdominal pain.  No acute  events overnight per nursing staff.  Pending SNF placement.  Objective: Vitals:   12/04/20 2335 12/05/20 0320 12/05/20 0756 12/05/20 1136  BP: 108/71 104/79 124/86 115/76  Pulse:  97 (!) 109 (!) 109  Resp: 16 (!) 21 17 16   Temp: (!) 97.4 F (36.3 C) (!) 97.4 F (36.3 C) 97.9 F (36.6 C) 98 F (36.7 C)  TempSrc: Axillary Axillary    SpO2: 100% 100% 97% 100%  Weight:      Height:        Intake/Output Summary (Last 24 hours) at 12/05/2020 1216 Last data filed at 12/05/2020 1022 Gross per 24 hour  Intake 1420 ml  Output 450 ml  Net 970 ml   Filed Weights   12/02/20 1247  Weight: 72.6 kg    Examination:  General exam: Appears calm and comfortable, elderly in appearance Respiratory system: Clear to auscultation. Respiratory effort normal.  On 2 L nasal cannula with SPO2 98% at rest Cardiovascular system: S1 & S2 heard, RRR. No JVD, murmurs, rubs, gallops or clicks. No pedal edema. Gastrointestinal system: Abdomen is nondistended, soft and nontender. No organomegaly or masses felt. Normal bowel sounds heard. Central nervous system: Alert and oriented. No focal neurological deficits. Extremities: Symmetric 5 x 5 power. Skin: No rashes, lesions or ulcers Psychiatry: Judgement and insight appear normal. Mood & affect appropriate.     Data Reviewed: I have personally reviewed following labs and imaging studies  CBC: Recent Labs  Lab 11/30/20 2108 12/01/20 0019 12/01/20 1804 12/02/20 0755 12/03/20 0555 12/03/20 1158 12/04/20 0035 12/04/20 0826 12/05/20 0615  WBC 12.9*   < > 12.8* 11.5* 10.9*  --   --  9.0 8.4  NEUTROABS 10.4*  --   --  9.4*  --   --   --   --   --   HGB 11.2*   < > 9.0* 9.4* 8.4* 9.2* 8.2* 8.6* 8.8*  HCT 33.6*   < > 28.5* 29.6* 27.3* 29.6* 25.8* 27.0* 28.5*  MCV 99.7   < > 104.0* 101.4* 104.6*  --   --  101.9* 102.2*  PLT 195   < > 152 147* 133*  --   --  121* 123*   < > = values in this interval not displayed.   Basic  Metabolic Panel: Recent Labs  Lab 11/30/20 2108 12/01/20 0648 12/02/20 0755 12/03/20 0555 12/04/20 0826  NA 137 139 138 139 137  K 3.3* 2.8* 4.5 4.1 4.0  CL 98 101 104 110 106  CO2 31 29 25 25 26   GLUCOSE 168* 123* 147* 130* 107*  BUN 56* 56* 49* 42* 37*  CREATININE 1.05* 0.91 1.09* 1.04* 1.09*  CALCIUM 8.5* 7.7* 8.4* 8.6* 8.4*  MG  --  1.6* 1.6* 1.7 2.1  PHOS  --  2.6 2.5  --   --    GFR: Estimated Creatinine Clearance: 32.1 mL/min (A) (by C-G formula based on SCr of 1.09 mg/dL (H)). Liver Function Tests: Recent Labs  Lab 11/30/20 2108 12/01/20 0648 12/02/20 0755  AST 21 18 21   ALT 48* 35 34  ALKPHOS 59 46 52  BILITOT 1.4* 1.2 0.9  PROT 5.2* 4.2* 5.0*  ALBUMIN 3.1* 2.6* 3.1*   Recent Labs  Lab 11/30/20 2108  LIPASE 40   No results for input(s): AMMONIA in the last 168 hours. Coagulation Profile: Recent Labs  Lab 11/30/20 2108  INR 1.5*   Cardiac Enzymes: No results for input(s): CKTOTAL, CKMB, CKMBINDEX, TROPONINI in the last 168 hours. BNP (  last 3 results) No results for input(s): PROBNP in the last 8760 hours. HbA1C: Recent Labs    12/03/20 0555  HGBA1C 5.7*   CBG: No results for input(s): GLUCAP in the last 168 hours. Lipid Profile: No results for input(s): CHOL, HDL, LDLCALC, TRIG, CHOLHDL, LDLDIRECT in the last 72 hours. Thyroid Function Tests: No results for input(s): TSH, T4TOTAL, FREET4, T3FREE, THYROIDAB in the last 72 hours.  Anemia Panel: No results for input(s): VITAMINB12, FOLATE, FERRITIN, TIBC, IRON, RETICCTPCT in the last 72 hours. Sepsis Labs: No results for input(s): PROCALCITON, LATICACIDVEN in the last 168 hours.  Recent Results (from the past 240 hour(s))  Resp Panel by RT-PCR (Flu A&B, Covid) Nasopharyngeal Swab     Status: None   Collection Time: 11/30/20 10:53 PM   Specimen: Nasopharyngeal Swab; Nasopharyngeal(NP) swabs in vial transport medium  Result Value Ref Range Status   SARS Coronavirus 2 by RT PCR NEGATIVE  NEGATIVE Final    Comment: (NOTE) SARS-CoV-2 target nucleic acids are NOT DETECTED.  The SARS-CoV-2 RNA is generally detectable in upper respiratory specimens during the acute phase of infection. The lowest concentration of SARS-CoV-2 viral copies this assay can detect is 138 copies/mL. A negative result does not preclude SARS-Cov-2 infection and should not be used as the sole basis for treatment or other patient management decisions. A negative result may occur with  improper specimen collection/handling, submission of specimen other than nasopharyngeal swab, presence of viral mutation(s) within the areas targeted by this assay, and inadequate number of viral copies(<138 copies/mL). A negative result must be combined with clinical observations, patient history, and epidemiological information. The expected result is Negative.  Fact Sheet for Patients:  BloggerCourse.com  Fact Sheet for Healthcare Providers:  SeriousBroker.it  This test is no t yet approved or cleared by the [place] FDA and  has been authorized for detection and/or diagnosis of SARS-CoV-2 by FDA under an Emergency Use Authorization (EUA). This EUA will remain  in effect (meaning this test can be used) for the duration of the COVID-19 declaration under Section 564(b)(1) of the Act, 21 U.S.C.section 360bbb-3(b)(1), unless the authorization is terminated  or revoked sooner.       Influenza A by PCR NEGATIVE NEGATIVE Final   Influenza B by PCR NEGATIVE NEGATIVE Final    Comment: (NOTE) The Xpert Xpress SARS-CoV-2/FLU/RSV plus assay is intended as an aid in the diagnosis of influenza from Nasopharyngeal swab specimens and should not be used as a sole basis for treatment. Nasal washings and aspirates are unacceptable for Xpert Xpress SARS-CoV-2/FLU/RSV testing.  Fact Sheet for Patients: BloggerCourse.com  Fact Sheet for Healthcare  Providers: SeriousBroker.it  This test is not yet approved or cleared by the [place] FDA and has been authorized for detection and/or diagnosis of SARS-CoV-2 by FDA under an Emergency Use Authorization (EUA). This EUA will remain in effect (meaning this test can be used) for the duration of the COVID-19 declaration under Section 564(b)(1) of the Act, 21 U.S.C. section 360bbb-3(b)(1), unless the authorization is terminated or revoked.  Performed at [facility], [address], [place], [place] [address]          Radiology Studies: No results found.      Scheduled Meds:  amiodarone  200 mg Oral BID   atorvastatin  10 mg Oral QHS   cephALEXin  500 mg Oral Q6H   levothyroxine  125 mcg Oral QAC breakfast   metoprolol tartrate  12.5 mg Oral BID   multivitamin with  minerals  1 tablet Oral Daily   pantoprazole  40 mg Intravenous Q12H   sucralfate  1 g Oral TID WC & HS   Continuous Infusions:     LOS: 5 days    Time spent: 37 minutes spent on chart review, discussion with nursing staff, consultants, updating family and interview/physical exam; more than 50% of that time was spent in counseling and/or coordination of care.    Alvira Philips [place], DO [facility] Available via Epic secure chat 7am-7pm After these hours, please refer to coverage provider listed on [email or web address] 12/05/2020, 12:16 PM

## 2020-12-06 ENCOUNTER — Inpatient Hospital Stay: Payer: Medicare Other

## 2020-12-06 DIAGNOSIS — E119 Type 2 diabetes mellitus without complications: Secondary | ICD-10-CM | POA: Diagnosis not present

## 2020-12-06 DIAGNOSIS — K922 Gastrointestinal hemorrhage, unspecified: Secondary | ICD-10-CM | POA: Diagnosis not present

## 2020-12-06 DIAGNOSIS — I4891 Unspecified atrial fibrillation: Secondary | ICD-10-CM | POA: Diagnosis not present

## 2020-12-06 DIAGNOSIS — I2510 Atherosclerotic heart disease of native coronary artery without angina pectoris: Secondary | ICD-10-CM | POA: Diagnosis not present

## 2020-12-06 LAB — BASIC METABOLIC PANEL
Anion gap: 5 (ref 5–15)
BUN: 27 mg/dL — ABNORMAL HIGH (ref 8–23)
CO2: 28 mmol/L (ref 22–32)
Calcium: 8.4 mg/dL — ABNORMAL LOW (ref 8.9–10.3)
Chloride: 106 mmol/L (ref 98–111)
Creatinine, Ser: 1.1 mg/dL — ABNORMAL HIGH (ref 0.44–1.00)
GFR, Estimated: 47 mL/min — ABNORMAL LOW (ref >60)
Glucose, Bld: 140 mg/dL — ABNORMAL HIGH (ref 70–99)
Potassium: 4 mmol/L (ref 3.5–5.1)
Sodium: 139 mmol/L (ref 135–145)

## 2020-12-06 LAB — BRAIN NATRIURETIC PEPTIDE: B Natriuretic Peptide: 302.1 pg/mL — ABNORMAL HIGH (ref 0.0–100.0)

## 2020-12-06 MED ORDER — FUROSEMIDE 10 MG/ML IJ SOLN
40.0000 mg | Freq: Once | INTRAMUSCULAR | Status: AC
  Administered 2020-12-06: 40 mg via INTRAVENOUS
  Filled 2020-12-06: qty 4

## 2020-12-06 MED ORDER — DILTIAZEM HCL 25 MG/5ML IV SOLN
18.0000 mg | Freq: Once | INTRAVENOUS | Status: AC
  Administered 2020-12-06: 18 mg via INTRAVENOUS
  Filled 2020-12-06: qty 5

## 2020-12-06 MED ORDER — METOPROLOL TARTRATE 50 MG PO TABS
50.0000 mg | ORAL_TABLET | Freq: Two times a day (BID) | ORAL | Status: DC
  Administered 2020-12-06: 50 mg via ORAL
  Filled 2020-12-06: qty 1

## 2020-12-06 MED ORDER — METOPROLOL TARTRATE 5 MG/5ML IV SOLN
5.0000 mg | Freq: Once | INTRAVENOUS | Status: AC
  Administered 2020-12-06: 5 mg via INTRAVENOUS
  Filled 2020-12-06: qty 5

## 2020-12-06 MED ORDER — PANTOPRAZOLE SODIUM 40 MG PO TBEC
40.0000 mg | DELAYED_RELEASE_TABLET | Freq: Two times a day (BID) | ORAL | Status: DC
  Administered 2020-12-06 – 2020-12-21 (×31): 40 mg via ORAL
  Filled 2020-12-06 (×32): qty 1

## 2020-12-06 MED ORDER — DILTIAZEM HCL 25 MG/5ML IV SOLN
10.0000 mg | Freq: Once | INTRAVENOUS | Status: AC
  Administered 2020-12-06: 10 mg via INTRAVENOUS
  Filled 2020-12-06: qty 5

## 2020-12-06 MED ORDER — METOPROLOL TARTRATE 25 MG PO TABS: 25.0000 mg | ORAL_TABLET | Freq: Two times a day (BID) | ORAL | Status: DC

## 2020-12-06 NOTE — Progress Notes (Signed)
Patient was in recliner went to assisted pt back in to bed noticed pts WOB has increased and edema in  bilat extremities, check pt vital signs HR found to be in 120-150 MD messaged, responded and new orders placed. PT placed back on tele, and o2 applied

## 2020-12-06 NOTE — Progress Notes (Signed)
PROGRESS NOTE    Jane Campbell  ZOX:[id number] DOB: [birth year] DOA: 11/30/2020 PCP: Oneita Hurt, No    Brief Narrative:  Jane Campbell is a 87 year old female with past medical history significant for paroxysmal atrial fibrillation, CAD, type 2 diabetes mellitus, essential hypertension, chronic lower extremity edema, hypothyroidism, recently started on anticoagulation with Eliquis 11/09/2020 who presented to [facility] ED on 10/24 with complaints of crampy abdominal pain, rectal bleeding with melena/bright red blood.  Also reports weakness at home generalized fatigue and had to lower herself to the ground, no syncopal episode.  In the ED, afebrile, BP 120/90, HR 150, RR 22, SPO2 98% on room air.  WBC 12.9, hemoglobin 11.2 Hgb 12.3 one month ago), platelets 195, sodium 137, potassium 3.3, chloride 98, CO2 31, BUN 56, creatinine 1.05.  INR 1.5, glucose 168.  FOBT positive.  Gastroenterology consulted.  TRH consulted for further evaluation and management of GI bleed.  Assessment & Plan:   Principal Problem:   GI bleed Active Problems:   Atrial fibrillation with RVR (HCC)   Essential hypertension   Hyperlipidemia   CAD (coronary artery disease)   Edema   Diabetes (HCC)   Hypokalemia   Acute upper GI bleed 2/2 duodenal ulcer Patient presenting to ED with generalized weakness, fatigue, crampy abdominal pain and rectal bleeding.  Recently started on anticoagulation with Eliquis on 11/09/2020.  FOBT positive.  Hemoglobin 11.2 on admission.  Underwent EGD on 12/02/2020 with findings of nonbleeding duodenal ulcer with nonbleeding visible vessel treated with epinephrine injection and bipolar cautery. --Gastroenterology following, appreciate assistance --Hgb 11.2>11.5>9.4>8.4>9.2>>8.2>8.6>8.8 --Eliquis discontinued --Protonix 40mg  IV PO q12h --Carafate 1 g p.o. TIDAC/HS --soft diet --Repeat CBC in the a.m.  Paroxysmal atrial fibrillation with RVR Initially started on a diltiazem drip which was discontinued due  to hypotension.  Cardiology was initially consulted and followed during hospital course, now signed off. --Anticoagulation discontinued due to GI bleed as above --Amiodarone 200 mg BID --Continue monitor on telemetry --Outpatient follow-up with Dr. 1 week after discharge  Hypokalemia: Resolved --Repleted --BMP/magnesium in a.m.  Left lower extremity cellulitis --Ceftriaxone transitioned to Keflex 500 mg p.o. q6h 10/29  Hypomagnesemia: Resolved Repleted  Essential hypertension On metoprolol succinate 50 mg p.o. twice daily at home. --BP 133/73 this morning --Increase Metoprolol to 25mg  BID, amiodarone 200 mg twice daily --Continue monitor BP closely in the setting of GI bleed  Hyperlipidemia: Atorvastatin 10 mg p.o. daily  Type 2 diabetes mellitus Diet controlled at home.  Hemoglobin A1c 5.7, well controlled. --SSI for coverage --CBGs qAC/HS  Hypothyroidism TSH 1.329, free T4 1.73, T3 51. --Levothyroxine 125 mcg p.o. daily  Anxiety: Alprazolam 0.25 mg p.o. 3 times daily as needed anxiety  Weakness/deconditioning/debility: --PT/OT recommending SNF; TOC for placement   DVT prophylaxis: SCDs Start: 12/01/20 0402   Code Status: DNR Family Communication: No family present at bedside this morning.  Disposition Plan:  Level of care: Med-Surg Status is: Inpatient  Remains inpatient appropriate because: Pending SNF placement    Consultants:  Cardiology - signed off 10/27 Gastroenterology  Procedures:  EGD 10/26  Antimicrobials:  Ceftriaxone 10/25 - 10/29 Keflex 10/29>>   Subjective: Patient seen examined at bedside, resting comfortably.  Nursing reports bowel movement yesterday with some dark blood.  Patient with no complaints this morning other than continued weakness.  RN at bedside reports intermittent cough overnight.  No other specific complaints or concerns at this time.  Denies headache, no dizziness, no chest pain, palpitation, no shortness of  breath, no abdominal pain.  No acute events overnight per nursing staff.  Pending SNF placement.  Objective: Vitals:   12/05/20 2053 12/05/20 2200 12/06/20 0000 12/06/20 0803  BP: 119/75  133/73 109/72  Pulse: (!) 129 93  66  Resp: 18  15 17   Temp: (!) 97.5 F (36.4 C)  97.7 F (36.5 C) 97.9 F (36.6 C)  TempSrc:   Axillary   SpO2: 100% 100% 100% 90%  Weight:      Height:        Intake/Output Summary (Last 24 hours) at 12/06/2020 1134 Last data filed at 12/06/2020 1015 Gross per 24 hour  Intake 240 ml  Output 300 ml  Net -60 ml   Filed Weights   12/02/20 1247  Weight: 72.6 kg    Examination:  General exam: Appears calm and comfortable, elderly in appearance Respiratory system: Clear to auscultation. Respiratory effort normal.  On 2 L nasal cannula with SPO2 98% at rest Cardiovascular system: S1 & S2 heard, RRR. No JVD, murmurs, rubs, gallops or clicks. No pedal edema. Gastrointestinal system: Abdomen is nondistended, soft and nontender. No organomegaly or masses felt. Normal bowel sounds heard. Central nervous system: Alert and oriented. No focal neurological deficits. Extremities: Symmetric 5 x 5 power. Skin: No rashes, lesions or ulcers Psychiatry: Judgement and insight appear normal. Mood & affect appropriate.     Data Reviewed: I have personally reviewed following labs and imaging studies  CBC: Recent Labs  Lab 11/30/20 2108 12/01/20 0019 12/01/20 1804 12/02/20 0755 12/03/20 0555 12/03/20 1158 12/04/20 0035 12/04/20 0826 12/05/20 0615  WBC 12.9*   < > 12.8* 11.5* 10.9*  --   --  9.0 8.4  NEUTROABS 10.4*  --   --  9.4*  --   --   --   --   --   HGB 11.2*   < > 9.0* 9.4* 8.4* 9.2* 8.2* 8.6* 8.8*  HCT 33.6*   < > 28.5* 29.6* 27.3* 29.6* 25.8* 27.0* 28.5*  MCV 99.7   < > 104.0* 101.4* 104.6*  --   --  101.9* 102.2*  PLT 195   < > 152 147* 133*  --   --  121* 123*   < > = values in this interval not displayed.   Basic Metabolic Panel: Recent Labs  Lab  11/30/20 2108 12/01/20 0648 12/02/20 0755 12/03/20 0555 12/04/20 0826  NA 137 139 138 139 137  K 3.3* 2.8* 4.5 4.1 4.0  CL 98 101 104 110 106  CO2 31 29 25 25 26   GLUCOSE 168* 123* 147* 130* 107*  BUN 56* 56* 49* 42* 37*  CREATININE 1.05* 0.91 1.09* 1.04* 1.09*  CALCIUM 8.5* 7.7* 8.4* 8.6* 8.4*  MG  --  1.6* 1.6* 1.7 2.1  PHOS  --  2.6 2.5  --   --    GFR: Estimated Creatinine Clearance: 32.1 mL/min (A) (by C-G formula based on SCr of 1.09 mg/dL (H)). Liver Function Tests: Recent Labs  Lab 11/30/20 2108 12/01/20 0648 12/02/20 0755  AST 21 18 21   ALT 48* 35 34  ALKPHOS 59 46 52  BILITOT 1.4* 1.2 0.9  PROT 5.2* 4.2* 5.0*  ALBUMIN 3.1* 2.6* 3.1*   Recent Labs  Lab 11/30/20 2108  LIPASE 40   No results for input(s): AMMONIA in the last 168 hours. Coagulation Profile: Recent Labs  Lab 11/30/20 2108  INR 1.5*   Cardiac Enzymes: No results for input(s): CKTOTAL, CKMB, CKMBINDEX, TROPONINI in the last 168 hours. BNP (last 3 results) No  results for input(s): PROBNP in the last 8760 hours. HbA1C: No results for input(s): HGBA1C in the last 72 hours.  CBG: No results for input(s): GLUCAP in the last 168 hours. Lipid Profile: No results for input(s): CHOL, HDL, LDLCALC, TRIG, CHOLHDL, LDLDIRECT in the last 72 hours. Thyroid Function Tests: No results for input(s): TSH, T4TOTAL, FREET4, T3FREE, THYROIDAB in the last 72 hours.  Anemia Panel: No results for input(s): VITAMINB12, FOLATE, FERRITIN, TIBC, IRON, RETICCTPCT in the last 72 hours. Sepsis Labs: No results for input(s): PROCALCITON, LATICACIDVEN in the last 168 hours.  Recent Results (from the past 240 hour(s))  Resp Panel by RT-PCR (Flu A&B, Covid) Nasopharyngeal Swab     Status: None   Collection Time: 11/30/20 10:53 PM   Specimen: Nasopharyngeal Swab; Nasopharyngeal(NP) swabs in vial transport medium  Result Value Ref Range Status   SARS Coronavirus 2 by RT PCR NEGATIVE NEGATIVE Final    Comment:  (NOTE) SARS-CoV-2 target nucleic acids are NOT DETECTED.  The SARS-CoV-2 RNA is generally detectable in upper respiratory specimens during the acute phase of infection. The lowest concentration of SARS-CoV-2 viral copies this assay can detect is 138 copies/mL. A negative result does not preclude SARS-Cov-2 infection and should not be used as the sole basis for treatment or other patient management decisions. A negative result may occur with  improper specimen collection/handling, submission of specimen other than nasopharyngeal swab, presence of viral mutation(s) within the areas targeted by this assay, and inadequate number of viral copies(<138 copies/mL). A negative result must be combined with clinical observations, patient history, and epidemiological information. The expected result is Negative.  Fact Sheet for Patients:  BloggerCourse.com  Fact Sheet for Healthcare Providers:  SeriousBroker.it  This test is no t yet approved or cleared by the [place] FDA and  has been authorized for detection and/or diagnosis of SARS-CoV-2 by FDA under an Emergency Use Authorization (EUA). This EUA will remain  in effect (meaning this test can be used) for the duration of the COVID-19 declaration under Section 564(b)(1) of the Act, 21 U.S.C.section 360bbb-3(b)(1), unless the authorization is terminated  or revoked sooner.       Influenza A by PCR NEGATIVE NEGATIVE Final   Influenza B by PCR NEGATIVE NEGATIVE Final    Comment: (NOTE) The Xpert Xpress SARS-CoV-2/FLU/RSV plus assay is intended as an aid in the diagnosis of influenza from Nasopharyngeal swab specimens and should not be used as a sole basis for treatment. Nasal washings and aspirates are unacceptable for Xpert Xpress SARS-CoV-2/FLU/RSV testing.  Fact Sheet for Patients: BloggerCourse.com  Fact Sheet for Healthcare  Providers: SeriousBroker.it  This test is not yet approved or cleared by the [place] FDA and has been authorized for detection and/or diagnosis of SARS-CoV-2 by FDA under an Emergency Use Authorization (EUA). This EUA will remain in effect (meaning this test can be used) for the duration of the COVID-19 declaration under Section 564(b)(1) of the Act, 21 U.S.C. section 360bbb-3(b)(1), unless the authorization is terminated or revoked.  Performed at [facility], [address] [place], [place] [address]          Radiology Studies: No results found.      Scheduled Meds:  amiodarone  200 mg Oral BID   atorvastatin  10 mg Oral QHS   cephALEXin  500 mg Oral Q6H   levothyroxine  125 mcg Oral QAC breakfast   metoprolol tartrate  25 mg Oral BID   multivitamin with minerals  1 tablet Oral  Daily   pantoprazole  40 mg Oral BID   sucralfate  1 g Oral TID WC & HS   Continuous Infusions:     LOS: 6 days    Time spent: 35 minutes spent on chart review, discussion with nursing staff, consultants, updating family and interview/physical exam; more than 50% of that time was spent in counseling and/or coordination of care.    Alvira Philips [place], DO [facility] Available via Epic secure chat 7am-7pm After these hours, please refer to coverage provider listed on [email or web address] 12/06/2020, 11:34 AM

## 2020-12-06 NOTE — TOC Progression Note (Signed)
Transition of Care [facility]) - Progression Note    Patient Details  Name: Jane Campbell MRN: [id number] Date of Birth: September 16, 1926  Transition of Care [facility]) CM/SW Contact  Liliana Cline, LCSW Phone Number: 12/06/2020, 12:08 PM  Clinical Narrative:    Reached out to Admissions at [facility], [facility], [facility], [facility], and [facility] and asked them to review pending bed request.         Expected Discharge Plan and Services                                                 Social Determinants of Health (SDOH) Interventions    Readmission Risk Interventions No flowsheet data found.

## 2020-12-07 DIAGNOSIS — I4891 Unspecified atrial fibrillation: Secondary | ICD-10-CM | POA: Diagnosis not present

## 2020-12-07 DIAGNOSIS — I2510 Atherosclerotic heart disease of native coronary artery without angina pectoris: Secondary | ICD-10-CM | POA: Diagnosis not present

## 2020-12-07 DIAGNOSIS — K264 Chronic or unspecified duodenal ulcer with hemorrhage: Secondary | ICD-10-CM | POA: Diagnosis not present

## 2020-12-07 DIAGNOSIS — E119 Type 2 diabetes mellitus without complications: Secondary | ICD-10-CM | POA: Diagnosis not present

## 2020-12-07 LAB — BASIC METABOLIC PANEL
Anion gap: 6 (ref 5–15)
BUN: 22 mg/dL (ref 8–23)
CO2: 26 mmol/L (ref 22–32)
Calcium: 8.2 mg/dL — ABNORMAL LOW (ref 8.9–10.3)
Chloride: 104 mmol/L (ref 98–111)
Creatinine, Ser: 1.11 mg/dL — ABNORMAL HIGH (ref 0.44–1.00)
GFR, Estimated: 47 mL/min — ABNORMAL LOW (ref >60)
Glucose, Bld: 112 mg/dL — ABNORMAL HIGH (ref 70–99)
Potassium: 3.4 mmol/L — ABNORMAL LOW (ref 3.5–5.1)
Sodium: 136 mmol/L (ref 135–145)

## 2020-12-07 LAB — CBC
HCT: 29.4 % — ABNORMAL LOW (ref 36.0–46.0)
Hemoglobin: 9.4 g/dL — ABNORMAL LOW (ref 12.0–15.0)
MCH: 33.1 pg (ref 26.0–34.0)
MCHC: 32 g/dL (ref 30.0–36.0)
MCV: 103.5 fL — ABNORMAL HIGH (ref 80.0–100.0)
Platelets: 121 10*3/uL — ABNORMAL LOW (ref 150–400)
RBC: 2.84 MIL/uL — ABNORMAL LOW (ref 3.87–5.11)
RDW: 15.9 % — ABNORMAL HIGH (ref 11.5–15.5)
WBC: 9.1 10*3/uL (ref 4.0–10.5)
nRBC: 0 % (ref 0.0–0.2)

## 2020-12-07 MED ORDER — POTASSIUM CHLORIDE CRYS ER 10 MEQ PO TBCR
30.0000 meq | EXTENDED_RELEASE_TABLET | ORAL | Status: AC
  Administered 2020-12-07 (×2): 30 meq via ORAL
  Filled 2020-12-07 (×2): qty 3

## 2020-12-07 MED ORDER — METOPROLOL TARTRATE 50 MG PO TABS: 75.0000 mg | ORAL_TABLET | Freq: Two times a day (BID) | ORAL | Status: DC

## 2020-12-07 MED ORDER — FUROSEMIDE 10 MG/ML IJ SOLN
40.0000 mg | Freq: Two times a day (BID) | INTRAMUSCULAR | Status: DC
  Administered 2020-12-07 (×2): 40 mg via INTRAVENOUS
  Filled 2020-12-07 (×2): qty 4

## 2020-12-07 MED ORDER — METOPROLOL TARTRATE 25 MG PO TABS
75.0000 mg | ORAL_TABLET | Freq: Two times a day (BID) | ORAL | Status: DC
  Administered 2020-12-07 – 2020-12-09 (×6): 75 mg via ORAL
  Filled 2020-12-07 (×7): qty 3

## 2020-12-07 NOTE — Progress Notes (Signed)
Wyline Mood , MD [address], [address], [place], [place], [address] [address], [place], [place], [address] Phone: [phone]  Fax: [phone]   Jane Campbell is being followed for GI bleed  Subjective: No further black stool, no abdominal pain , feels exhausted and short of breath at times    Objective: Vital signs in last 24 hours: Vitals:   12/06/20 2300 12/07/20 0000 12/07/20 0400 12/07/20 0830  BP:  104/73 104/79 (!) 118/99  Pulse:  96  (!) 108  Resp: 19 18 18 18   Temp:   (!) 97.5 F (36.4 C) 97.8 F (36.6 C)  TempSrc:   Axillary Oral  SpO2:  99% 94% 98%  Weight:      Height:       Weight change:   Intake/Output Summary (Last 24 hours) at 12/07/2020 1137 Last data filed at 12/07/2020 12/09/2020 Gross per 24 hour  Intake --  Output 2250 ml  Net -2250 ml     Exam:  Abdomen: soft, nontender, normal bowel sounds   Lab Results: @LABTEST2 @ Micro Results: Recent Results (from the past 240 hour(s))  Resp Panel by RT-PCR (Flu A&B, Covid) Nasopharyngeal Swab     Status: None   Collection Time: 11/30/20 10:53 PM   Specimen: Nasopharyngeal Swab; Nasopharyngeal(NP) swabs in vial transport medium  Result Value Ref Range Status   SARS Coronavirus 2 by RT PCR NEGATIVE NEGATIVE Final    Comment: (NOTE) SARS-CoV-2 target nucleic acids are NOT DETECTED.  The SARS-CoV-2 RNA is generally detectable in upper respiratory specimens during the acute phase of infection. The lowest concentration of SARS-CoV-2 viral copies this assay can detect is 138 copies/mL. A negative result does not preclude SARS-Cov-2 infection and should not be used as the sole basis for treatment or other patient management decisions. A negative result may occur with  improper specimen collection/handling, submission of specimen other than nasopharyngeal swab, presence of viral mutation(s) within the areas targeted by this assay, and inadequate number of viral copies(<138 copies/mL). A  negative result must be combined with clinical observations, patient history, and epidemiological information. The expected result is Negative.  Fact Sheet for Patients:   Fact Sheet for Healthcare Providers:  12/02/20  This test is no t yet approved or cleared by the [employer]ger[employer] FDA and  has been authorized for detection and/or diagnosis of SARS-CoV-2 by FDA under an Emergency Use Authorization (EUA). This EUA will remain  in effect (meaning this test can be used) for the duration of the COVID-19 declaration under Section 564(b)(1) of the Act, 21 U.S.C.section 360bbb-3(b)(1), unless the authorization is terminated  or revoked sooner.       Influenza A by PCR NEGATIVE NEGATIVE Final   Influenza B by PCR NEGATIVE NEGATIVE Final    Comment: (NOTE) The Xpert Xpress SARS-CoV-2/FLU/RSV plus assay is intended as an aid in the diagnosis of influenza from Nasopharyngeal swab specimens and should not be used as a sole basis for treatment. Nasal washings and aspirates are unacceptable for Xpert Xpress SARS-CoV-2/FLU/RSV testing.  Fact Sheet for Patients: SeriousBroker.it  Fact Sheet for Healthcare Providers: [place]  This test is not yet approved or cleared by the [employer] FDA and has been authorized for detection and/or diagnosis of SARS-CoV-2 by FDA under an Emergency Use Authorization (EUA). This EUA will remain in effect (meaning this test can be used) for the duration of the COVID-19 declaration under Section 564(b)(1) of the Act, 21 U.S.C. section 360bbb-3(b)(1), unless the authorization  is terminated or revoked.  Performed at [facility], [address], [place], [place] [address]    Studies/Results: [facility] Chest [place] 1 View  Result Date: 12/06/2020 CLINICAL DATA:  Shortness of breath. EXAM: PORTABLE CHEST 1 VIEW  COMPARISON:  November 03, 2020. FINDINGS: Stable cardiomediastinal silhouette. Bibasilar opacities are noted concerning for edema or atelectasis with associated pleural effusions, right greater than left. Bony thorax is unremarkable. IMPRESSION: Bibasilar opacities concerning for edema or atelectasis with associated pleural effusions, right greater than left. Electronically Signed   By: Lupita Raider M.D.   On: 12/06/2020 16:43   Medications: I have reviewed the patient's current medications. Scheduled Meds:  amiodarone  200 mg Oral BID   atorvastatin  10 mg Oral QHS   cephALEXin  500 mg Oral Q6H   furosemide  40 mg Intravenous BID   levothyroxine  125 mcg Oral QAC breakfast   metoprolol tartrate  75 mg Oral BID   multivitamin with minerals  1 tablet Oral Daily   pantoprazole  40 mg Oral BID   potassium chloride  30 mEq Oral Q3H   sucralfate  1 g Oral TID WC & HS   Continuous Infusions: PRN Meds:.albuterol, ALPRAZolam, fluticasone, ondansetron **OR** ondansetron (ZOFRAN) IV   Assessment: Principal Problem:   GI bleed Active Problems:   Atrial fibrillation with RVR (HCC)   Essential hypertension   Hyperlipidemia   CAD (coronary artery disease)   Edema   Diabetes (HCC)   Hypokalemia   Jane Campbell 87 y.o. female admitted with melena and found to have a large ulcer in the duodenum that I injected and burnt with heat. No further bleeding. Hb stable.   Plan: Continue protonix 40 mg BID for 8 weeks then decrease to once a day . OP GI follow up . Will need H pylori testing in the outpatient   I will sign off.  Please call me if any further GI concerns or questions.  We would like to thank you for the opportunity to participate in the care of Jane Campbell.    LOS: 7 days   Wyline Mood, MD 12/07/2020, 11:37 AM

## 2020-12-07 NOTE — Progress Notes (Signed)
PROGRESS NOTE    Jane Campbell  PNT:[id number] DOB: 03-Jul-1926 DOA: 11/30/2020 PCP: Oneita Hurt, No    Brief Narrative:  Jane Campbell is a 87 year old female with past medical history significant for paroxysmal atrial fibrillation, CAD, type 2 diabetes mellitus, essential hypertension, chronic lower extremity edema, hypothyroidism, recently started on anticoagulation with Eliquis 11/09/2020 who presented to [facility] ED on 10/24 with complaints of crampy abdominal pain, rectal bleeding with melena/bright red blood.  Also reports weakness at home generalized fatigue and had to lower herself to the ground, no syncopal episode.  In the ED, afebrile, BP 120/90, HR 150, RR 22, SPO2 98% on room air.  WBC 12.9, hemoglobin 11.2 Hgb 12.3 one month ago), platelets 195, sodium 137, potassium 3.3, chloride 98, CO2 31, BUN 56, creatinine 1.05.  INR 1.5, glucose 168.  FOBT positive.  Gastroenterology consulted.  TRH consulted for further evaluation and management of GI bleed.  Assessment & Plan:   Principal Problem:   GI bleed Active Problems:   Atrial fibrillation with RVR (HCC)   Essential hypertension   Hyperlipidemia   CAD (coronary artery disease)   Edema   Diabetes (HCC)   Hypokalemia   Acute upper GI bleed 2/2 duodenal ulcer Patient presenting to ED with generalized weakness, fatigue, crampy abdominal pain and rectal bleeding.  Recently started on anticoagulation with Eliquis on 11/09/2020.  FOBT positive.  Hemoglobin 11.2 on admission.  Underwent EGD on 12/02/2020 with findings of nonbleeding duodenal ulcer with nonbleeding visible vessel treated with epinephrine injection and bipolar cautery. --Gastroenterology following, appreciate assistance --Hgb 11.2>11.5>9.4>8.4>9.2>>8.2>8.6>8.8>9.4 --Eliquis discontinued --Protonix 40mg  IV PO q12h x 8 weeks followed by once daily --Carafate 1 g p.o. TIDAC/HS --soft diet --Outpatient follow-up with gastroenterology  Paroxysmal atrial fibrillation with RVR Initially  started on a diltiazem drip which was discontinued due to hypotension.  Cardiology was initially consulted and followed during hospital course, now signed off. --Anticoagulation discontinued due to GI bleed as above --Amiodarone 200 mg BID --Metoprolol tartrate 75 mg p.o. twice daily --Continue monitor on telemetry --Outpatient follow-up with Dr. [name] 1 week after discharge  Chronic diastolic congestive heart failure, decompensated TTE with LVEF 60 to 65%, no LV regional wall motion normalities, moderate LVH, LA moderately dilated, RA moderately dilated, moderate TR,/MR, IVC normal in size.  BNP elevated 300.  Chest x-ray with vascular congestion. --net negative 1.1L past 24h and net negative 1.3L since admission --Furosemide 40 mg IV q12h --Continues supplemental O2, maintain SPO2 > 92%, on 2 L nasal cannula --Strict I's and O's Daily weights  Hypokalemia: Resolved --Repleted --BMP/magnesium in a.m.  Left lower extremity cellulitis --Ceftriaxone transitioned to Keflex 500 mg p.o. q6h 10/29  Hypomagnesemia: Resolved Repleted  Essential hypertension On metoprolol succinate 50 mg p.o. twice daily at home. --BP 133/73 this morning --Increase Metoprolol to 25mg  BID, amiodarone 200 mg twice daily --Continue monitor BP closely in the setting of GI bleed  Hyperlipidemia: Atorvastatin 10 mg p.o. daily  Type 2 diabetes mellitus Diet controlled at home.  Hemoglobin A1c 5.7, well controlled. --SSI for coverage --CBGs qAC/HS  Hypothyroidism TSH 1.329, free T4 1.73, T3 51. --Levothyroxine 125 mcg p.o. daily  Anxiety: Alprazolam 0.25 mg p.o. 3 times daily as needed anxiety  Weakness/deconditioning/debility: --PT/OT recommending SNF; TOC for placement   DVT prophylaxis: SCDs Start: 12/01/20 0402   Code Status: DNR Family Communication: No family present at bedside this morning.  Disposition Plan:  Level of care: Med-Surg Status is: Inpatient  Remains inpatient appropriate  because: Pending SNF placement  Consultants:  Cardiology - signed off 10/27 Gastroenterology  Procedures:  EGD 10/26  Antimicrobials:  Ceftriaxone 10/25 - 10/29 Keflex 10/29>>   Subjective: Patient seen examined at bedside, lying in bed.  Continues to feel weak and debilitated.  Talking about consideration of hospice.  Cough improved.  Respiratory status improved.  On IV furosemide.  No other specific complaints or concerns at this time.  Pending SNF placement.  Denies headache, no dizziness, no chest pain, no palpitations, no abdominal pain, no fever/chills/night sweats, no nausea/vomiting/diarrhea.  No acute concerns overnight per nurse staff.  Objective: Vitals:   12/07/20 0000 12/07/20 0400 12/07/20 0830 12/07/20 1213  BP: 104/73 104/79 (!) 118/99 93/70  Pulse: 96  (!) 108 99  Resp: 18 18 18 19   Temp:  (!) 97.5 F (36.4 C) 97.8 F (36.6 C) 98.2 F (36.8 C)  TempSrc:  Axillary Oral Oral  SpO2: 99% 94% 98% 100%  Weight:      Height:        Intake/Output Summary (Last 24 hours) at 12/07/2020 1340 Last data filed at 12/07/2020 1215 Gross per 24 hour  Intake --  Output 3350 ml  Net -3350 ml   Filed Weights   12/02/20 1247  Weight: 72.6 kg    Examination:  General exam: Appears calm and comfortable, elderly in appearance Respiratory system: Clear to auscultation. Respiratory effort normal.  On 2 L nasal cannula with SPO2 100% at rest Cardiovascular system: S1 & S2 heard, RRR. No JVD, murmurs, rubs, gallops or clicks. No pedal edema. Gastrointestinal system: Abdomen is nondistended, soft and nontender. No organomegaly or masses felt. Normal bowel sounds heard. Central nervous system: Alert and oriented. No focal neurological deficits. Extremities: Symmetric 5 x 5 power. Skin: No rashes, lesions or ulcers Psychiatry: Judgement and insight appear normal. Mood & affect appropriate.     Data Reviewed: I have personally reviewed following labs and imaging  studies  CBC: Recent Labs  Lab 11/30/20 2108 12/01/20 0019 12/02/20 0755 12/03/20 0555 12/03/20 1158 12/04/20 0035 12/04/20 0826 12/05/20 0615 12/07/20 0709  WBC 12.9*   < > 11.5* 10.9*  --   --  9.0 8.4 9.1  NEUTROABS 10.4*  --  9.4*  --   --   --   --   --   --   HGB 11.2*   < > 9.4* 8.4* 9.2* 8.2* 8.6* 8.8* 9.4*  HCT 33.6*   < > 29.6* 27.3* 29.6* 25.8* 27.0* 28.5* 29.4*  MCV 99.7   < > 101.4* 104.6*  --   --  101.9* 102.2* 103.5*  PLT 195   < > 147* 133*  --   --  121* 123* 121*   < > = values in this interval not displayed.   Basic Metabolic Panel: Recent Labs  Lab 12/01/20 0648 12/02/20 0755 12/03/20 0555 12/04/20 0826 12/06/20 1625 12/07/20 0709  NA 139 138 139 137 139 136  K 2.8* 4.5 4.1 4.0 4.0 3.4*  CL 101 104 110 106 106 104  CO2 29 25 25 26 28 26   GLUCOSE 123* 147* 130* 107* 140* 112*  BUN 56* 49* 42* 37* 27* 22  CREATININE 0.91 1.09* 1.04* 1.09* 1.10* 1.11*  CALCIUM 7.7* 8.4* 8.6* 8.4* 8.4* 8.2*  MG 1.6* 1.6* 1.7 2.1  --   --   PHOS 2.6 2.5  --   --   --   --    GFR: Estimated Creatinine Clearance: 31.5 mL/min (A) (by C-G formula based on SCr of  1.11 mg/dL (H)). Liver Function Tests: Recent Labs  Lab 11/30/20 2108 12/01/20 0648 12/02/20 0755  AST 21 18 21   ALT 48* 35 34  ALKPHOS 59 46 52  BILITOT 1.4* 1.2 0.9  PROT 5.2* 4.2* 5.0*  ALBUMIN 3.1* 2.6* 3.1*   Recent Labs  Lab 11/30/20 2108  LIPASE 40   No results for input(s): AMMONIA in the last 168 hours. Coagulation Profile: Recent Labs  Lab 11/30/20 2108  INR 1.5*   Cardiac Enzymes: No results for input(s): CKTOTAL, CKMB, CKMBINDEX, TROPONINI in the last 168 hours. BNP (last 3 results) No results for input(s): PROBNP in the last 8760 hours. HbA1C: No results for input(s): HGBA1C in the last 72 hours.  CBG: No results for input(s): GLUCAP in the last 168 hours. Lipid Profile: No results for input(s): CHOL, HDL, LDLCALC, TRIG, CHOLHDL, LDLDIRECT in the last 72 hours. Thyroid  Function Tests: No results for input(s): TSH, T4TOTAL, FREET4, T3FREE, THYROIDAB in the last 72 hours.  Anemia Panel: No results for input(s): VITAMINB12, FOLATE, FERRITIN, TIBC, IRON, RETICCTPCT in the last 72 hours. Sepsis Labs: No results for input(s): PROCALCITON, LATICACIDVEN in the last 168 hours.  Recent Results (from the past 240 hour(s))  Resp Panel by RT-PCR (Flu A&B, Covid) Nasopharyngeal Swab     Status: None   Collection Time: 11/30/20 10:53 PM   Specimen: Nasopharyngeal Swab; Nasopharyngeal(NP) swabs in vial transport medium  Result Value Ref Range Status   SARS Coronavirus 2 by RT PCR NEGATIVE NEGATIVE Final    Comment: (NOTE) SARS-CoV-2 target nucleic acids are NOT DETECTED.  The SARS-CoV-2 RNA is generally detectable in upper respiratory specimens during the acute phase of infection. The lowest concentration of SARS-CoV-2 viral copies this assay can detect is 138 copies/mL. A negative result does not preclude SARS-Cov-2 infection and should not be used as the sole basis for treatment or other patient management decisions. A negative result may occur with  improper specimen collection/handling, submission of specimen other than nasopharyngeal swab, presence of viral mutation(s) within the areas targeted by this assay, and inadequate number of viral copies(<138 copies/mL). A negative result must be combined with clinical observations, patient history, and epidemiological information. The expected result is Negative.  Fact Sheet for Patients:  12/02/20  Fact Sheet for Healthcare Providers:  [employer]Course.com  This test is no t yet approved or cleared by the [employer] FDA and  has been authorized for detection and/or diagnosis of SARS-CoV-2 by FDA under an Emergency Use Authorization (EUA). This EUA will remain  in effect (meaning this test can be used) for the duration of the COVID-19 declaration under  Section 564(b)(1) of the Act, 21 U.S.C.section 360bbb-3(b)(1), unless the authorization is terminated  or revoked sooner.       Influenza A by PCR NEGATIVE NEGATIVE Final   Influenza B by PCR NEGATIVE NEGATIVE Final    Comment: (NOTE) The Xpert Xpress SARS-CoV-2/FLU/RSV plus assay is intended as an aid in the diagnosis of influenza from Nasopharyngeal swab specimens and should not be used as a sole basis for treatment. Nasal washings and aspirates are unacceptable for Xpert Xpress SARS-CoV-2/FLU/RSV testing.  Fact Sheet for Patients: [place]  Fact Sheet for Healthcare Providers: [employer]gerCourse.com  This test is not yet approved or cleared by the [employer]ker[employer] FDA and has been authorized for detection and/or diagnosis of SARS-CoV-2 by FDA under an Emergency Use Authorization (EUA). This EUA will remain in effect (meaning this test can be used) for the duration of the COVID-19  declaration under Section 564(b)(1) of the Act, 21 U.S.C. section 360bbb-3(b)(1), unless the authorization is terminated or revoked.  Performed at [facility], [address], [place], [place] [address]          Radiology Studies: [facility] Chest [place] 1 View  Result Date: 12/06/2020 CLINICAL DATA:  Shortness of breath. EXAM: PORTABLE CHEST 1 VIEW COMPARISON:  November 03, 2020. FINDINGS: Stable cardiomediastinal silhouette. Bibasilar opacities are noted concerning for edema or atelectasis with associated pleural effusions, right greater than left. Bony thorax is unremarkable. IMPRESSION: Bibasilar opacities concerning for edema or atelectasis with associated pleural effusions, right greater than left. Electronically Signed   By: Lupita Raider M.D.   On: 12/06/2020 16:43        Scheduled Meds:  amiodarone  200 mg Oral BID   atorvastatin  10 mg Oral QHS   cephALEXin  500 mg Oral Q6H   furosemide  40 mg Intravenous BID    levothyroxine  125 mcg Oral QAC breakfast   metoprolol tartrate  75 mg Oral BID   multivitamin with minerals  1 tablet Oral Daily   pantoprazole  40 mg Oral BID   sucralfate  1 g Oral TID WC & HS   Continuous Infusions:     LOS: 7 days    Time spent: 35 minutes spent on chart review, discussion with nursing staff, consultants, updating family and interview/physical exam; more than 50% of that time was spent in counseling and/or coordination of care.    Alvira Philips [place], DO [facility] Available via Epic secure chat 7am-7pm After these hours, please refer to coverage provider listed on [email or web address] 12/07/2020, 1:40 PM

## 2020-12-07 NOTE — Care Management Important Message (Signed)
Important Message  Patient Details  Name: Jane Campbell MRN: [id number] Date of Birth: Dec 03, 1926   Medicare Important Message Given:  Yes     Johnell Comings 12/07/2020, 3:29 PM

## 2020-12-07 NOTE — Progress Notes (Signed)
PT Cancellation Note  Patient Details Name: Jane Campbell MRN: [id number] DOB: 11/22/1926   Cancelled Treatment:     Treatment held due high diastolic and HR increasing to 433'I from resting 97bpm while talking. Pt unable to tolerate PT this attempt.  Will continue per POC next availability.   [name] 12/07/2020, 12:07 PM

## 2020-12-08 DIAGNOSIS — I2510 Atherosclerotic heart disease of native coronary artery without angina pectoris: Secondary | ICD-10-CM | POA: Diagnosis not present

## 2020-12-08 DIAGNOSIS — K264 Chronic or unspecified duodenal ulcer with hemorrhage: Secondary | ICD-10-CM | POA: Diagnosis not present

## 2020-12-08 DIAGNOSIS — R609 Edema, unspecified: Secondary | ICD-10-CM | POA: Diagnosis not present

## 2020-12-08 DIAGNOSIS — I4891 Unspecified atrial fibrillation: Secondary | ICD-10-CM | POA: Diagnosis not present

## 2020-12-08 MED ORDER — POTASSIUM CHLORIDE CRYS ER 20 MEQ PO TBCR
30.0000 meq | EXTENDED_RELEASE_TABLET | ORAL | Status: AC
  Administered 2020-12-08: 30 meq via ORAL
  Filled 2020-12-08: qty 1

## 2020-12-08 MED ORDER — LORATADINE 10 MG PO TABS
10.0000 mg | ORAL_TABLET | Freq: Every day | ORAL | Status: DC
  Administered 2020-12-08 – 2020-12-21 (×14): 10 mg via ORAL
  Filled 2020-12-08 (×14): qty 1

## 2020-12-08 MED ORDER — POTASSIUM CHLORIDE CRYS ER 20 MEQ PO TBCR
30.0000 meq | EXTENDED_RELEASE_TABLET | Freq: Once | ORAL | Status: AC
  Administered 2020-12-08: 30 meq via ORAL
  Filled 2020-12-08: qty 1

## 2020-12-08 MED ORDER — FUROSEMIDE 10 MG/ML IJ SOLN
40.0000 mg | Freq: Two times a day (BID) | INTRAMUSCULAR | Status: DC
  Administered 2020-12-08 – 2020-12-09 (×3): 40 mg via INTRAVENOUS
  Filled 2020-12-08 (×3): qty 4

## 2020-12-08 MED ORDER — GUAIFENESIN-DM 100-10 MG/5ML PO SYRP
5.0000 mL | ORAL_SOLUTION | ORAL | Status: DC | PRN
  Administered 2020-12-08 (×3): 5 mL via ORAL
  Filled 2020-12-08 (×3): qty 5

## 2020-12-08 NOTE — Progress Notes (Signed)
PROGRESS NOTE    Jane Campbell  UVO:[id number] DOB: 1926/09/20 DOA: 11/30/2020 PCP: Oneita Hurt, No    Brief Narrative:  Jane Campbell is a 87 year old female with past medical history significant for paroxysmal atrial fibrillation, CAD, type 2 diabetes mellitus, essential hypertension, chronic lower extremity edema, hypothyroidism, recently started on anticoagulation with Eliquis 11/09/2020 who presented to [facility] ED on 10/24 with complaints of crampy abdominal pain, rectal bleeding with melena/bright red blood.  Also reports weakness at home generalized fatigue and had to lower herself to the ground, no syncopal episode.  In the ED, afebrile, BP 120/90, HR 150, RR 22, SPO2 98% on room air.  WBC 12.9, hemoglobin 11.2 Hgb 12.3 one month ago), platelets 195, sodium 137, potassium 3.3, chloride 98, CO2 31, BUN 56, creatinine 1.05.  INR 1.5, glucose 168.  FOBT positive.  Gastroenterology consulted.  TRH consulted for further evaluation and management of GI bleed.  Assessment & Plan:   Principal Problem:   GI bleed Active Problems:   Atrial fibrillation with RVR (HCC)   Essential hypertension   Hyperlipidemia   CAD (coronary artery disease)   Edema   Diabetes (HCC)   Hypokalemia   Acute upper GI bleed 2/2 duodenal ulcer Patient presenting to ED with generalized weakness, fatigue, crampy abdominal pain and rectal bleeding.  Recently started on anticoagulation with Eliquis on 11/09/2020.  FOBT positive.  Hemoglobin 11.2 on admission.  Underwent EGD on 12/02/2020 with findings of nonbleeding duodenal ulcer with nonbleeding visible vessel treated with epinephrine injection and bipolar cautery. --Gastroenterology following, appreciate assistance --Hgb 11.2>11.5>9.4>8.4>9.2>>8.2>8.6>8.8>9.4 --Eliquis discontinued --Protonix 40mg  PO q12h x 8 weeks followed by once daily --Carafate 1 g p.o. TIDAC/HS x 1 month --soft diet --Outpatient follow-up with gastroenterology  Paroxysmal atrial fibrillation with  RVR Initially started on a diltiazem drip which was discontinued due to hypotension.  Cardiology was initially consulted and followed during hospital course, now signed off. --Anticoagulation discontinued due to GI bleed as above --Amiodarone 200 mg BID --Metoprolol tartrate 75 mg p.o. twice daily --Continue monitor on telemetry --Outpatient follow-up with Dr. 1 week after discharge  Chronic diastolic congestive heart failure, decompensated TTE with LVEF 60 to 65%, no LV regional wall motion normalities, moderate LVH, LA moderately dilated, RA moderately dilated, moderate TR,/MR, IVC normal in size.  BNP elevated 300.  Chest x-ray with vascular congestion. --net negative 2.9L past 24h and net negative 3.2L since admission --No weight recorded today --Furosemide 40 mg IV q12h --Continues supplemental O2, maintain SPO2 > 92%, on 2 L nasal cannula with SPO2 100% at rest, wean to room air --Strict I's and O's Daily weights  Hypokalemia:  Potassium 3.4 this morning, will replete --BMP/magnesium in a.m.  Hypomagnesemia: Resolved Repleted  Left lower extremity cellulitis --Ceftriaxone transitioned to Keflex 500 mg p.o. q6h 10/29  Essential hypertension On metoprolol succinate 50 mg p.o. twice daily at home. --BP 133/73 this morning --Increased Metoprolol to 75mg  BID, amiodarone 200 mg twice daily --Continue monitor BP closely in the setting of GI bleed  Hyperlipidemia: Atorvastatin 10 mg p.o. daily  Type 2 diabetes mellitus Diet controlled at home.  Hemoglobin A1c 5.7, well controlled. --SSI for coverage --CBGs qAC/HS  Hypothyroidism TSH 1.329, free T4 1.73, T3 51. --Levothyroxine 125 mcg p.o. daily  Anxiety: Alprazolam 0.25 mg p.o. 3 times daily as needed anxiety  Weakness/deconditioning/debility: --PT/OT recommending SNF; TOC for placement   DVT prophylaxis: SCDs Start: 12/01/20 0402   Code Status: DNR Family Communication: No family present at bedside this  morning.  Disposition Plan:  Level of care: Med-Surg Status is: Inpatient  Remains inpatient appropriate because: Pending SNF placement    Consultants:  Cardiology - signed off 10/27 Gastroenterology  Procedures:  EGD 10/26  Antimicrobials:  Ceftriaxone 10/25 - 10/29 Keflex 10/29>>   Subjective: Patient seen examined at bedside, lying in bed.  Continues to feel weak and debilitated.  Continues with nonproductive cough.  No other specific complaints or concerns at this time.  Pending SNF placement.  Denies headache, no dizziness, no chest pain, no palpitations, no abdominal pain, no fever/chills/night sweats, no nausea/vomiting/diarrhea.  No acute concerns overnight per nurse staff.  TOC reports possible bed tomorrow.  Objective: Vitals:   12/08/20 0400 12/08/20 0723 12/08/20 0724 12/08/20 1142  BP: 94/68 (!) 88/73 94/71 98/70   Pulse:  99  100  Resp: 19 18  18   Temp: 98 F (36.7 C) 98.5 F (36.9 C)  98 F (36.7 C)  TempSrc: Oral Oral  Oral  SpO2: 99% 97%  100%  Weight:      Height:        Intake/Output Summary (Last 24 hours) at 12/08/2020 1331 Last data filed at 12/08/2020 1145 Gross per 24 hour  Intake 840 ml  Output 2725 ml  Net -1885 ml   Filed Weights   12/02/20 1247  Weight: 72.6 kg    Examination:  General exam: Appears calm and comfortable, elderly in appearance Respiratory system: Clear to auscultation. Respiratory effort normal.  On 2 L nasal cannula with SPO2 100% at rest Cardiovascular system: S1 & S2 heard, RRR. No JVD, murmurs, rubs, gallops or clicks. No pedal edema. Gastrointestinal system: Abdomen is nondistended, soft and nontender. No organomegaly or masses felt. Normal bowel sounds heard. Central nervous system: Alert and oriented. No focal neurological deficits. Extremities: Symmetric 5 x 5 power. Skin: No rashes, lesions or ulcers Psychiatry: Judgement and insight appear normal. Mood & affect appropriate.     Data Reviewed: I have  personally reviewed following labs and imaging studies  CBC: Recent Labs  Lab 12/02/20 0755 12/03/20 0555 12/03/20 1158 12/04/20 0035 12/04/20 0826 12/05/20 0615 12/07/20 0709  WBC 11.5* 10.9*  --   --  9.0 8.4 9.1  NEUTROABS 9.4*  --   --   --   --   --   --   HGB 9.4* 8.4* 9.2* 8.2* 8.6* 8.8* 9.4*  HCT 29.6* 27.3* 29.6* 25.8* 27.0* 28.5* 29.4*  MCV 101.4* 104.6*  --   --  101.9* 102.2* 103.5*  PLT 147* 133*  --   --  121* 123* 121*   Basic Metabolic Panel: Recent Labs  Lab 12/02/20 0755 12/03/20 0555 12/04/20 0826 12/06/20 1625 12/07/20 0709  NA 138 139 137 139 136  K 4.5 4.1 4.0 4.0 3.4*  CL 104 110 106 106 104  CO2 25 25 26 28 26   GLUCOSE 147* 130* 107* 140* 112*  BUN 49* 42* 37* 27* 22  CREATININE 1.09* 1.04* 1.09* 1.10* 1.11*  CALCIUM 8.4* 8.6* 8.4* 8.4* 8.2*  MG 1.6* 1.7 2.1  --   --   PHOS 2.5  --   --   --   --    GFR: Estimated Creatinine Clearance: 31.5 mL/min (A) (by C-G formula based on SCr of 1.11 mg/dL (H)). Liver Function Tests: Recent Labs  Lab 12/02/20 0755  AST 21  ALT 34  ALKPHOS 52  BILITOT 0.9  PROT 5.0*  ALBUMIN 3.1*   No results for input(s): LIPASE, AMYLASE in the last 168  hours.  No results for input(s): AMMONIA in the last 168 hours. Coagulation Profile: No results for input(s): INR, PROTIME in the last 168 hours.  Cardiac Enzymes: No results for input(s): CKTOTAL, CKMB, CKMBINDEX, TROPONINI in the last 168 hours. BNP (last 3 results) No results for input(s): PROBNP in the last 8760 hours. HbA1C: No results for input(s): HGBA1C in the last 72 hours.  CBG: No results for input(s): GLUCAP in the last 168 hours. Lipid Profile: No results for input(s): CHOL, HDL, LDLCALC, TRIG, CHOLHDL, LDLDIRECT in the last 72 hours. Thyroid Function Tests: No results for input(s): TSH, T4TOTAL, FREET4, T3FREE, THYROIDAB in the last 72 hours.  Anemia Panel: No results for input(s): VITAMINB12, FOLATE, FERRITIN, TIBC, IRON, RETICCTPCT in  the last 72 hours. Sepsis Labs: No results for input(s): PROCALCITON, LATICACIDVEN in the last 168 hours.  Recent Results (from the past 240 hour(s))  Resp Panel by RT-PCR (Flu A&B, Covid) Nasopharyngeal Swab     Status: None   Collection Time: 11/30/20 10:53 PM   Specimen: Nasopharyngeal Swab; Nasopharyngeal(NP) swabs in vial transport medium  Result Value Ref Range Status   SARS Coronavirus 2 by RT PCR NEGATIVE NEGATIVE Final    Comment: (NOTE) SARS-CoV-2 target nucleic acids are NOT DETECTED.  The SARS-CoV-2 RNA is generally detectable in upper respiratory specimens during the acute phase of infection. The lowest concentration of SARS-CoV-2 viral copies this assay can detect is 138 copies/mL. A negative result does not preclude SARS-Cov-2 infection and should not be used as the sole basis for treatment or other patient management decisions. A negative result may occur with  improper specimen collection/handling, submission of specimen other than nasopharyngeal swab, presence of viral mutation(s) within the areas targeted by this assay, and inadequate number of viral copies(<138 copies/mL). A negative result must be combined with clinical observations, patient history, and epidemiological information. The expected result is Negative.  Fact Sheet for Patients:  BloggerCourse.com  Fact Sheet for Healthcare Providers:  SeriousBroker.it  This test is no t yet approved or cleared by the [place] FDA and  has been authorized for detection and/or diagnosis of SARS-CoV-2 by FDA under an Emergency Use Authorization (EUA). This EUA will remain  in effect (meaning this test can be used) for the duration of the COVID-19 declaration under Section 564(b)(1) of the Act, 21 U.S.C.section 360bbb-3(b)(1), unless the authorization is terminated  or revoked sooner.       Influenza A by PCR NEGATIVE NEGATIVE Final   Influenza B by PCR  NEGATIVE NEGATIVE Final    Comment: (NOTE) The Xpert Xpress SARS-CoV-2/FLU/RSV plus assay is intended as an aid in the diagnosis of influenza from Nasopharyngeal swab specimens and should not be used as a sole basis for treatment. Nasal washings and aspirates are unacceptable for Xpert Xpress SARS-CoV-2/FLU/RSV testing.  Fact Sheet for Patients: BloggerCourse.com  Fact Sheet for Healthcare Providers: SeriousBroker.it  This test is not yet approved or cleared by the [place] FDA and has been authorized for detection and/or diagnosis of SARS-CoV-2 by FDA under an Emergency Use Authorization (EUA). This EUA will remain in effect (meaning this test can be used) for the duration of the COVID-19 declaration under Section 564(b)(1) of the Act, 21 U.S.C. section 360bbb-3(b)(1), unless the authorization is terminated or revoked.  Performed at [facility], [address], [place], [place] [address]          Radiology Studies: [facility] 1 View  Result Date: 12/06/2020 CLINICAL DATA:  Shortness of  breath. EXAM: PORTABLE CHEST 1 VIEW COMPARISON:  November 03, 2020. FINDINGS: Stable cardiomediastinal silhouette. Bibasilar opacities are noted concerning for edema or atelectasis with associated pleural effusions, right greater than left. Bony thorax is unremarkable. IMPRESSION: Bibasilar opacities concerning for edema or atelectasis with associated pleural effusions, right greater than left. Electronically Signed   By: Lupita Raider M.D.   On: 12/06/2020 16:43        Scheduled Meds:  amiodarone  200 mg Oral BID   atorvastatin  10 mg Oral QHS   cephALEXin  500 mg Oral Q6H   furosemide  40 mg Intravenous BID   levothyroxine  125 mcg Oral QAC breakfast   loratadine  10 mg Oral Daily   metoprolol tartrate  75 mg Oral BID   multivitamin with minerals  1 tablet Oral Daily   pantoprazole  40 mg Oral BID   potassium  chloride  30 mEq Oral Q3H   sucralfate  1 g Oral TID WC & HS   Continuous Infusions:     LOS: 8 days    Time spent: 35 minutes spent on chart review, discussion with nursing staff, consultants, updating family and interview/physical exam; more than 50% of that time was spent in counseling and/or coordination of care.    Alvira Philips [place], DO [facility] Available via Epic secure chat 7am-7pm After these hours, please refer to coverage provider listed on [email or web address] 12/08/2020, 1:31 PM

## 2020-12-08 NOTE — Progress Notes (Signed)
PT Cancellation Note  Patient Details Name: Jane Campbell MRN: [id number] DOB: Mar 07, 1926   Cancelled Treatment:    Reason Eval/Treat Not Completed: Fatigue/lethargy limiting ability to participate. Patient reports she worked with OT earlier and is exhausted. She declines PT at this time. Will continue to monitor and see as appropriate.    Gaylan Fauver 12/08/2020, 11:33 AM

## 2020-12-08 NOTE — Progress Notes (Signed)
Occupational Therapy Treatment Patient Details Name: Graciemae Delisle MRN: [id number] DOB: 10/19/1926 Today's Date: 12/08/2020   History of present illness Pt is a 87 y.o. female presenting to hospital 10/24 with c/o nausea, crampy abdominal pain, and rectal bleeding; generalized weakness and had to lower self to ground.  Pt admitted with GI bleed, a-fib with RVR, hypotension, hypokalemia, and B LE edema.  S/p EGD 10/26 (found to have acute upper GI bleed 2/2 duodenal ulcer).  PMH includes a-fib, DM, htn, on Eliquis, large hiatal hernia.   OT comments  Pt seen for OT treatment on this date. Upon arrival to room, pt awake and seated upright in recliner with daughter present. Pt agreeable to OT tx, however appears more fatigued this date. While seated in recliner, pt participated in x3 seated grooming tasks and seated UB dressing, requiring SUPERVISION/SET-UP. This date, pt perform x2 sit>stand transfers with MOD A and performed total of x16 marches, requiring b/l UE support from RW and MIN GUARD. Pt was unable to participate in further functional mobility d/t fatigue. HR 90-120 throughout session (returned to resting HR of 96bpm at end of session). Pt continues to present with decreased strength and activity tolerance and continues to benefit from skilled OT services to maximize return to PLOF and minimize risk of future falls, injury, caregiver burden, and readmission. Will continue to follow POC. Discharge recommendation remains appropriate.     Recommendations for follow up therapy are one component of a multi-disciplinary discharge planning process, led by the attending physician.  Recommendations may be updated based on patient status, additional functional criteria and insurance authorization.    Follow Up Recommendations  Skilled nursing-short term rehab (<3 hours/day)    Assistance Recommended at Discharge Frequent or constant Supervision/Assistance  Equipment Recommendations  None recommended by  OT       Precautions / Restrictions Precautions Precautions: Fall Restrictions Weight Bearing Restrictions: No       Mobility Bed Mobility               General bed mobility comments: not assessed, pt in recliner at beginning/end of session    Transfers Overall transfer level: Needs assistance Equipment used: Rolling walker (2 wheels) Transfers: Sit to/from Stand Sit to Stand: Mod assist           General transfer comment: MOD A for upward momentum and to achieve upright posture. x2 bouts     Balance Overall balance assessment: Needs assistance Sitting-balance support: No upper extremity supported;Feet unsupported Sitting balance-Leahy Scale: Good Sitting balance - Comments: steady sitting reaching within BOS   Standing balance support: Bilateral upper extremity supported;During functional activity Standing balance-Leahy Scale: Fair Standing balance comment: Pt requires MIN GUARD and b/l UE support for static standing balance                           ADL either performed or assessed with clinical judgement   ADL Overall ADL's : Needs assistance/impaired     Grooming: Wash/dry face;Oral care;Brushing hair;Supervision/safety;Set up;Sitting           Upper Body Dressing : Supervision/safety;Set up;Sitting Upper Body Dressing Details (indicate cue type and reason): to don/doff hospital gown                          Cognition Arousal/Alertness: Awake/alert Behavior During Therapy: [facility] for tasks assessed/performed Overall Cognitive Status: Within Functional Limits for tasks assessed  General Comments: Pt agreeable to OT tx, however appearing more fatigued compared to prior session          Exercises General Exercises - Lower Extremity Hip Flexion/Marching: AROM;Both;15 reps;Standing      General Comments Pt with R UE swelling. Pt with resting HR 90s while seated in recliner,  increasing to 110s with activity, and returning to 96 while seated in recliner at end of session. Pt denying dizziness throughout    Pertinent Vitals/ Pain       Pain Assessment: No/denies pain         Frequency  Min 1X/week        Progress Toward Goals  OT Goals(current goals can now be found in the care plan section)  Progress towards OT goals: Progressing toward goals  Acute Rehab OT Goals Patient Stated Goal: to "do whatever it takes" to return to PLOF OT Goal Formulation: With patient Time For Goal Achievement: 12/17/20 Potential to Achieve Goals: Good  Plan Discharge plan remains appropriate;Frequency remains appropriate       AM-PAC OT "6 Clicks" Daily Activity     Outcome Measure   Help from another person eating meals?: None Help from another person taking care of personal grooming?: A Little Help from another person toileting, which includes using toliet, bedpan, or urinal?: A Lot Help from another person bathing (including washing, rinsing, drying)?: A Lot Help from another person to put on and taking off regular upper body clothing?: A Little Help from another person to put on and taking off regular lower body clothing?: A Lot 6 Click Score: 16    End of Session Equipment Utilized During Treatment: Rolling walker (2 wheels)  OT Visit Diagnosis: Unsteadiness on feet (R26.81);Muscle weakness (generalized) (M62.81)   Activity Tolerance Patient tolerated treatment well   Patient Left in chair;with call bell/phone within reach;with chair alarm set;with family/visitor present   Nurse Communication Mobility status        Time: 1030-1102 OT Time Calculation (min): 32 min  Charges: OT General Charges $OT Visit: 1 Visit OT Treatments $Self Care/Home Management : 8-22 mins $Therapeutic Activity: 8-22 mins  Matthew Folks, OTR/L [facility] [phone]

## 2020-12-08 NOTE — Progress Notes (Signed)
Mobility Specialist - Progress Note   12/08/20 1400  Mobility  Activity Ambulated to bathroom;Transferred:  Chair to bed  Level of Assistance Standby assist, set-up cues, supervision of patient - no hands on  Assistive Device Front wheel walker  Distance Ambulated (ft) 45 ft  Mobility Ambulated with assistance in room;Out of bed for toileting  Mobility Response Tolerated well  Mobility performed by Mobility specialist  $Mobility charge 1 Mobility    During mobility: 124 HR, 96% SpO2   Pt sitting in recliner upon arrival, utilizing 3L. Ambulated to bathroom for BM prior to return to bed. ModA and extra time to stand from recliner/toilet. Alarm set.   Filiberto Pinks Mobility Specialist 12/08/20, 3:36 PM

## 2020-12-08 NOTE — TOC Progression Note (Addendum)
Transition of Care [facility]) - Progression Note    Patient Details  Name: Jane Campbell MRN: [id number] Date of Birth: 1926/05/16  [facility] CM/SW Contact  Gildardo Griffes, [place] Phone Number: 12/08/2020, 10:23 AM  Clinical Narrative:     Update: [facility] reports they are holding admissions today due to some covid cases however could potentially take patient tomorrow. CSW has reached out to other facilities such as [facility] and [facility] however no there bed offers at this time. Hopeful to have a bed avail at [facility] tomorrow for dc.    CSW spoke with patient and her daughter to discuss bed offers, agreeable to [facility].   CSW has reached out to [place] with [facility] to see when bed avail pending response.       Expected Discharge Plan and Services                                                 Social Determinants of Health (SDOH) Interventions    Readmission Risk Interventions No flowsheet data found.

## 2020-12-08 NOTE — Plan of Care (Signed)
  Problem: Education: Goal: Knowledge of General Education information will improve Description: Including pain rating scale, medication(s)/side effects and non-pharmacologic comfort measures Outcome: Progressing   Problem: Health Behavior/Discharge Planning: Goal: Ability to manage health-related needs will improve Outcome: Progressing   Problem: Clinical Measurements: Goal: Ability to maintain clinical measurements within normal limits will improve Outcome: Progressing Goal: Will remain free from infection Outcome: Progressing Goal: Diagnostic test results will improve Outcome: Progressing Goal: Respiratory complications will improve Outcome: Progressing Goal: Cardiovascular complication will be avoided Outcome: Progressing   Problem: Activity: Goal: Risk for activity intolerance will decrease Outcome: Progressing   Problem: Nutrition: Goal: Adequate nutrition will be maintained Outcome: Progressing   Problem: Coping: Goal: Level of anxiety will decrease Outcome: Progressing   Problem: Elimination: Goal: Will not experience complications related to bowel motility Outcome: Progressing Goal: Will not experience complications related to urinary retention Outcome: Progressing   Problem: Pain Managment: Goal: General experience of comfort will improve Outcome: Progressing   Problem: Safety: Goal: Ability to remain free from injury will improve Outcome: Progressing   Problem: Skin Integrity: Goal: Risk for impaired skin integrity will decrease Outcome: Progressing   Problem: Education: Goal: Ability to identify signs and symptoms of gastrointestinal bleeding will improve Outcome: Progressing   Problem: Bowel/Gastric: Goal: Will show no signs and symptoms of gastrointestinal bleeding Outcome: Progressing   Problem: Fluid Volume: Goal: Will show no signs and symptoms of excessive bleeding Outcome: Progressing   Problem: Clinical Measurements: Goal:  Complications related to the disease process, condition or treatment will be avoided or minimized Outcome: Progressing   Problem: Education: Goal: Knowledge of disease or condition will improve Outcome: Progressing Goal: Understanding of medication regimen will improve Outcome: Progressing Goal: Individualized Educational Video(s) Outcome: Progressing   Problem: Activity: Goal: Ability to tolerate increased activity will improve Outcome: Progressing   Problem: Cardiac: Goal: Ability to achieve and maintain adequate cardiopulmonary perfusion will improve Outcome: Progressing   Problem: Health Behavior/Discharge Planning: Goal: Ability to safely manage health-related needs after discharge will improve Outcome: Progressing   

## 2020-12-09 DIAGNOSIS — K264 Chronic or unspecified duodenal ulcer with hemorrhage: Secondary | ICD-10-CM | POA: Diagnosis not present

## 2020-12-09 DIAGNOSIS — I4891 Unspecified atrial fibrillation: Secondary | ICD-10-CM | POA: Diagnosis not present

## 2020-12-09 DIAGNOSIS — E876 Hypokalemia: Secondary | ICD-10-CM | POA: Diagnosis not present

## 2020-12-09 LAB — CBC
HCT: 29.3 % — ABNORMAL LOW (ref 36.0–46.0)
Hemoglobin: 9.1 g/dL — ABNORMAL LOW (ref 12.0–15.0)
MCH: 31.3 pg (ref 26.0–34.0)
MCHC: 31.1 g/dL (ref 30.0–36.0)
MCV: 100.7 fL — ABNORMAL HIGH (ref 80.0–100.0)
Platelets: 141 10*3/uL — ABNORMAL LOW (ref 150–400)
RBC: 2.91 MIL/uL — ABNORMAL LOW (ref 3.87–5.11)
RDW: 15.8 % — ABNORMAL HIGH (ref 11.5–15.5)
WBC: 7.2 10*3/uL (ref 4.0–10.5)
nRBC: 0 % (ref 0.0–0.2)

## 2020-12-09 LAB — BASIC METABOLIC PANEL
Anion gap: 7 (ref 5–15)
BUN: 22 mg/dL (ref 8–23)
CO2: 32 mmol/L (ref 22–32)
Calcium: 8.2 mg/dL — ABNORMAL LOW (ref 8.9–10.3)
Chloride: 100 mmol/L (ref 98–111)
Creatinine, Ser: 1.14 mg/dL — ABNORMAL HIGH (ref 0.44–1.00)
GFR, Estimated: 45 mL/min — ABNORMAL LOW (ref >60)
Glucose, Bld: 107 mg/dL — ABNORMAL HIGH (ref 70–99)
Potassium: 3.4 mmol/L — ABNORMAL LOW (ref 3.5–5.1)
Sodium: 139 mmol/L (ref 135–145)

## 2020-12-09 LAB — RESP PANEL BY RT-PCR (FLU A&B, COVID) ARPGX2
Influenza A by PCR: NEGATIVE
Influenza B by PCR: NEGATIVE
SARS Coronavirus 2 by RT PCR: POSITIVE — AB

## 2020-12-09 LAB — MAGNESIUM: Magnesium: 1.6 mg/dL — ABNORMAL LOW (ref 1.7–2.4)

## 2020-12-09 MED ORDER — ALPRAZOLAM 0.25 MG PO TABS: 0.2500 mg | ORAL_TABLET | Freq: Three times a day (TID) | ORAL | 0 refills | Status: AC | PRN

## 2020-12-09 MED ORDER — POTASSIUM CHLORIDE CRYS ER 20 MEQ PO TBCR: 20.0000 meq | EXTENDED_RELEASE_TABLET | Freq: Every day | ORAL | Status: AC

## 2020-12-09 MED ORDER — MAGNESIUM SULFATE 2 GM/50ML IV SOLN
2.0000 g | Freq: Once | INTRAVENOUS | Status: AC
  Administered 2020-12-09: 2 g via INTRAVENOUS
  Filled 2020-12-09: qty 50

## 2020-12-09 MED ORDER — AMIODARONE HCL 200 MG PO TABS: 200.0000 mg | ORAL_TABLET | Freq: Two times a day (BID) | ORAL | 0 refills | Status: DC

## 2020-12-09 MED ORDER — SUCRALFATE 1 GM/10ML PO SUSP: 1.0000 g | Freq: Three times a day (TID) | ORAL | 0 refills | Status: AC

## 2020-12-09 MED ORDER — FUROSEMIDE 40 MG PO TABS
40.0000 mg | ORAL_TABLET | Freq: Two times a day (BID) | ORAL | Status: DC
  Administered 2020-12-09 – 2020-12-10 (×2): 40 mg via ORAL
  Filled 2020-12-09 (×2): qty 1

## 2020-12-09 MED ORDER — NIRMATRELVIR/RITONAVIR (PAXLOVID) TABLET (RENAL DOSING): 2.0000 | ORAL_TABLET | Freq: Two times a day (BID) | ORAL | Status: DC

## 2020-12-09 MED ORDER — POTASSIUM CHLORIDE CRYS ER 20 MEQ PO TBCR
40.0000 meq | EXTENDED_RELEASE_TABLET | Freq: Two times a day (BID) | ORAL | Status: DC
  Administered 2020-12-09 – 2020-12-10 (×3): 40 meq via ORAL
  Filled 2020-12-09 (×3): qty 2

## 2020-12-09 MED ORDER — METOPROLOL TARTRATE 75 MG PO TABS: 75.0000 mg | ORAL_TABLET | Freq: Two times a day (BID) | ORAL | 0 refills | Status: AC

## 2020-12-09 MED ORDER — GUAIFENESIN-DM 100-10 MG/5ML PO SYRP
10.0000 mL | ORAL_SOLUTION | ORAL | Status: DC | PRN
  Administered 2020-12-11 – 2020-12-19 (×9): 10 mL via ORAL
  Filled 2020-12-09 (×9): qty 10

## 2020-12-09 MED ORDER — PANTOPRAZOLE SODIUM 40 MG PO TBEC: 40.0000 mg | DELAYED_RELEASE_TABLET | Freq: Two times a day (BID) | ORAL | 1 refills | Status: AC

## 2020-12-09 NOTE — Discharge Summary (Signed)
Physician Discharge Summary  Jane Campbell [id number] DOB: 1926/03/20 DOA: 11/30/2020  PCP: Pcp, No  Admit date: 11/30/2020 Discharge date: 12/09/2020  Admitted From: Assisted living facility Disposition: Skilled nursing facility  Recommendations for Outpatient Follow-up:  Follow up with PCP in 1-2 weeks Please obtain BMP/CBC in one week Schedule follow-up with cardiology.  Home Health: N/A Equipment/Devices: N/A  Discharge Condition: Stable CODE STATUS: DNR Diet recommendation: Low-salt diet  Discharge summary: Jane Campbell is a 87 year old female with past medical history significant for paroxysmal atrial fibrillation, CAD, type 2 diabetes mellitus, essential hypertension, chronic lower extremity edema, hypothyroidism, recently started on anticoagulation with Eliquis 11/09/2020 who presented to [facility] ED on 10/24 with complaints of crampy abdominal pain, rectal bleeding with melena/bright red blood.  Also reports weakness at home generalized fatigue and had to lower herself to the ground, no syncopal episode.   In the ED, afebrile, BP 120/90, HR 150, RR 22, SPO2 98% on room air.  WBC 12.9, hemoglobin 11.2 (Hgb 12.3 one month ago), platelets 195, sodium 137, potassium 3.3, chloride 98, CO2 31, BUN 56, creatinine 1.05.  INR 1.5, glucose 168.  FOBT positive.  Gastroenterology consulted.  TRH consulted for further evaluation and management of GI bleed.   Assessment & plan of care:    Acute upper GI bleed 2/2 duodenal ulcer Patient presenting to ED with generalized weakness, fatigue, crampy abdominal pain and rectal bleeding.  Recently started on anticoagulation with Eliquis on 11/09/2020.  FOBT positive.  Hemoglobin 11.2 on admission.  Underwent EGD on 12/02/2020 with findings of nonbleeding duodenal ulcer with nonbleeding visible vessel treated with epinephrine injection and bipolar cautery. -- Followed by gastroenterology.  Eliquis discontinued.  Cardiology aware. --Protonix 40mg  PO q12h x 8  weeks followed by once daily --Carafate 1 g p.o. TIDAC/HS x 1 month --soft diet --Outpatient follow-up with gastroenterology   Paroxysmal atrial fibrillation with RVR Initially started on a diltiazem drip which was discontinued due to hypotension.  Seen by cardiology.  Currently rate controlled.  Asymptomatic. --Anticoagulation discontinued due to GI bleed as above --Amiodarone 200 mg BID --Metoprolol tartrate 75 mg p.o. twice daily --Outpatient follow-up with Dr. Nehemiah Massed 1 week after discharge   Chronic diastolic congestive heart failure, decompensated TTE with LVEF 60 to 65%, no LV regional wall motion normalities, moderate LVH, LA moderately dilated, RA moderately dilated, moderate TR,/MR, IVC normal in size.  BNP elevated 300.  Chest x-ray with vascular congestion. -- Treated with IV Lasix.  Euvolemic today. -- She will go back on Lasix 40 mg daily along with potassium supplements.   Hypokalemia: Adequate.  stay on 20 mEq potassium chloride while she is on Lasix.  Hypomagnesemia: 1.6.  Given 2 g of magnesium rider before discharge.  Left lower extremity cellulitis -- Mostly chronic.  Local wound care and moisturizer.   Essential hypertension On metoprolol succinate 50 mg p.o. twice daily at home. --BP stable. --Increased Metoprolol to 75mg  BID, amiodarone 200 mg twice daily --Continue monitor BP closely in the setting of GI bleed   Hyperlipidemia: Atorvastatin 10 mg p.o. daily.  Resume.   Type 2 diabetes mellitus Diet controlled at home.  Hemoglobin A1c 5.7, well controlled. -- No indication to treat at this time.   Hypothyroidism TSH 1.329, free T4 1.73, T3 51. --Levothyroxine 125 mcg p.o. daily   Anxiety: Alprazolam 0.25 mg p.o. 3 times daily as needed anxiety   Weakness/deconditioning/debility: --PT/OT recommending SNF;    Patient is medically stable to transfer to skilled level of care.  Discharge Diagnoses:  Principal Problem:   GI bleed Active Problems:    Atrial fibrillation with RVR (HCC)   Essential hypertension   Hyperlipidemia   CAD (coronary artery disease)   Edema   Diabetes ([place])   Hypokalemia    Discharge Instructions  Discharge Instructions     Call MD for:  difficulty breathing, headache or visual disturbances   Complete by: As directed    Diet - low sodium heart healthy   Complete by: As directed    Increase activity slowly   Complete by: As directed    No wound care   Complete by: As directed       Allergies as of 12/09/2020       Reactions   Penicillins Rash   States when PCN first came out; does not remember if she tried PCN since        Medication List     STOP taking these medications    apixaban 2.5 MG Tabs tablet Commonly known as: ELIQUIS   diltiazem 120 MG 24 hr capsule Commonly known as: CARDIZEM CD   metoprolol succinate 50 MG 24 hr tablet Commonly known as: TOPROL-XL       TAKE these medications    acetaminophen 325 MG tablet Commonly known as: TYLENOL Take 650 mg by mouth 3 (three) times daily as needed for mild pain.   albuterol 108 (90 Base) MCG/ACT inhaler Commonly known as: VENTOLIN HFA Inhale 2 puffs into the lungs every 6 (six) hours as needed for wheezing or shortness of breath.   ALPRAZolam 0.25 MG tablet Commonly known as: XANAX Take 0.25 mg by mouth 3 (three) times daily as needed for anxiety.   amiodarone 200 MG tablet Commonly known as: PACERONE Take 1 tablet (200 mg total) by mouth 2 (two) times daily.   atorvastatin 10 MG tablet Commonly known as: LIPITOR Take 10 mg by mouth at bedtime.   Cranberry 500 MG Caps Take 500 mg by mouth daily.   fluticasone 50 MCG/ACT nasal spray Commonly known as: FLONASE Place 2 sprays into both nostrils daily as needed for allergies or rhinitis.   furosemide 40 MG tablet Commonly known as: LASIX Take 40 mg by mouth daily.   levothyroxine 125 MCG tablet Commonly known as: SYNTHROID Take 1 tablet (125 mcg total) by  mouth daily before breakfast.   loperamide 2 MG capsule Commonly known as: IMODIUM Take 2 mg by mouth 4 (four) times daily as needed for diarrhea or loose stools.   Metoprolol Tartrate 75 MG Tabs Take 75 mg by mouth 2 (two) times daily.   multivitamin with minerals tablet Take 1 tablet by mouth daily.   ondansetron 4 MG tablet Commonly known as: ZOFRAN Take 4 mg by mouth 2 (two) times daily as needed for nausea or vomiting.   pantoprazole 40 MG tablet Commonly known as: PROTONIX Take 1 tablet (40 mg total) by mouth 2 (two) times daily.   potassium chloride SA 20 MEQ tablet Commonly known as: KLOR-CON Take 1 tablet (20 mEq total) by mouth daily.   senna 8.6 MG tablet Commonly known as: SENOKOT Take 2 tablets by mouth at bedtime.   sucralfate 1 GM/10ML suspension Commonly known as: CARAFATE Take 10 mLs (1 g total) by mouth 4 (four) times daily -  with meals and at bedtime.        Follow-up Information     Corey Skains, MD. Go in 1 week(s).   Specialty: Cardiology Contact information: [address] [facility] [place] [address] [phone]                Allergies  Allergen Reactions   Penicillins Rash    States when PCN first came out; does not remember if she tried PCN since    Consultations: Gastroenterology Cardiology   Procedures/Studies: DG Chest Port 1 View  Result Date: 12/06/2020 CLINICAL DATA:  Shortness of breath. EXAM: PORTABLE CHEST 1 VIEW COMPARISON:  November 03, 2020. FINDINGS: Stable cardiomediastinal silhouette. Bibasilar opacities are noted concerning for edema or atelectasis with associated pleural effusions, right greater than left. Bony thorax is unremarkable. IMPRESSION: Bibasilar opacities concerning for edema or atelectasis with associated pleural effusions, right greater than left. Electronically Signed   By: Marijo Conception M.D.   On: 12/06/2020 16:43   CT ANGIO GI BLEED  Result  Date: 11/30/2020 CLINICAL DATA:  GI bleed. EXAM: CTA ABDOMEN AND PELVIS WITHOUT AND WITH CONTRAST TECHNIQUE: Multidetector CT imaging of the abdomen and pelvis was performed using the standard protocol during bolus administration of intravenous contrast. Multiplanar reconstructed images and MIPs were obtained and reviewed to evaluate the vascular anatomy. CONTRAST:  160mL OMNIPAQUE IOHEXOL 350 MG/ML SOLN COMPARISON:  None. FINDINGS: VASCULAR Aorta: Moderate atherosclerotic calcification. No aneurysmal dilatation or dissection. No periaortic fluid or hematoma. There is a focal area of penetrating ulcer involving the right lateral wall of the infrarenal abdominal aorta measuring approximately 7 mm in depth. Celiac: Atherosclerotic calcification of the origin of the celiac axis. The celiac axis and its major branches are patent. SMA: Patent without evidence of aneurysm, dissection, vasculitis or significant stenosis. Renals: Atherosclerotic calcification of the origin of the right renal artery. The right renal artery remains patent. IMA: Patent without evidence of aneurysm, dissection, vasculitis or significant stenosis. Inflow: Atherosclerotic calcification of the iliac arteries. No aneurysmal dilatation or dissection. Proximal Outflow: Bilateral common femoral and visualized portions of the superficial and profunda femoral arteries are patent without evidence of aneurysm, dissection, vasculitis or significant stenosis. Veins: No obvious venous abnormality within the limitations of this arterial phase study. Review of the MIP images confirms the above findings. NON-VASCULAR Lower chest: Partially visualized moderate right pleural effusion. No intra-abdominal free air or free fluid. Hepatobiliary: The liver is unremarkable. No intrahepatic biliary dilatation. Multiple stones. No pericholecystic fluid or evidence of acute cholecystitis by CT. Pancreas: Indeterminate faint 11 mm hypodense lesion in the distal pancreas  which is not characterized on this CT, possibly a side branch IPMN. No acute inflammatory changes. No dilatation of the main pancreatic duct or gland atrophy. Spleen: Normal in size without focal abnormality. Adrenals/Urinary Tract: The right adrenal gland is unremarkable. 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 No hydronephrosis. Several small right renal cysts. The urinary bladder is grossly unremarkable. Stomach/Bowel: Diffuse diverticulosis of the descending colon and severe sigmoid diverticulosis without active inflammatory changes. No bowel obstruction or active inflammation. There is a large hiatal hernia. No extravasation of contrast to suggest active GI bleed. Appendectomy. Lymphatic: No adenopathy. Reproductive: Hysterectomy. Other: Faint 2.3 x 1.3 cm focal area of high attenuating prominence in the right rectus muscle which may represent focal area of muscular prominence versus a small hematoma (77/5). Musculoskeletal: Osteopenia with degenerative changes of the spine and scoliosis. No acute osseous pathology. IMPRESSION: 1. No evidence of active GI bleed. 2. Severe colonic diverticulosis without active inflammatory changes. 3. Large hiatal hernia. 4. Cholelithiasis. 5. Solitary right kidney. 6. Partially visualized moderate right pleural effusion.  Electronically Signed   By: Elgie Collard M.D.   On: 11/30/2020 23:45   (Echo, Carotid, EGD, Colonoscopy, ERCP)    Subjective: Patient seen and examined.  Poor historian.  Denies any complaints.  Denies any nausea vomiting.  Denies any abdomen pain or cramping.   Discharge Exam: Vitals:   12/09/20 0802 12/09/20 0805  BP: 100/82   Pulse: 78   Resp: 20   Temp: 97.8 F (36.6 C)   SpO2: 100% 95%   Vitals:   12/09/20 0418 12/09/20 0456 12/09/20 0802 12/09/20 0805  BP: 103/86  100/82   Pulse: 95  78   Resp: 18  20   Temp: 97.6 F (36.4 C)  97.8 F (36.6 C)   TempSrc: Oral  Oral   SpO2: 100%  100% 95%  Weight:   75.7 kg    Height:        General: Pt is alert, awake, not in acute distress Cardiovascular: RRR, S1/S2 +, no rubs, no gallops Respiratory: CTA bilaterally, no wheezing, no rhonchi Abdominal: Soft, NT, ND, bowel sounds + Extremities: Mostly chronic venous stasis changes, 1+ edema left leg more than right.    The results of significant diagnostics from this hospitalization (including imaging, microbiology, ancillary and laboratory) are listed below for reference.     Microbiology: Recent Results (from the past 240 hour(s))  Resp Panel by RT-PCR (Flu A&B, Covid) Nasopharyngeal Swab     Status: None   Collection Time: 11/30/20 10:53 PM   Specimen: Nasopharyngeal Swab; Nasopharyngeal(NP) swabs in vial transport medium  Result Value Ref Range Status   SARS Coronavirus 2 by RT PCR NEGATIVE NEGATIVE Final    Comment: (NOTE) SARS-CoV-2 target nucleic acids are NOT DETECTED.  The SARS-CoV-2 RNA is generally detectable in upper respiratory specimens during the acute phase of infection. The lowest concentration of SARS-CoV-2 viral copies this assay can detect is 138 copies/mL. A negative result does not preclude SARS-Cov-2 infection and should not be used as the sole basis for treatment or other patient management decisions. A negative result may occur with  improper specimen collection/handling, submission of specimen other than nasopharyngeal swab, presence of viral mutation(s) within the areas targeted by this assay, and inadequate number of viral copies(<138 copies/mL). A negative result must be combined with clinical observations, patient history, and epidemiological information. The expected result is Negative.  Fact Sheet for Patients:  BloggerCourse.com  Fact Sheet for Healthcare Providers:  SeriousBroker.it  This test is no t yet approved or cleared by the [place] FDA and  has been authorized for detection and/or  diagnosis of SARS-CoV-2 by FDA under an Emergency Use Authorization (EUA). This EUA will remain  in effect (meaning this test can be used) for the duration of the COVID-19 declaration under Section 564(b)(1) of the Act, 21 U.S.C.section 360bbb-3(b)(1), unless the authorization is terminated  or revoked sooner.       Influenza A by PCR NEGATIVE NEGATIVE Final   Influenza B by PCR NEGATIVE NEGATIVE Final    Comment: (NOTE) The Xpert Xpress SARS-CoV-2/FLU/RSV plus assay is intended as an aid in the diagnosis of influenza from Nasopharyngeal swab specimens and should not be used as a sole basis for treatment. Nasal washings and aspirates are unacceptable for Xpert Xpress SARS-CoV-2/FLU/RSV testing.  Fact Sheet for Patients: BloggerCourse.com  Fact Sheet for Healthcare Providers: SeriousBroker.it  This test is not yet approved or cleared by the [place] FDA and has been authorized for detection and/or diagnosis of SARS-CoV-2 by FDA  under an Emergency Use Authorization (EUA). This EUA will remain in effect (meaning this test can be used) for the duration of the COVID-19 declaration under Section 564(b)(1) of the Act, 21 U.S.C. section 360bbb-3(b)(1), unless the authorization is terminated or revoked.  Performed at [facility], [address] [place], [place] [address]      Labs: BNP (last 3 results) Recent Labs    11/03/20 1317 12/02/20 0755 12/06/20 1625  BNP 341.8* 190.0* [id number]   Basic Metabolic Panel: Recent Labs  Lab 12/03/20 0555 12/04/20 0826 12/06/20 1625 12/07/20 0709 12/09/20 0538  NA 139 137 139 136 139  K 4.1 4.0 4.0 3.4* 3.4*  CL 110 106 106 104 100  CO2 25 26 28 26  32  GLUCOSE 130* 107* 140* 112* 107*  BUN 42* 37* 27* 22 22  CREATININE 1.04* 1.09* 1.10* 1.11* 1.14*  CALCIUM 8.6* 8.4* 8.4* 8.2* 8.2*  MG 1.7 2.1  --   --  1.6*   Liver Function Tests: No results for input(s): AST,  ALT, ALKPHOS, BILITOT, PROT, ALBUMIN in the last 168 hours. No results for input(s): LIPASE, AMYLASE in the last 168 hours. No results for input(s): AMMONIA in the last 168 hours. CBC: Recent Labs  Lab 12/03/20 0555 12/03/20 1158 12/04/20 0035 12/04/20 0826 12/05/20 0615 12/07/20 0709 12/09/20 0538  WBC 10.9*  --   --  9.0 8.4 9.1 7.2  HGB 8.4*   < > 8.2* 8.6* 8.8* 9.4* 9.1*  HCT 27.3*   < > 25.8* 27.0* 28.5* 29.4* 29.3*  MCV 104.6*  --   --  101.9* 102.2* 103.5* 100.7*  PLT 133*  --   --  121* 123* 121* 141*   < > = values in this interval not displayed.   Cardiac Enzymes: No results for input(s): CKTOTAL, CKMB, CKMBINDEX, TROPONINI in the last 168 hours. BNP: Invalid input(s): POCBNP CBG: No results for input(s): GLUCAP in the last 168 hours. D-Dimer No results for input(s): DDIMER in the last 72 hours. Hgb A1c No results for input(s): HGBA1C in the last 72 hours. Lipid Profile No results for input(s): CHOL, HDL, LDLCALC, TRIG, CHOLHDL, LDLDIRECT in the last 72 hours. Thyroid function studies No results for input(s): TSH, T4TOTAL, T3FREE, THYROIDAB in the last 72 hours.  Invalid input(s): FREET3 Anemia work up No results for input(s): VITAMINB12, FOLATE, FERRITIN, TIBC, IRON, RETICCTPCT in the last 72 hours. Urinalysis    Component Value Date/Time   COLORURINE YELLOW (A) 11/03/2020 1317   APPEARANCEUR HAZY (A) 11/03/2020 1317   LABSPEC 1.011 11/03/2020 1317   PHURINE 7.0 11/03/2020 1317   GLUCOSEU NEGATIVE 11/03/2020 1317   HGBUR NEGATIVE 11/03/2020 1317   [place] 11/03/2020 1317   [place] 11/03/2020 1317   PROTEINUR NEGATIVE 11/03/2020 1317   NITRITE NEGATIVE 11/03/2020 1317   LEUKOCYTESUR SMALL (A) 11/03/2020 1317   Sepsis Labs Invalid input(s): PROCALCITONIN,  WBC,  LACTICIDVEN Microbiology Recent Results (from the past 240 hour(s))  Resp Panel by RT-PCR (Flu A&B, Covid) Nasopharyngeal Swab     Status: None   Collection Time:  11/30/20 10:53 PM   Specimen: Nasopharyngeal Swab; Nasopharyngeal(NP) swabs in vial transport medium  Result Value Ref Range Status   SARS Coronavirus 2 by RT PCR NEGATIVE NEGATIVE Final    Comment: (NOTE) SARS-CoV-2 target nucleic acids are NOT DETECTED.  The SARS-CoV-2 RNA is generally detectable in upper respiratory specimens during the acute phase of infection. The lowest concentration of SARS-CoV-2 viral copies this assay can detect is  138 copies/mL. A negative result does not preclude SARS-Cov-2 infection and should not be used as the sole basis for treatment or other patient management decisions. A negative result may occur with  improper specimen collection/handling, submission of specimen other than nasopharyngeal swab, presence of viral mutation(s) within the areas targeted by this assay, and inadequate number of viral copies(<138 copies/mL). A negative result must be combined with clinical observations, patient history, and epidemiological information. The expected result is Negative.  Fact Sheet for Patients:  EntrepreneurPulse.com.au  Fact Sheet for Healthcare Providers:  IncredibleEmployment.be  This test is no t yet approved or cleared by the [place] FDA and  has been authorized for detection and/or diagnosis of SARS-CoV-2 by FDA under an Emergency Use Authorization (EUA). This EUA will remain  in effect (meaning this test can be used) for the duration of the COVID-19 declaration under Section 564(b)(1) of the Act, 21 U.S.C.section 360bbb-3(b)(1), unless the authorization is terminated  or revoked sooner.       Influenza A by PCR NEGATIVE NEGATIVE Final   Influenza B by PCR NEGATIVE NEGATIVE Final    Comment: (NOTE) The Xpert Xpress SARS-CoV-2/FLU/RSV plus assay is intended as an aid in the diagnosis of influenza from Nasopharyngeal swab specimens and should not be used as a sole basis for treatment. Nasal washings  and aspirates are unacceptable for Xpert Xpress SARS-CoV-2/FLU/RSV testing.  Fact Sheet for Patients: EntrepreneurPulse.com.au  Fact Sheet for Healthcare Providers: IncredibleEmployment.be  This test is not yet approved or cleared by the [place] FDA and has been authorized for detection and/or diagnosis of SARS-CoV-2 by FDA under an Emergency Use Authorization (EUA). This EUA will remain in effect (meaning this test can be used) for the duration of the COVID-19 declaration under Section 564(b)(1) of the Act, 21 U.S.C. section 360bbb-3(b)(1), unless the authorization is terminated or revoked.  Performed at [facility], [address] [place], [place] [address]      Time coordinating discharge:  35 minutes  SIGNED:   Barb Merino, MD  [facility] 12/09/2020, 1:05 PM

## 2020-12-09 NOTE — Progress Notes (Signed)
Physical Therapy Treatment Patient Details Name: Jane Campbell MRN: [id number] DOB: June 04, 1926 Today's Date: 12/09/2020   History of Present Illness Pt is a 87 y.o. female presenting to hospital 10/24 with c/o nausea, crampy abdominal pain, and rectal bleeding; generalized weakness and had to lower self to ground.  Pt admitted with GI bleed, a-fib with RVR, hypotension, hypokalemia, and B LE edema.  S/p EGD 10/26 (found to have acute upper GI bleed 2/2 duodenal ulcer).  PMH includes a-fib, DM, htn, on Eliquis, large hiatal hernia.    PT Comments    Patient received in recliner. Agreeable to PT session. She requires min assist and cues for sit to stand from recliner. Ambulated 25 feet in room with RW and min guard. No lob, but slow pace, short steps. Patient will continue to benefit from skilled PT while here to improve functional independence and strength for improved safety.  She will be able to return home is she improves independence with sit to stand and can have some assistance throughout the day.         Recommendations for follow up therapy are one component of a multi-disciplinary discharge planning process, led by the attending physician.  Recommendations may be updated based on patient status, additional functional criteria and insurance authorization.  Follow Up Recommendations  Home health PT     Assistance Recommended at Discharge Frequent or constant Supervision/Assistance  Equipment Recommendations  Rolling walker (2 wheels);3in1 (PT)    Recommendations for Other Services       Precautions / Restrictions Precautions Precautions: Fall Restrictions Weight Bearing Restrictions: No     Mobility  Bed Mobility               General bed mobility comments: patient received in recliner    Transfers Overall transfer level: Needs assistance Equipment used: Rolling walker (2 wheels) Transfers: Sit to/from Stand Sit to Stand: Min assist           General transfer  comment: cues to scoot forward prior to attempting to stand. Patient flopped into recliner after walking.    Ambulation/Gait Ambulation/Gait assistance: Min guard Gait Distance (Feet): 25 Feet Assistive device: Rolling walker (2 wheels) Gait Pattern/deviations: Step-through pattern;Decreased step length - right;Decreased step length - left;Trunk flexed;Decreased stride length Gait velocity: decreased       Stairs             Wheelchair Mobility    Modified Rankin (Stroke Patients Only)       Balance Overall balance assessment: Needs assistance Sitting-balance support: Feet supported Sitting balance-Leahy Scale: Good Sitting balance - Comments: steady sitting reaching within BOS   Standing balance support: Bilateral upper extremity supported;During functional activity Standing balance-Leahy Scale: Fair Standing balance comment: Pt requires MIN GUARD and b/l UE support for static standing balance                            Cognition Arousal/Alertness: Awake/alert Behavior During Therapy: WFL for tasks assessed/performed;Flat affect Overall Cognitive Status: Within Functional Limits for tasks assessed                                          Exercises      General Comments        Pertinent Vitals/Pain Pain Assessment: No/denies pain    Home Living  Prior Function            PT Goals (current goals can now be found in the care plan section) Acute Rehab PT Goals Patient Stated Goal: to improve overall strength and mobility PT Goal Formulation: With patient Time For Goal Achievement: 12/17/20 Potential to Achieve Goals: Fair Progress towards PT goals: Progressing toward goals    Frequency    Min 2X/week      PT Plan Discharge plan needs to be updated    Co-evaluation              AM-PAC PT "6 Clicks" Mobility   Outcome Measure  Help needed turning from your back to your  side while in a flat bed without using bedrails?: A Little Help needed moving from lying on your back to sitting on the side of a flat bed without using bedrails?: A Little Help needed moving to and from a bed to a chair (including a wheelchair)?: A Little Help needed standing up from a chair using your arms (e.g., wheelchair or bedside chair)?: A Little Help needed to walk in hospital room?: A Little Help needed climbing 3-5 steps with a railing? : A Lot 6 Click Score: 17    End of Session Equipment Utilized During Treatment: Gait belt Activity Tolerance: Patient limited by fatigue Patient left: in chair;with call bell/phone within reach Nurse Communication: Mobility status PT Visit Diagnosis: Other abnormalities of gait and mobility (R26.89);Muscle weakness (generalized) (M62.81);Difficulty in walking, not elsewhere classified (R26.2)     Time: 1000-1016 PT Time Calculation (min) (ACUTE ONLY): 16 min  Charges:  $Gait Training: 8-22 mins                     Mckaylin Bastien, PT, GCS 12/09/20,10:31 AM

## 2020-12-09 NOTE — Progress Notes (Signed)
Patient was a scheduled to discharge and discharge orders ready to go to a skilled nursing rehab attached to her assisted living facility.  Screening COVID-19 test on discharge is positive for COVID-19.  Patient without infectious signs.  Unfortunately, skilled nursing rehab unable to take admissions for next 12 days with positive COVID-19 test.  Plan: Discontinue discharge orders. Will not to start on antiviral therapy with fairly asymptomatic patient.  Paxlovid contraindicated with amiodarone. Change IV Lasix to Lasix 40 mg twice daily. Will continue care in the hospital. Hopefully she can find a skilled rehab which takes care of COVID-19 positive patients.

## 2020-12-09 NOTE — Progress Notes (Signed)
Patient A&Ox4. Lungs are clear, but diminished. Strong, non productive cough. SpO2 is 95% on room air. Heart is irregular between 75-116. Assisted patient to chair this morning and ordered breakfast.

## 2020-12-09 NOTE — Plan of Care (Signed)
  Problem: Education: Goal: Knowledge of General Education information will improve Description: Including pain rating scale, medication(s)/side effects and non-pharmacologic comfort measures Outcome: Progressing   Problem: Health Behavior/Discharge Planning: Goal: Ability to manage health-related needs will improve Outcome: Progressing   Problem: Clinical Measurements: Goal: Ability to maintain clinical measurements within normal limits will improve Outcome: Progressing Goal: Will remain free from infection Outcome: Progressing Goal: Diagnostic test results will improve Outcome: Progressing Goal: Respiratory complications will improve Outcome: Progressing Goal: Cardiovascular complication will be avoided Outcome: Progressing   Problem: Activity: Goal: Risk for activity intolerance will decrease Outcome: Progressing   Problem: Nutrition: Goal: Adequate nutrition will be maintained Outcome: Progressing   Problem: Coping: Goal: Level of anxiety will decrease Outcome: Progressing   Problem: Elimination: Goal: Will not experience complications related to bowel motility Outcome: Progressing Goal: Will not experience complications related to urinary retention Outcome: Progressing   Problem: Pain Managment: Goal: General experience of comfort will improve Outcome: Progressing   Problem: Safety: Goal: Ability to remain free from injury will improve Outcome: Progressing   Problem: Skin Integrity: Goal: Risk for impaired skin integrity will decrease Outcome: Progressing   Problem: Education: Goal: Ability to identify signs and symptoms of gastrointestinal bleeding will improve Outcome: Progressing   Problem: Bowel/Gastric: Goal: Will show no signs and symptoms of gastrointestinal bleeding Outcome: Progressing   Problem: Fluid Volume: Goal: Will show no signs and symptoms of excessive bleeding Outcome: Progressing   Problem: Clinical Measurements: Goal:  Complications related to the disease process, condition or treatment will be avoided or minimized Outcome: Progressing   Problem: Education: Goal: Knowledge of disease or condition will improve Outcome: Progressing Goal: Understanding of medication regimen will improve Outcome: Progressing Goal: Individualized Educational Video(s) Outcome: Progressing   Problem: Activity: Goal: Ability to tolerate increased activity will improve Outcome: Progressing   Problem: Cardiac: Goal: Ability to achieve and maintain adequate cardiopulmonary perfusion will improve Outcome: Progressing   Problem: Health Behavior/Discharge Planning: Goal: Ability to safely manage health-related needs after discharge will improve Outcome: Progressing   

## 2020-12-09 NOTE — TOC Progression Note (Addendum)
Transition of Care [facility]) - Progression Note    Patient Details  Name: Jane Campbell MRN: [id number] Date of Birth: [birth year]  [facility] CM/SW Contact  Hetty Ely, RN Phone Number: 12/09/2020, 12:26 PM  Clinical Narrative: Called and spoke with Daughter about [facility] bed offer, daughter accepts offer, text [facility] Admissions Coordinator Tammy waiting for reply. Attending notified. Patient accepted to room 602A, however COVID test positive, Tina from [facility] says patient not eligible for admissions until 12 days. Attending notified.         Expected Discharge Plan and Services                                                 Social Determinants of Health (SDOH) Interventions    Readmission Risk Interventions No flowsheet data found.

## 2020-12-10 DIAGNOSIS — K264 Chronic or unspecified duodenal ulcer with hemorrhage: Secondary | ICD-10-CM | POA: Diagnosis not present

## 2020-12-10 MED ORDER — POTASSIUM CHLORIDE CRYS ER 20 MEQ PO TBCR
40.0000 meq | EXTENDED_RELEASE_TABLET | Freq: Every day | ORAL | Status: DC
  Administered 2020-12-11 – 2020-12-16 (×6): 40 meq via ORAL
  Filled 2020-12-10 (×6): qty 2

## 2020-12-10 MED ORDER — METOPROLOL TARTRATE 25 MG PO TABS
12.5000 mg | ORAL_TABLET | Freq: Two times a day (BID) | ORAL | Status: DC
  Administered 2020-12-10 – 2020-12-19 (×18): 12.5 mg via ORAL
  Filled 2020-12-10 (×19): qty 1

## 2020-12-10 NOTE — TOC Progression Note (Signed)
Transition of Care [facility]) - Progression Note    Patient Details  Name: Jane Campbell MRN: [id number] Date of Birth: 07/16/1926  Transition of Care [facility]) CM/SW Contact  Chapman Fitch, RN Phone Number: 12/10/2020, 12:48 PM  Clinical Narrative:      Sherron Monday to [name] at [facility]. Earliest she could come back would be 11/13. But that's a Sunday so someone from the facility would have to come out Monday 11/14, and then determine if patient can return      Expected Discharge Plan and Services           Expected Discharge Date: 12/09/20                                     Social Determinants of Health (SDOH) Interventions    Readmission Risk Interventions No flowsheet data found.

## 2020-12-10 NOTE — Progress Notes (Signed)
Physical Therapy Treatment Patient Details Name: Jane Campbell MRN: [id number] DOB: [birth year] Today's Date: 12/10/2020   History of Present Illness Pt is a 87 y.o. female presenting to hospital 10/24 with c/o nausea, crampy abdominal pain, and rectal bleeding; generalized weakness and had to lower self to ground.  Pt admitted with GI bleed, a-fib with RVR, hypotension, hypokalemia, and B LE edema.  S/p EGD 10/26 (found to have acute upper GI bleed 2/2 duodenal ulcer).  PMH includes a-fib, DM, htn, on Eliquis, large hiatal hernia.    PT Comments    Patient received in bed, attempting to eat breakfast but having a difficult time. Patient wants to get up to recliner to finish eating. She is mod independent with bed mobility. Required mod assist for sit to stand from low bed. Once standing she is min guard for ambulating to recliner. Cues needed to sit safely as she tends to flop down into chair. Patient left with breakfast tray and needs met. She will continue to benefit from skilled PT to improve strength and independence with transfers to return home.     Recommendations for follow up therapy are one component of a multi-disciplinary discharge planning process, led by the attending physician.  Recommendations may be updated based on patient status, additional functional criteria and insurance authorization.  Follow Up Recommendations  Home health PT     Assistance Recommended at Discharge Intermittent Supervision/Assistance  Equipment Recommendations  Rolling walker (2 wheels);3in1 (PT)    Recommendations for Other Services       Precautions / Restrictions Precautions Precautions: Fall Precaution Comments: COVID + Restrictions Weight Bearing Restrictions: No     Mobility  Bed Mobility Overal bed mobility: Modified Independent Bed Mobility: Supine to Sit     Supine to sit: Modified independent (Device/Increase time);HOB elevated     General bed mobility comments: patient able to  get to edge of bed without physical assist.    Transfers Overall transfer level: Needs assistance Equipment used: Rolling walker (2 wheels) Transfers: Sit to/from Stand Sit to Stand: Mod assist           General transfer comment: patient required mod assist to stand from low bed.    Ambulation/Gait Ambulation/Gait assistance: Min guard Gait Distance (Feet): 3 Feet Assistive device: Rolling walker (2 wheels) Gait Pattern/deviations: Step-through pattern;Decreased step length - right;Decreased step length - left Gait velocity: decreased   General Gait Details: patient wanted to get up to recliner to finish eating her breakfast. Gait distance limited by this.   Stairs             Wheelchair Mobility    Modified Rankin (Stroke Patients Only)       Balance Overall balance assessment: Modified Independent;Needs assistance Sitting-balance support: Feet supported Sitting balance-Leahy Scale: Good     Standing balance support: Bilateral upper extremity supported;During functional activity Standing balance-Leahy Scale: Good Standing balance comment: Pt requires MIN GUARD and b/l UE support for static standing balance                            Cognition Arousal/Alertness: Awake/alert Behavior During Therapy: WFL for tasks assessed/performed Overall Cognitive Status: Within Functional Limits for tasks assessed                                          Exercises  General Comments        Pertinent Vitals/Pain Pain Assessment: No/denies pain    Home Living                          Prior Function            PT Goals (current goals can now be found in the care plan section) Acute Rehab PT Goals Patient Stated Goal: to improve overall strength and mobility PT Goal Formulation: With patient Time For Goal Achievement: 12/17/20 Potential to Achieve Goals: Fair Progress towards PT goals: Progressing toward goals     Frequency    Min 2X/week      PT Plan Current plan remains appropriate    Co-evaluation              AM-PAC PT "6 Clicks" Mobility   Outcome Measure  Help needed turning from your back to your side while in a flat bed without using bedrails?: None Help needed moving from lying on your back to sitting on the side of a flat bed without using bedrails?: A Little Help needed moving to and from a bed to a chair (including a wheelchair)?: A Little Help needed standing up from a chair using your arms (e.g., wheelchair or bedside chair)?: A Little Help needed to walk in hospital room?: A Little Help needed climbing 3-5 steps with a railing? : A Lot 6 Click Score: 18    End of Session Equipment Utilized During Treatment: Gait belt;Oxygen Activity Tolerance: Patient tolerated treatment well Patient left: in chair;with call bell/phone within reach Nurse Communication: Mobility status PT Visit Diagnosis: Muscle weakness (generalized) (M62.81)     Time: 9611-6435 PT Time Calculation (min) (ACUTE ONLY): 12 min  Charges:  $Therapeutic Activity: 8-22 mins                     Jayen Bromwell, PT, GCS 12/10/20,10:53 AM

## 2020-12-10 NOTE — Progress Notes (Signed)
PROGRESS NOTE    Jane Campbell  WUJ:[id number] DOB: [birth year] DOA: 11/30/2020 PCP: Pcp, No   Chief Complain:Abd pain  Brief Narrative: Jane Campbell is a 87 year old female with past medical history significant for paroxysmal atrial fibrillation, CAD, type 2 diabetes mellitus, essential hypertension, chronic lower extremity edema, hypothyroidism, recently started on anticoagulation with Eliquis 11/09/2020 who presented to [facility] ED from assisted living facility on 10/24 with complaints of crampy abdominal pain, rectal bleeding with melena/bright red blood.  Also reported weakness at home generalized fatigue and had to lower herself to the ground, no syncopal episode.  In the ED, afebrile, BP 120/90, HR 150, RR 22, SPO2 98% on room air.  WBC 12.9, hemoglobin 11.2 (Hgb 12.3 one month ago), platelets 195, sodium 137, potassium 3.3, chloride 98, CO2 31, BUN 56, creatinine 1.05.  INR 1.5, glucose 168.  FOBT positive.  Gastroenterology consulted.  TRH consulted for further evaluation and management of GI bleed. Underwent EGD on 12/02/2020 with findings of nonbleeding duodenal ulcer with nonbleeding visible vessel treated with epinephrine injection and bipolar cautery.  Eliquis was discontinued and she was started on Protonix, Carafate with plan for outpatient GI follow-up.  She was planned for discharge to skilled nursing facility but her COVID screen test came up to be incidentally positive so discharge has been delayed.  Case management following.  She is medically stable for discharge  Assessment & Plan:   Principal Problem:   GI bleed Active Problems:   Atrial fibrillation with RVR (HCC)   Essential hypertension   Hyperlipidemia   CAD (coronary artery disease)   Edema   Diabetes (HCC)   Hypokalemia   Acute upper GI bleed 2/2 duodenal ulcer Patient presenting to ED with generalized weakness, fatigue, crampy abdominal pain and rectal bleeding.  Recently started on anticoagulation with Eliquis on  11/09/2020.  FOBT positive.  Hemoglobin 11.2 on admission.  Underwent EGD on 12/02/2020 with findings of nonbleeding duodenal ulcer with nonbleeding visible vessel treated with epinephrine injection and bipolar cautery. -- Followed by gastroenterology.  Eliquis discontinued.  Cardiology aware. --Protonix 40mg  PO q12h x 8 weeks followed by once daily --Carafate 1 g p.o. TIDAC/HS x 1 month --soft diet --Outpatient follow-up with gastroenterology   Paroxysmal atrial fibrillation with RVR Initially started on a diltiazem drip which was discontinued due to hypotension.  Seen by cardiology.  Currently rate controlled.  Asymptomatic. --Anticoagulation discontinued due to GI bleed as above --Amiodarone 200 mg BID --Metoprolol tartrate 75 mg p.o. twice daily --Outpatient follow-up with Dr. 1 week after discharge   Chronic diastolic congestive heart failure, decompensated TTE with LVEF 60 to 65%, no LV regional wall motion normalities, moderate LVH, LA moderately dilated, RA moderately dilated, moderate TR,/MR, IVC normal in size.  BNP elevated 300.  Chest x-ray with vascular congestion. -- Treated with IV Lasix.  Euvolemic today. -- She will go back on Lasix 40 mg daily along with potassium supplements.   Hypokalemia: Adequate.  stay on 20 mEq potassium chloride while she is on Lasix.   Hypomagnesemia: 1.6.  Given 2 g of magnesium rider before discharge.   Left lower extremity cellulitis -- Mostly chronic.  Local wound care and moisturizer.   Essential hypertension On metoprolol succinate 50 mg p.o. twice daily at home. --BP stable. --Increased Metoprolol to 75mg  BID, amiodarone 200 mg twice daily --Continue monitor BP closely in the setting of GI bleed   Hyperlipidemia: Atorvastatin 10 mg p.o. daily.  Resume.   Type 2 diabetes mellitus Diet controlled  at home.  Hemoglobin A1c 5.7, well controlled. -- No indication to treat at this time.   Hypothyroidism TSH 1.329, free T4 1.73,  T3 51. --Levothyroxine 125 mcg p.o. daily   Anxiety: Alprazolam 0.25 mg p.o. 3 times daily as needed anxiety  Incidental COVID-positive: Complains of some cough but largely asymptomatic.  Any shortness of breath.  No indication for treatment with remdesivir. Paxlovid contraindicated because she is taking amiodarone         DVT prophylaxis:SCD Code Status: DNR Family Communication: None at bedside Status is: Inpatient  Remains inpatient appropriate because: Unsafe discharge      Consultants: GI  Procedures:EGD  Antimicrobials:  Anti-infectives (From admission, onward)    Start     Dose/Rate Route Frequency Ordered Stop   12/09/20 1515  nirmatrelvir/ritonavir EUA (renal dosing) (PAXLOVID) 2 tablet  Status:  Discontinued        2 tablet Oral 2 times daily 12/09/20 1418 12/09/20 1428   12/05/20 1200  cephALEXin (KEFLEX) capsule 500 mg        500 mg Oral Every 6 hours 12/05/20 0828 12/10/20 0653   12/01/20 1300  cefTRIAXone (ROCEPHIN) 2 g in sodium chloride 0.9 % 100 mL IVPB  Status:  Discontinued        2 g 200 mL/hr over 30 Minutes Intravenous Every 24 hours 12/01/20 1200 12/05/20 0828       Subjective:  Patient seen and examined at the bedside this afternoon.  Comfortable.  Sitting on the chair.  Complains of some cough.  On oxygen at 1 L/min via nasal cannula.  Denies any shortness of breath   Objective: Vitals:   12/09/20 2212 12/10/20 0256 12/10/20 0500 12/10/20 0858  BP:  (!) 93/59  92/70  Pulse: 98 97  98  Resp:  17  18  Temp:  98.8 F (37.1 C)  97.6 F (36.4 C)  TempSrc:      SpO2:  100%  100%  Weight:   77.7 kg   Height:        Intake/Output Summary (Last 24 hours) at 12/10/2020 1401 Last data filed at 12/10/2020 0900 Gross per 24 hour  Intake 240 ml  Output 701 ml  Net -461 ml   Filed Weights   12/02/20 1247 12/09/20 0456 12/10/20 0500  Weight: 72.6 kg 75.7 kg 77.7 kg    Examination:  General exam: Overall comfortable, not in distress,  pleasant elderly female HEENT: PERRL Respiratory system:  no wheezes or crackles  Cardiovascular system: S1 & S2 heard, RRR.  Gastrointestinal system: Abdomen is nondistended, soft and nontender. Central nervous system: Alert and oriented Extremities: No edema, no clubbing ,no cyanosis Skin: No rashes, no ulcers,no icterus      Data Reviewed: I have personally reviewed following labs and imaging studies  CBC: Recent Labs  Lab 12/04/20 0035 12/04/20 0826 12/05/20 0615 12/07/20 0709 12/09/20 0538  WBC  --  9.0 8.4 9.1 7.2  HGB 8.2* 8.6* 8.8* 9.4* 9.1*  HCT 25.8* 27.0* 28.5* 29.4* 29.3*  MCV  --  101.9* 102.2* 103.5* 100.7*  PLT  --  121* 123* 121* 141*   Basic Metabolic Panel: Recent Labs  Lab 12/04/20 0826 12/06/20 1625 12/07/20 0709 12/09/20 0538  NA 137 139 136 139  K 4.0 4.0 3.4* 3.4*  CL 106 106 104 100  CO2 26 28 26  32  GLUCOSE 107* 140* 112* 107*  BUN 37* 27* 22 22  CREATININE 1.09* 1.10* 1.11* 1.14*  CALCIUM 8.4* 8.4* 8.2* 8.2*  MG 2.1  --   --  1.6*   GFR: Estimated Creatinine Clearance: 31.7 mL/min (A) (by C-G formula based on SCr of 1.14 mg/dL (H)). Liver Function Tests: No results for input(s): AST, ALT, ALKPHOS, BILITOT, PROT, ALBUMIN in the last 168 hours. No results for input(s): LIPASE, AMYLASE in the last 168 hours. No results for input(s): AMMONIA in the last 168 hours. Coagulation Profile: No results for input(s): INR, PROTIME in the last 168 hours. Cardiac Enzymes: No results for input(s): CKTOTAL, CKMB, CKMBINDEX, TROPONINI in the last 168 hours. BNP (last 3 results) No results for input(s): PROBNP in the last 8760 hours. HbA1C: No results for input(s): HGBA1C in the last 72 hours. CBG: No results for input(s): GLUCAP in the last 168 hours. Lipid Profile: No results for input(s): CHOL, HDL, LDLCALC, TRIG, CHOLHDL, LDLDIRECT in the last 72 hours. Thyroid Function Tests: No results for input(s): TSH, T4TOTAL, FREET4, T3FREE, THYROIDAB in  the last 72 hours. Anemia Panel: No results for input(s): VITAMINB12, FOLATE, FERRITIN, TIBC, IRON, RETICCTPCT in the last 72 hours. Sepsis Labs: No results for input(s): PROCALCITON, LATICACIDVEN in the last 168 hours.  Recent Results (from the past 240 hour(s))  Resp Panel by RT-PCR (Flu A&B, Covid) Nasopharyngeal Swab     Status: None   Collection Time: 11/30/20 10:53 PM   Specimen: Nasopharyngeal Swab; Nasopharyngeal(NP) swabs in vial transport medium  Result Value Ref Range Status   SARS Coronavirus 2 by RT PCR NEGATIVE NEGATIVE Final    Comment: (NOTE) SARS-CoV-2 target nucleic acids are NOT DETECTED.  The SARS-CoV-2 RNA is generally detectable in upper respiratory specimens during the acute phase of infection. The lowest concentration of SARS-CoV-2 viral copies this assay can detect is 138 copies/mL. A negative result does not preclude SARS-Cov-2 infection and should not be used as the sole basis for treatment or other patient management decisions. A negative result may occur with  improper specimen collection/handling, submission of specimen other than nasopharyngeal swab, presence of viral mutation(s) within the areas targeted by this assay, and inadequate number of viral copies(<138 copies/mL). A negative result must be combined with clinical observations, patient history, and epidemiological information. The expected result is Negative.  Fact Sheet for Patients:  BloggerCourse.com  Fact Sheet for Healthcare Providers:  SeriousBroker.it  This test is no t yet approved or cleared by the [place] FDA and  has been authorized for detection and/or diagnosis of SARS-CoV-2 by FDA under an Emergency Use Authorization (EUA). This EUA will remain  in effect (meaning this test can be used) for the duration of the COVID-19 declaration under Section 564(b)(1) of the Act, 21 U.S.C.section 360bbb-3(b)(1), unless the  authorization is terminated  or revoked sooner.       Influenza A by PCR NEGATIVE NEGATIVE Final   Influenza B by PCR NEGATIVE NEGATIVE Final    Comment: (NOTE) The Xpert Xpress SARS-CoV-2/FLU/RSV plus assay is intended as an aid in the diagnosis of influenza from Nasopharyngeal swab specimens and should not be used as a sole basis for treatment. Nasal washings and aspirates are unacceptable for Xpert Xpress SARS-CoV-2/FLU/RSV testing.  Fact Sheet for Patients: BloggerCourse.com  Fact Sheet for Healthcare Providers: SeriousBroker.it  This test is not yet approved or cleared by the [place] FDA and has been authorized for detection and/or diagnosis of SARS-CoV-2 by FDA under an Emergency Use Authorization (EUA). This EUA will remain in effect (meaning this test can be used) for the duration of the COVID-19 declaration under Section 564(b)(1)  of the Act, 21 U.S.C. section 360bbb-3(b)(1), unless the authorization is terminated or revoked.  Performed at [facility], [address] [place], [place] [address]   Resp Panel by RT-PCR (Flu A&B, Covid) Nasopharyngeal Swab     Status: Abnormal   Collection Time: 12/09/20  1:00 PM   Specimen: Nasopharyngeal Swab; Nasopharyngeal(NP) swabs in vial transport medium  Result Value Ref Range Status   SARS Coronavirus 2 by RT PCR POSITIVE (A) NEGATIVE Final    Comment: RESULT CALLED TO, READ BACK BY AND VERIFIED WITH: RACHEL CRESMAN, RN 1408 12/09/20 GM (NOTE) SARS-CoV-2 target nucleic acids are DETECTED.  The SARS-CoV-2 RNA is generally detectable in upper respiratory specimens during the acute phase of infection. Positive results are indicative of the presence of the identified virus, but do not rule out bacterial infection or co-infection with other pathogens not detected by the test. Clinical correlation with patient history and other diagnostic information is necessary  to determine patient infection status. The expected result is Negative.  Fact Sheet for Patients: BloggerCourse.com  Fact Sheet for Healthcare Providers: SeriousBroker.it  This test is not yet approved or cleared by the [place] FDA and  has been authorized for detection and/or diagnosis of SARS-CoV-2 by FDA under an Emergency Use Authorization (EUA).  This EUA will remain in effect (meaning this test can b e used) for the duration of  the COVID-19 declaration under Section 564(b)(1) of the Act, 21 U.S.C. section 360bbb-3(b)(1), unless the authorization is terminated or revoked sooner.     Influenza A by PCR NEGATIVE NEGATIVE Final   Influenza B by PCR NEGATIVE NEGATIVE Final    Comment: (NOTE) The Xpert Xpress SARS-CoV-2/FLU/RSV plus assay is intended as an aid in the diagnosis of influenza from Nasopharyngeal swab specimens and should not be used as a sole basis for treatment. Nasal washings and aspirates are unacceptable for Xpert Xpress SARS-CoV-2/FLU/RSV testing.  Fact Sheet for Patients: BloggerCourse.com  Fact Sheet for Healthcare Providers: SeriousBroker.it  This test is not yet approved or cleared by the [place] FDA and has been authorized for detection and/or diagnosis of SARS-CoV-2 by FDA under an Emergency Use Authorization (EUA). This EUA will remain in effect (meaning this test can be used) for the duration of the COVID-19 declaration under Section 564(b)(1) of the Act, 21 U.S.C. section 360bbb-3(b)(1), unless the authorization is terminated or revoked.  Performed at [facility], [address], [place], [place] [address]          Radiology Studies: No results found.      Scheduled Meds:  amiodarone  200 mg Oral BID   atorvastatin  10 mg Oral QHS   furosemide  40 mg Oral BID   levothyroxine  125 mcg Oral QAC breakfast    loratadine  10 mg Oral Daily   metoprolol tartrate  75 mg Oral BID   multivitamin with minerals  1 tablet Oral Daily   pantoprazole  40 mg Oral BID   potassium chloride  40 mEq Oral BID   sucralfate  1 g Oral TID WC & HS   Continuous Infusions:   LOS: 10 days    Time spent:25 mins, More than 50% of that time was spent in counseling and/or coordination of care.      Burnadette Pop, MD [facility] P11/04/2020, 2:01 PM

## 2020-12-10 NOTE — TOC Progression Note (Signed)
Transition of Care [facility]) - Progression Note    Patient Details  Name: Jane Campbell MRN: [id number] Date of Birth: 08-Aug-1926  Transition of Care [facility] CM/SW Contact  Chapman Fitch, RN Phone Number: 12/10/2020, 10:39 AM  Clinical Narrative:     PT has upgraded recommendations to home health Noted that patient lives at [facility]   VM left for [place] at [facility] to determine what their quarantine time frame is, and also discuss [facility] staff assessing patient prior to return  MD and daughter updated        Expected Discharge Plan and Services           Expected Discharge Date: 12/09/20                                     Social Determinants of Health (SDOH) Interventions    Readmission Risk Interventions No flowsheet data found.

## 2020-12-11 DIAGNOSIS — K264 Chronic or unspecified duodenal ulcer with hemorrhage: Secondary | ICD-10-CM | POA: Diagnosis not present

## 2020-12-11 LAB — CBC WITH DIFFERENTIAL/PLATELET
Abs Immature Granulocytes: 0.03 10*3/uL (ref 0.00–0.07)
Basophils Absolute: 0 10*3/uL (ref 0.0–0.1)
Basophils Relative: 0 %
Eosinophils Absolute: 0.1 10*3/uL (ref 0.0–0.5)
Eosinophils Relative: 2 %
HCT: 32.6 % — ABNORMAL LOW (ref 36.0–46.0)
Hemoglobin: 9.8 g/dL — ABNORMAL LOW (ref 12.0–15.0)
Immature Granulocytes: 0 %
Lymphocytes Relative: 18 %
Lymphs Abs: 1.3 10*3/uL (ref 0.7–4.0)
MCH: 30.6 pg (ref 26.0–34.0)
MCHC: 30.1 g/dL (ref 30.0–36.0)
MCV: 101.9 fL — ABNORMAL HIGH (ref 80.0–100.0)
Monocytes Absolute: 0.4 10*3/uL (ref 0.1–1.0)
Monocytes Relative: 5 %
Neutro Abs: 5.5 10*3/uL (ref 1.7–7.7)
Neutrophils Relative %: 75 %
Platelets: 191 10*3/uL (ref 150–400)
RBC: 3.2 MIL/uL — ABNORMAL LOW (ref 3.87–5.11)
RDW: 15.9 % — ABNORMAL HIGH (ref 11.5–15.5)
WBC: 7.3 10*3/uL (ref 4.0–10.5)
nRBC: 0 % (ref 0.0–0.2)

## 2020-12-11 LAB — BASIC METABOLIC PANEL
Anion gap: 9 (ref 5–15)
BUN: 24 mg/dL — ABNORMAL HIGH (ref 8–23)
CO2: 30 mmol/L (ref 22–32)
Calcium: 8.5 mg/dL — ABNORMAL LOW (ref 8.9–10.3)
Chloride: 100 mmol/L (ref 98–111)
Creatinine, Ser: 1.19 mg/dL — ABNORMAL HIGH (ref 0.44–1.00)
GFR, Estimated: 43 mL/min — ABNORMAL LOW (ref >60)
Glucose, Bld: 101 mg/dL — ABNORMAL HIGH (ref 70–99)
Potassium: 3.8 mmol/L (ref 3.5–5.1)
Sodium: 139 mmol/L (ref 135–145)

## 2020-12-11 LAB — MRSA NEXT GEN BY PCR, NASAL: MRSA by PCR Next Gen: NOT DETECTED

## 2020-12-11 LAB — MAGNESIUM: Magnesium: 1.9 mg/dL (ref 1.7–2.4)

## 2020-12-11 NOTE — Progress Notes (Signed)
PROGRESS NOTE    Jane Campbell  XQJ:[id number] DOB: [birth year] DOA: 11/30/2020 PCP: Pcp, No   Chief Complain:Abd pain  Brief Narrative: Jane Campbell is a 87 year old female with past medical history significant for paroxysmal atrial fibrillation, CAD, type 2 diabetes mellitus, essential hypertension, chronic lower extremity edema, hypothyroidism, recently started on anticoagulation with Eliquis 11/09/2020 who presented to [facility] ED from assisted living facility on 10/24 with complaints of crampy abdominal pain, rectal bleeding with melena/bright red blood.  Also reported weakness at home generalized fatigue and had to lower herself to the ground, no syncopal episode.  In the ED, afebrile, BP 120/90, HR 150, RR 22, SPO2 98% on room air.  WBC 12.9, hemoglobin 11.2 (Hgb 12.3 one month ago), platelets 195, sodium 137, potassium 3.3, chloride 98, CO2 31, BUN 56, creatinine 1.05.  INR 1.5, glucose 168.  FOBT positive.  Gastroenterology consulted.  TRH consulted for further evaluation and management of GI bleed. Underwent EGD on 12/02/2020 with findings of nonbleeding duodenal ulcer with nonbleeding visible vessel treated with epinephrine injection and bipolar cautery.  Eliquis was discontinued and she was started on Protonix, Carafate with plan for outpatient GI follow-up.  She was planned for discharge to skilled nursing facility but her COVID screen test came up to be incidentally positive so discharge has been delayed.  Case management following.  She is medically stable for discharge to SNF as soon as possible.  Assessment & Plan:   Principal Problem:   GI bleed Active Problems:   Atrial fibrillation with RVR (HCC)   Essential hypertension   Hyperlipidemia   CAD (coronary artery disease)   Edema   Diabetes (HCC)   Hypokalemia   Acute upper GI bleed 2/2 duodenal ulcer Patient presenting to ED with generalized weakness, fatigue, crampy abdominal pain and rectal bleeding.  Recently started on  anticoagulation with Eliquis on 11/09/2020.  FOBT positive.  Hemoglobin 11.2 on admission.  Underwent EGD on 12/02/2020 with findings of nonbleeding duodenal ulcer with nonbleeding visible vessel treated with epinephrine injection and bipolar cautery. -- Followed by gastroenterology.  Eliquis discontinued.  Cardiology aware. --Protonix 40mg  PO q12h x 8 weeks followed by once daily --Carafate 1 g p.o. TIDAC/HS x 1 month --soft diet --Outpatient follow-up with gastroenterology recommended   Paroxysmal atrial fibrillation with RVR Initially started on a diltiazem drip which was discontinued due to hypotension.  Seen by cardiology.  Currently rate controlled.  Asymptomatic. --Anticoagulation discontinued due to GI bleed as above --Amiodarone 200 mg BID --Metoprolol tartrate 75 mg p.o. twice daily --Outpatient follow-up with Dr. 1 week after discharge   Chronic diastolic congestive heart failure, decompensated TTE with LVEF 60 to 65%, no LV regional wall motion normalities, moderate LVH, LA moderately dilated, RA moderately dilated, moderate TR,/MR, IVC normal in size.  BNP elevated 300.  Chest x-ray had shown vascular congestion. -- Treated with IV Lasix.  Euvolemic now. -- She will go back on Lasix 40 mg daily along with potassium supplements.   Hypokalemia/Hypomagnesemia: Supplemented and corrected   Left lower extremity cellulitis -- Mostly chronic.  Local wound care and moisturizer.   Essential hypertension On metoprolol succinate 50 mg p.o. twice daily at home. --BP stable. --Increased Metoprolol to 75mg  BID, amiodarone 200 mg twice daily --Continue monitor BP closely in the setting of GI bleed   Hyperlipidemia: Atorvastatin 10 mg p.o. daily.  Resume.   Type 2 diabetes mellitus Diet controlled at home.  Hemoglobin A1c 5.7, well controlled. -- No indication to treat at this  time.   Hypothyroidism TSH 1.329, free T4 1.73, T3 51. --Levothyroxine 125 mcg p.o. daily    Anxiety: Alprazolam 0.25 mg p.o. 3 times daily as needed anxiety  Incidental COVID-positive: Complains of some cough but largely asymptomatic.  Any shortness of breath.  No indication for treatment with remdesivir. Paxlovid contraindicated because she is taking amiodarone         DVT prophylaxis:SCD Code Status: DNR Family Communication: None at bedside Status is: Inpatient  Remains inpatient appropriate because: Unsafe discharge      Consultants: GI  Procedures:EGD  Antimicrobials:  Anti-infectives (From admission, onward)    Start     Dose/Rate Route Frequency Ordered Stop   12/09/20 1515  nirmatrelvir/ritonavir EUA (renal dosing) (PAXLOVID) 2 tablet  Status:  Discontinued        2 tablet Oral 2 times daily 12/09/20 1418 12/09/20 1428   12/05/20 1200  cephALEXin (KEFLEX) capsule 500 mg        500 mg Oral Every 6 hours 12/05/20 0828 12/10/20 0653   12/01/20 1300  cefTRIAXone (ROCEPHIN) 2 g in sodium chloride 0.9 % 100 mL IVPB  Status:  Discontinued        2 g 200 mL/hr over 30 Minutes Intravenous Every 24 hours 12/01/20 1200 12/05/20 0828       Subjective:  Patient seen and examined the bedside this morning.  Comfortable.  Hemodynamically stable.  On room air.  Complains of some cough, denies any shortness of breath   Objective: Vitals:   12/10/20 2020 12/11/20 0500 12/11/20 0621 12/11/20 0802  BP: 99/69  92/61 113/71  Pulse: 92  (!) 53 90  Resp: 20  15 18   Temp: (!) 97.5 F (36.4 C)  97.7 F (36.5 C) 97.7 F (36.5 C)  TempSrc: Axillary   Oral  SpO2: 98%  100% 94%  Weight:  76.8 kg    Height:        Intake/Output Summary (Last 24 hours) at 12/11/2020 0831 Last data filed at 12/11/2020 13/05/2020 Gross per 24 hour  Intake --  Output 500 ml  Net -500 ml   Filed Weights   12/09/20 0456 12/10/20 0500 12/11/20 0500  Weight: 75.7 kg 77.7 kg 76.8 kg    Examination:  General exam: Overall comfortable, not in distress,pleasant elderly female HEENT:  PERRL Respiratory system:  no wheezes or crackles  Cardiovascular system: S1 & S2 heard, RRR.  Gastrointestinal system: Abdomen is nondistended, soft and nontender. Central nervous system: Alert and oriented Extremities: No edema, no clubbing ,no cyanosis Skin: No rashes, no ulcers,no icterus        Data Reviewed: I have personally reviewed following labs and imaging studies  CBC: Recent Labs  Lab 12/05/20 0615 12/07/20 0709 12/09/20 0538  WBC 8.4 9.1 7.2  HGB 8.8* 9.4* 9.1*  HCT 28.5* 29.4* 29.3*  MCV 102.2* 103.5* 100.7*  PLT 123* 121* 141*   Basic Metabolic Panel: Recent Labs  Lab 12/06/20 1625 12/07/20 0709 12/09/20 0538  NA 139 136 139  K 4.0 3.4* 3.4*  CL 106 104 100  CO2 28 26 32  GLUCOSE 140* 112* 107*  BUN 27* 22 22  CREATININE 1.10* 1.11* 1.14*  CALCIUM 8.4* 8.2* 8.2*  MG  --   --  1.6*   GFR: Estimated Creatinine Clearance: 31.6 mL/min (A) (by C-G formula based on SCr of 1.14 mg/dL (H)). Liver Function Tests: No results for input(s): AST, ALT, ALKPHOS, BILITOT, PROT, ALBUMIN in the last 168 hours. No results for input(s):  LIPASE, AMYLASE in the last 168 hours. No results for input(s): AMMONIA in the last 168 hours. Coagulation Profile: No results for input(s): INR, PROTIME in the last 168 hours. Cardiac Enzymes: No results for input(s): CKTOTAL, CKMB, CKMBINDEX, TROPONINI in the last 168 hours. BNP (last 3 results) No results for input(s): PROBNP in the last 8760 hours. HbA1C: No results for input(s): HGBA1C in the last 72 hours. CBG: No results for input(s): GLUCAP in the last 168 hours. Lipid Profile: No results for input(s): CHOL, HDL, LDLCALC, TRIG, CHOLHDL, LDLDIRECT in the last 72 hours. Thyroid Function Tests: No results for input(s): TSH, T4TOTAL, FREET4, T3FREE, THYROIDAB in the last 72 hours. Anemia Panel: No results for input(s): VITAMINB12, FOLATE, FERRITIN, TIBC, IRON, RETICCTPCT in the last 72 hours. Sepsis Labs: No results for  input(s): PROCALCITON, LATICACIDVEN in the last 168 hours.  Recent Results (from the past 240 hour(s))  Resp Panel by RT-PCR (Flu A&B, Covid) Nasopharyngeal Swab     Status: Abnormal   Collection Time: 12/09/20  1:00 PM   Specimen: Nasopharyngeal Swab; Nasopharyngeal(NP) swabs in vial transport medium  Result Value Ref Range Status   SARS Coronavirus 2 by RT PCR POSITIVE (A) NEGATIVE Final    Comment: RESULT CALLED TO, READ BACK BY AND VERIFIED WITH: RACHEL CRESMAN, RN 1408 12/09/20 GM (NOTE) SARS-CoV-2 target nucleic acids are DETECTED.  The SARS-CoV-2 RNA is generally detectable in upper respiratory specimens during the acute phase of infection. Positive results are indicative of the presence of the identified virus, but do not rule out bacterial infection or co-infection with other pathogens not detected by the test. Clinical correlation with patient history and other diagnostic information is necessary to determine patient infection status. The expected result is Negative.  Fact Sheet for Patients: BloggerCourse.com  Fact Sheet for Healthcare Providers: SeriousBroker.it  This test is not yet approved or cleared by the [place] FDA and  has been authorized for detection and/or diagnosis of SARS-CoV-2 by FDA under an Emergency Use Authorization (EUA).  This EUA will remain in effect (meaning this test can b e used) for the duration of  the COVID-19 declaration under Section 564(b)(1) of the Act, 21 U.S.C. section 360bbb-3(b)(1), unless the authorization is terminated or revoked sooner.     Influenza A by PCR NEGATIVE NEGATIVE Final   Influenza B by PCR NEGATIVE NEGATIVE Final    Comment: (NOTE) The Xpert Xpress SARS-CoV-2/FLU/RSV plus assay is intended as an aid in the diagnosis of influenza from Nasopharyngeal swab specimens and should not be used as a sole basis for treatment. Nasal washings and aspirates are  unacceptable for Xpert Xpress SARS-CoV-2/FLU/RSV testing.  Fact Sheet for Patients: BloggerCourse.com  Fact Sheet for Healthcare Providers: SeriousBroker.it  This test is not yet approved or cleared by the [place] FDA and has been authorized for detection and/or diagnosis of SARS-CoV-2 by FDA under an Emergency Use Authorization (EUA). This EUA will remain in effect (meaning this test can be used) for the duration of the COVID-19 declaration under Section 564(b)(1) of the Act, 21 U.S.C. section 360bbb-3(b)(1), unless the authorization is terminated or revoked.  Performed at [facility], [address] [place], [place] [address]   MRSA Next Gen by PCR, Nasal     Status: None   Collection Time: 12/11/20  6:20 AM   Specimen: Nasal Mucosa; Nasal Swab  Result Value Ref Range Status   MRSA by PCR Next Gen NOT DETECTED NOT DETECTED Final    Comment: (NOTE) The  GeneXpert MRSA Assay (FDA approved for NASAL specimens only), is one component of a comprehensive MRSA colonization surveillance program. It is not intended to diagnose MRSA infection nor to guide or monitor treatment for MRSA infections. Test performance is not FDA approved in patients less than 24 years old. Performed at [facility], [address] [place], [place] [address]          Radiology Studies: No results found.      Scheduled Meds:  amiodarone  200 mg Oral BID   atorvastatin  10 mg Oral QHS   levothyroxine  125 mcg Oral QAC breakfast   loratadine  10 mg Oral Daily   metoprolol tartrate  12.5 mg Oral BID   multivitamin with minerals  1 tablet Oral Daily   pantoprazole  40 mg Oral BID   potassium chloride  40 mEq Oral Daily   sucralfate  1 g Oral TID WC & HS   Continuous Infusions:   LOS: 11 days    Time spent:25 mins, More than 50% of that time was spent in counseling and/or coordination of care.      Burnadette Pop, MD [facility] P11/05/2020, 8:31 AM

## 2020-12-11 NOTE — Progress Notes (Addendum)
Mobility Specialist - Progress Note   12/11/20 1300  Mobility  Activity Transferred:  Chair to bed  Level of Assistance Moderate assist, patient does 50-74%  Assistive Device Front wheel walker  Distance Ambulated (ft) 2 ft  Mobility Sit up in bed/chair position for meals  Mobility Response Tolerated well  Mobility performed by Mobility specialist  $Mobility charge 1 Mobility    Pt requesting return to bed on arrival. Utilizing RA. Pt voiced increased fatigue and UE weakness requiring +2 to stand, daughter in room kindly assisted with transfer. MinA once upright. Pt returned supine and repositioned HOB. VS checked---113 HR, 98% O2, 99/88 BP, with temperature of 97.5. "I feel tired just lifting my arms." Alarm set. RN notified.    Filiberto Pinks Mobility Specialist 12/11/20, 1:54 PM

## 2020-12-11 NOTE — Progress Notes (Signed)
PT Cancellation Note  Patient Details Name: Jane Campbell MRN: [id number] DOB: [birth year]   Cancelled Treatment:    Reason Eval/Treat Not Completed: Fatigue/lethargy limiting ability to participate. Mobility specialist exiting room before PT entering. Stating pt is hypotensive at this time and that pt reporting increased weakness and fatigue. PT will hold. RN notified by mobility specialist. Will re-attempt as available and medically appropriate.   Delphia Grates. Fairly IV, PT, DPT Physical Therapist- [place]  [facility]  12/11/2020, 1:49 PM

## 2020-12-11 NOTE — Progress Notes (Signed)
OT Cancellation Note  Patient Details Name: Jane Campbell MRN: [id number] DOB: 01-29-1927   Cancelled Treatment:    Reason Eval/Treat Not Completed: Fatigue/lethargy limiting ability to participate. Pt recently back to bed after working with mobility specialist where she was found to be hypotensive, reporting increased weakness and fatigue. OT will hold, RN aware. Will re-attempt at later date/time as appropriate.   Arman Filter., MPH, MS, OTR/L ascom [phone] 12/11/20, 2:09 PM

## 2020-12-12 DIAGNOSIS — K264 Chronic or unspecified duodenal ulcer with hemorrhage: Secondary | ICD-10-CM | POA: Diagnosis not present

## 2020-12-12 MED ORDER — HYDROCOD POLST-CPM POLST ER 10-8 MG/5ML PO SUER
5.0000 mL | Freq: Two times a day (BID) | ORAL | Status: DC
  Administered 2020-12-12 – 2020-12-21 (×19): 5 mL via ORAL
  Filled 2020-12-12 (×19): qty 5

## 2020-12-12 NOTE — Progress Notes (Signed)
PROGRESS NOTE    Solstice Lastinger  QIW:[id number] DOB: 04-21-1926 DOA: 11/30/2020 PCP: Pcp, No   Chief Complain:Abd pain  Brief Narrative: Jane Campbell is a 87 year old female with past medical history significant for paroxysmal atrial fibrillation, CAD, type 2 diabetes mellitus, essential hypertension, chronic lower extremity edema, hypothyroidism, recently started on anticoagulation with Eliquis 11/09/2020 who presented to [facility] ED from assisted living facility on 10/24 with complaints of crampy abdominal pain, rectal bleeding with melena/bright red blood.  Also reported weakness at home generalized fatigue and had to lower herself to the ground, no syncopal episode.  In the ED, afebrile, BP 120/90, HR 150, RR 22, SPO2 98% on room air.  WBC 12.9, hemoglobin 11.2 (Hgb 12.3 one month ago), platelets 195, sodium 137, potassium 3.3, chloride 98, CO2 31, BUN 56, creatinine 1.05.  INR 1.5, glucose 168.  FOBT positive.  Gastroenterology consulted.  TRH consulted for further evaluation and management of GI bleed. Underwent EGD on 12/02/2020 with findings of nonbleeding duodenal ulcer with nonbleeding visible vessel treated with epinephrine injection and bipolar cautery.  Eliquis was discontinued and she was started on Protonix, Carafate with plan for outpatient GI follow-up.  She was planned for discharge to skilled nursing facility but her COVID screen test came up to be incidentally positive so discharge has been delayed.  Case management following.  She is medically stable for discharge to SNF as soon as possible.  Assessment & Plan:   Principal Problem:   GI bleed Active Problems:   Atrial fibrillation with RVR (HCC)   Essential hypertension   Hyperlipidemia   CAD (coronary artery disease)   Edema   Diabetes (HCC)   Hypokalemia   Acute upper GI bleed 2/2 duodenal ulcer Patient presenting to ED with generalized weakness, fatigue, crampy abdominal pain and rectal bleeding.  Recently started on  anticoagulation with Eliquis on 11/09/2020.  FOBT positive.  Hemoglobin 11.2 on admission.  Underwent EGD on 12/02/2020 with findings of nonbleeding duodenal ulcer with nonbleeding visible vessel treated with epinephrine injection and bipolar cautery. -- Followed by gastroenterology.  Eliquis discontinued.  Cardiology aware. --Protonix 40mg  PO q12h x 8 weeks followed by once daily --Carafate 1 g p.o. TIDAC/HS x 1 month --soft diet --Outpatient follow-up with gastroenterology recommended   Paroxysmal atrial fibrillation with RVR Initially started on a diltiazem drip which was discontinued due to hypotension.  Seen by cardiology.  Currently rate controlled.  Asymptomatic. --Anticoagulation discontinued due to GI bleed as above --Amiodarone 200 mg BID --Metoprolol tartrate 75 mg p.o. twice daily --Outpatient follow-up with Dr. 1 week after discharge   Chronic diastolic congestive heart failure, decompensated TTE with LVEF 60 to 65%, no LV regional wall motion normalities, moderate LVH, LA moderately dilated, RA moderately dilated, moderate TR,/MR, IVC normal in size.  BNP elevated 300.  Chest x-ray had shown vascular congestion. -- Treated with IV Lasix.  Euvolemic now. -- She will go back on Lasix 40 mg daily along with potassium supplements.   Hypokalemia/Hypomagnesemia: Supplemented and corrected   Left lower extremity cellulitis -- Mostly chronic.  Local wound care and moisturizer.   Essential hypertension On metoprolol succinate 50 mg p.o. twice daily at home. --BP stable. --Increased Metoprolol to 75mg  BID, amiodarone 200 mg twice daily --Continue monitor BP closely in the setting of GI bleed   Hyperlipidemia: Atorvastatin 10 mg p.o. daily.  Resume.   Type 2 diabetes mellitus Diet controlled at home.  Hemoglobin A1c 5.7, well controlled. -- No indication to treat at this  time.   Hypothyroidism TSH 1.329, free T4 1.73, T3 51. --Levothyroxine 125 mcg p.o. daily    Anxiety: Alprazolam 0.25 mg p.o. 3 times daily as needed anxiety  Incidental COVID-positive: Complains of some cough but largely asymptomatic.  Denied any shortness of breath. Not hypoxic. No indication for treatment with remdesivir. Paxlovid contraindicated because she is taking amiodarone.  Continue cough medications         DVT prophylaxis:SCD Code Status: DNR Family Communication: None at bedside Status is: Inpatient  Remains inpatient appropriate because: Unsafe discharge      Consultants: GI  Procedures:EGD  Antimicrobials:  Anti-infectives (From admission, onward)    Start     Dose/Rate Route Frequency Ordered Stop   12/09/20 1515  nirmatrelvir/ritonavir EUA (renal dosing) (PAXLOVID) 2 tablet  Status:  Discontinued        2 tablet Oral 2 times daily 12/09/20 1418 12/09/20 1428   12/05/20 1200  cephALEXin (KEFLEX) capsule 500 mg        500 mg Oral Every 6 hours 12/05/20 0828 12/10/20 0653   12/01/20 1300  cefTRIAXone (ROCEPHIN) 2 g in sodium chloride 0.9 % 100 mL IVPB  Status:  Discontinued        2 g 200 mL/hr over 30 Minutes Intravenous Every 24 hours 12/01/20 1200 12/05/20 0828       Subjective:  Patient seen and examined at the bedside this morning.  Hemodynamically stable.  Sitting on the bed and was about to eat her breakfast.  Complains of dry cough.  Not on oxygen   Objective: Vitals:   12/12/20 0549 12/12/20 0551 12/12/20 0748 12/12/20 0800  BP: 98/67  101/67   Pulse: 92 92 (!) 128 (!) 109  Resp: 20  18   Temp: 97.6 F (36.4 C)  98.9 F (37.2 C)   TempSrc: Oral     SpO2: 95% 95% 97%   Weight:      Height:        Intake/Output Summary (Last 24 hours) at 12/12/2020 1133 Last data filed at 12/12/2020 0606 Gross per 24 hour  Intake --  Output 800 ml  Net -800 ml   Filed Weights   12/10/20 0500 12/11/20 0500 12/12/20 0500  Weight: 77.7 kg 76.8 kg 76.3 kg    Examination:  General exam: Overall comfortable, not in distress,pleasant  female HEENT: PERRL Respiratory system:  no wheezes or crackles  Cardiovascular system: S1 & S2 heard, RRR.  Gastrointestinal system: Abdomen is nondistended, soft and nontender. Central nervous system: Alert and oriented Extremities: No edema, no clubbing ,no cyanosis Skin: No rashes, no ulcers,no icterus        Data Reviewed: I have personally reviewed following labs and imaging studies  CBC: Recent Labs  Lab 12/07/20 0709 12/09/20 0538 12/11/20 0952  WBC 9.1 7.2 7.3  NEUTROABS  --   --  5.5  HGB 9.4* 9.1* 9.8*  HCT 29.4* 29.3* 32.6*  MCV 103.5* 100.7* 101.9*  PLT 121* 141* 191   Basic Metabolic Panel: Recent Labs  Lab 12/06/20 1625 12/07/20 0709 12/09/20 0538 12/11/20 0952  NA 139 136 139 139  K 4.0 3.4* 3.4* 3.8  CL 106 104 100 100  CO2 28 26 32 30  GLUCOSE 140* 112* 107* 101*  BUN 27* 22 22 24*  CREATININE 1.10* 1.11* 1.14* 1.19*  CALCIUM 8.4* 8.2* 8.2* 8.5*  MG  --   --  1.6* 1.9   GFR: Estimated Creatinine Clearance: 30.1 mL/min (A) (by C-G formula based on  SCr of 1.19 mg/dL (H)). Liver Function Tests: No results for input(s): AST, ALT, ALKPHOS, BILITOT, PROT, ALBUMIN in the last 168 hours. No results for input(s): LIPASE, AMYLASE in the last 168 hours. No results for input(s): AMMONIA in the last 168 hours. Coagulation Profile: No results for input(s): INR, PROTIME in the last 168 hours. Cardiac Enzymes: No results for input(s): CKTOTAL, CKMB, CKMBINDEX, TROPONINI in the last 168 hours. BNP (last 3 results) No results for input(s): PROBNP in the last 8760 hours. HbA1C: No results for input(s): HGBA1C in the last 72 hours. CBG: No results for input(s): GLUCAP in the last 168 hours. Lipid Profile: No results for input(s): CHOL, HDL, LDLCALC, TRIG, CHOLHDL, LDLDIRECT in the last 72 hours. Thyroid Function Tests: No results for input(s): TSH, T4TOTAL, FREET4, T3FREE, THYROIDAB in the last 72 hours. Anemia Panel: No results for input(s): VITAMINB12,  FOLATE, FERRITIN, TIBC, IRON, RETICCTPCT in the last 72 hours. Sepsis Labs: No results for input(s): PROCALCITON, LATICACIDVEN in the last 168 hours.  Recent Results (from the past 240 hour(s))  Resp Panel by RT-PCR (Flu A&B, Covid) Nasopharyngeal Swab     Status: Abnormal   Collection Time: 12/09/20  1:00 PM   Specimen: Nasopharyngeal Swab; Nasopharyngeal(NP) swabs in vial transport medium  Result Value Ref Range Status   SARS Coronavirus 2 by RT PCR POSITIVE (A) NEGATIVE Final    Comment: RESULT CALLED TO, READ BACK BY AND VERIFIED WITH: RACHEL CRESMAN, RN 1408 12/09/20 GM (NOTE) SARS-CoV-2 target nucleic acids are DETECTED.  The SARS-CoV-2 RNA is generally detectable in upper respiratory specimens during the acute phase of infection. Positive results are indicative of the presence of the identified virus, but do not rule out bacterial infection or co-infection with other pathogens not detected by the test. Clinical correlation with patient history and other diagnostic information is necessary to determine patient infection status. The expected result is Negative.  Fact Sheet for Patients: BloggerCourse.com  Fact Sheet for Healthcare Providers: SeriousBroker.it  This test is not yet approved or cleared by the [place] FDA and  has been authorized for detection and/or diagnosis of SARS-CoV-2 by FDA under an Emergency Use Authorization (EUA).  This EUA will remain in effect (meaning this test can b e used) for the duration of  the COVID-19 declaration under Section 564(b)(1) of the Act, 21 U.S.C. section 360bbb-3(b)(1), unless the authorization is terminated or revoked sooner.     Influenza A by PCR NEGATIVE NEGATIVE Final   Influenza B by PCR NEGATIVE NEGATIVE Final    Comment: (NOTE) The Xpert Xpress SARS-CoV-2/FLU/RSV plus assay is intended as an aid in the diagnosis of influenza from Nasopharyngeal swab specimens  and should not be used as a sole basis for treatment. Nasal washings and aspirates are unacceptable for Xpert Xpress SARS-CoV-2/FLU/RSV testing.  Fact Sheet for Patients: BloggerCourse.com  Fact Sheet for Healthcare Providers: SeriousBroker.it  This test is not yet approved or cleared by the [place] FDA and has been authorized for detection and/or diagnosis of SARS-CoV-2 by FDA under an Emergency Use Authorization (EUA). This EUA will remain in effect (meaning this test can be used) for the duration of the COVID-19 declaration under Section 564(b)(1) of the Act, 21 U.S.C. section 360bbb-3(b)(1), unless the authorization is terminated or revoked.  Performed at [facility], [address] [place], [place] [address]   MRSA Next Gen by PCR, Nasal     Status: None   Collection Time: 12/11/20  6:20 AM   Specimen: Nasal  Mucosa; Nasal Swab  Result Value Ref Range Status   MRSA by PCR Next Gen NOT DETECTED NOT DETECTED Final    Comment: (NOTE) The GeneXpert MRSA Assay (FDA approved for NASAL specimens only), is one component of a comprehensive MRSA colonization surveillance program. It is not intended to diagnose MRSA infection nor to guide or monitor treatment for MRSA infections. Test performance is not FDA approved in patients less than 73 years old. Performed at [facility], [address] [place], [place] [address]          Radiology Studies: No results found.      Scheduled Meds:  amiodarone  200 mg Oral BID   atorvastatin  10 mg Oral QHS   chlorpheniramine-HYDROcodone  5 mL Oral Q12H   levothyroxine  125 mcg Oral QAC breakfast   loratadine  10 mg Oral Daily   metoprolol tartrate  12.5 mg Oral BID   multivitamin with minerals  1 tablet Oral Daily   pantoprazole  40 mg Oral BID   potassium chloride  40 mEq Oral Daily   sucralfate  1 g Oral TID WC & HS   Continuous Infusions:    LOS: 12 days    Time spent:25 mins, More than 50% of that time was spent in counseling and/or coordination of care.      Burnadette Pop, MD [facility] P11/06/2020, 11:33 AM

## 2020-12-13 DIAGNOSIS — K264 Chronic or unspecified duodenal ulcer with hemorrhage: Secondary | ICD-10-CM | POA: Diagnosis not present

## 2020-12-13 NOTE — Progress Notes (Signed)
PROGRESS NOTE    Jane Campbell  XQJ:[id number] DOB: [birth year] DOA: 11/30/2020 PCP: Pcp, No   Chief Complain:Abd pain  Brief Narrative: Jane Campbell is a 87 year old female with past medical history significant for paroxysmal atrial fibrillation, CAD, type 2 diabetes mellitus, essential hypertension, chronic lower extremity edema, hypothyroidism, recently started on anticoagulation with Eliquis 11/09/2020 who presented to [facility] ED from assisted living facility on 10/24 with complaints of crampy abdominal pain, rectal bleeding with melena/bright red blood.  Also reported weakness at home generalized fatigue and had to lower herself to the ground, no syncopal episode.  In the ED, afebrile, BP 120/90, HR 150, RR 22, SPO2 98% on room air.  WBC 12.9, hemoglobin 11.2 (Hgb 12.3 one month ago), platelets 195, sodium 137, potassium 3.3, chloride 98, CO2 31, BUN 56, creatinine 1.05.  INR 1.5, glucose 168.  FOBT positive.  Gastroenterology consulted.  TRH consulted for further evaluation and management of GI bleed. Underwent EGD on 12/02/2020 with findings of nonbleeding duodenal ulcer with nonbleeding visible vessel treated with epinephrine injection and bipolar cautery.  Eliquis was discontinued and she was started on Protonix, Carafate with plan for outpatient GI follow-up.  She was planned for discharge to skilled nursing facility but her COVID screen test came up to be incidentally positive so discharge has been delayed.  Case management following.  She is medically stable for discharge to SNF as soon as possible.  Assessment & Plan:   Principal Problem:   GI bleed Active Problems:   Atrial fibrillation with RVR (HCC)   Essential hypertension   Hyperlipidemia   CAD (coronary artery disease)   Edema   Diabetes (HCC)   Hypokalemia   Acute upper GI bleed 2/2 duodenal ulcer Patient presenting to ED with generalized weakness, fatigue, crampy abdominal pain and rectal bleeding.  Recently started on  anticoagulation with Eliquis on 11/09/2020.  FOBT positive.  Hemoglobin 11.2 on admission.  Underwent EGD on 12/02/2020 with findings of nonbleeding duodenal ulcer with nonbleeding visible vessel treated with epinephrine injection and bipolar cautery. -- Followed by gastroenterology.  Eliquis discontinued.  Cardiology aware. --Protonix 40mg  PO q12h x 8 weeks followed by once daily --Carafate 1 g p.o. TIDAC/HS x 1 month --soft diet --Outpatient follow-up with gastroenterology recommended   Paroxysmal atrial fibrillation with RVR Initially started on a diltiazem drip which was discontinued due to hypotension.  Seen by cardiology.  Currently rate controlled.  Asymptomatic. --Anticoagulation discontinued due to GI bleed as above --Amiodarone 200 mg BID --Metoprolol tartrate 75 mg p.o. twice daily --Outpatient follow-up with Dr. 1 week after discharge   Chronic diastolic congestive heart failure, decompensated TTE with LVEF 60 to 65%, no LV regional wall motion normalities, moderate LVH, LA moderately dilated, RA moderately dilated, moderate TR,/MR, IVC normal in size.  BNP elevated 300.  Chest x-ray had shown vascular congestion. -- Treated with IV Lasix.  Euvolemic now. -- She will go back on Lasix 40 mg daily along with potassium supplements.   Hypokalemia/Hypomagnesemia: Supplemented and corrected   Left lower extremity cellulitis -- Mostly chronic.  Local wound care and moisturizer.   Essential hypertension On metoprolol succinate 50 mg p.o. twice daily at home. --BP stable. --Increased Metoprolol to 75mg  BID, amiodarone 200 mg twice daily --Continue monitor BP closely in the setting of GI bleed   Hyperlipidemia: Atorvastatin 10 mg p.o. daily.  Resume.   Type 2 diabetes mellitus Diet controlled at home.  Hemoglobin A1c 5.7, well controlled. -- No indication to treat at this  time.   Hypothyroidism TSH 1.329, free T4 1.73, T3 51. --Levothyroxine 125 mcg p.o. daily    Anxiety: Alprazolam 0.25 mg p.o. 3 times daily as needed anxiety  Incidental COVID-positive: Complains of some cough but largely asymptomatic.  Denied any shortness of breath. Not hypoxic. No indication for treatment with remdesivir. Paxlovid contraindicated because she is taking amiodarone.  Continue cough medications         DVT prophylaxis:SCD Code Status: DNR Family Communication: None at bedside Status is: Inpatient  Remains inpatient appropriate because: Unsafe discharge      Consultants: GI  Procedures:EGD  Antimicrobials:  Anti-infectives (From admission, onward)    Start     Dose/Rate Route Frequency Ordered Stop   12/09/20 1515  nirmatrelvir/ritonavir EUA (renal dosing) (PAXLOVID) 2 tablet  Status:  Discontinued        2 tablet Oral 2 times daily 12/09/20 1418 12/09/20 1428   12/05/20 1200  cephALEXin (KEFLEX) capsule 500 mg        500 mg Oral Every 6 hours 12/05/20 0828 12/10/20 0653   12/01/20 1300  cefTRIAXone (ROCEPHIN) 2 g in sodium chloride 0.9 % 100 mL IVPB  Status:  Discontinued        2 g 200 mL/hr over 30 Minutes Intravenous Every 24 hours 12/01/20 1200 12/05/20 0828       Subjective:  Patient seen and examined the bedside this morning. Hemodynamically stable.  No new complaints today.  She says she feels better  Objective: Vitals:   12/12/20 1908 12/12/20 2058 12/13/20 0500 12/13/20 0520  BP: 97/75 103/69  112/82  Pulse: (!) 103 (!) 107  99  Resp: 18 18  18   Temp:  98.5 F (36.9 C)  97.8 F (36.6 C)  TempSrc:  Oral    SpO2: 96% 97%  98%  Weight:   71.3 kg   Height:       No intake or output data in the 24 hours ending 12/13/20 0733  Filed Weights   12/11/20 0500 12/12/20 0500 12/13/20 0500  Weight: 76.8 kg 76.3 kg 71.3 kg    Examination:  General exam: Overall comfortable, not in distress, pleasant elderly female HEENT: PERRL Respiratory system:  no wheezes or crackles  Cardiovascular system: S1 & S2 heard, RRR.   Gastrointestinal system: Abdomen is nondistended, soft and nontender. Central nervous system: Alert and oriented Extremities: No edema, no clubbing ,no cyanosis Skin: No rashes, no ulcers,no icterus      Data Reviewed: I have personally reviewed following labs and imaging studies  CBC: Recent Labs  Lab 12/07/20 0709 12/09/20 0538 12/11/20 0952  WBC 9.1 7.2 7.3  NEUTROABS  --   --  5.5  HGB 9.4* 9.1* 9.8*  HCT 29.4* 29.3* 32.6*  MCV 103.5* 100.7* 101.9*  PLT 121* 141* 191   Basic Metabolic Panel: Recent Labs  Lab 12/06/20 1625 12/07/20 0709 12/09/20 0538 12/11/20 0952  NA 139 136 139 139  K 4.0 3.4* 3.4* 3.8  CL 106 104 100 100  CO2 28 26 32 30  GLUCOSE 140* 112* 107* 101*  BUN 27* 22 22 24*  CREATININE 1.10* 1.11* 1.14* 1.19*  CALCIUM 8.4* 8.2* 8.2* 8.5*  MG  --   --  1.6* 1.9   GFR: Estimated Creatinine Clearance: 29.2 mL/min (A) (by C-G formula based on SCr of 1.19 mg/dL (H)). Liver Function Tests: No results for input(s): AST, ALT, ALKPHOS, BILITOT, PROT, ALBUMIN in the last 168 hours. No results for input(s): LIPASE, AMYLASE in the  last 168 hours. No results for input(s): AMMONIA in the last 168 hours. Coagulation Profile: No results for input(s): INR, PROTIME in the last 168 hours. Cardiac Enzymes: No results for input(s): CKTOTAL, CKMB, CKMBINDEX, TROPONINI in the last 168 hours. BNP (last 3 results) No results for input(s): PROBNP in the last 8760 hours. HbA1C: No results for input(s): HGBA1C in the last 72 hours. CBG: No results for input(s): GLUCAP in the last 168 hours. Lipid Profile: No results for input(s): CHOL, HDL, LDLCALC, TRIG, CHOLHDL, LDLDIRECT in the last 72 hours. Thyroid Function Tests: No results for input(s): TSH, T4TOTAL, FREET4, T3FREE, THYROIDAB in the last 72 hours. Anemia Panel: No results for input(s): VITAMINB12, FOLATE, FERRITIN, TIBC, IRON, RETICCTPCT in the last 72 hours. Sepsis Labs: No results for input(s):  PROCALCITON, LATICACIDVEN in the last 168 hours.  Recent Results (from the past 240 hour(s))  Resp Panel by RT-PCR (Flu A&B, Covid) Nasopharyngeal Swab     Status: Abnormal   Collection Time: 12/09/20  1:00 PM   Specimen: Nasopharyngeal Swab; Nasopharyngeal(NP) swabs in vial transport medium  Result Value Ref Range Status   SARS Coronavirus 2 by RT PCR POSITIVE (A) NEGATIVE Final    Comment: RESULT CALLED TO, READ BACK BY AND VERIFIED WITH: RACHEL CRESMAN, RN 1408 12/09/20 GM (NOTE) SARS-CoV-2 target nucleic acids are DETECTED.  The SARS-CoV-2 RNA is generally detectable in upper respiratory specimens during the acute phase of infection. Positive results are indicative of the presence of the identified virus, but do not rule out bacterial infection or co-infection with other pathogens not detected by the test. Clinical correlation with patient history and other diagnostic information is necessary to determine patient infection status. The expected result is Negative.  Fact Sheet for Patients: BloggerCourse.com  Fact Sheet for Healthcare Providers: SeriousBroker.it  This test is not yet approved or cleared by the [place] FDA and  has been authorized for detection and/or diagnosis of SARS-CoV-2 by FDA under an Emergency Use Authorization (EUA).  This EUA will remain in effect (meaning this test can b e used) for the duration of  the COVID-19 declaration under Section 564(b)(1) of the Act, 21 U.S.C. section 360bbb-3(b)(1), unless the authorization is terminated or revoked sooner.     Influenza A by PCR NEGATIVE NEGATIVE Final   Influenza B by PCR NEGATIVE NEGATIVE Final    Comment: (NOTE) The Xpert Xpress SARS-CoV-2/FLU/RSV plus assay is intended as an aid in the diagnosis of influenza from Nasopharyngeal swab specimens and should not be used as a sole basis for treatment. Nasal washings and aspirates are unacceptable for  Xpert Xpress SARS-CoV-2/FLU/RSV testing.  Fact Sheet for Patients: BloggerCourse.com  Fact Sheet for Healthcare Providers: SeriousBroker.it  This test is not yet approved or cleared by the [place] FDA and has been authorized for detection and/or diagnosis of SARS-CoV-2 by FDA under an Emergency Use Authorization (EUA). This EUA will remain in effect (meaning this test can be used) for the duration of the COVID-19 declaration under Section 564(b)(1) of the Act, 21 U.S.C. section 360bbb-3(b)(1), unless the authorization is terminated or revoked.  Performed at [facility], [address] [place], [place] [address]   MRSA Next Gen by PCR, Nasal     Status: None   Collection Time: 12/11/20  6:20 AM   Specimen: Nasal Mucosa; Nasal Swab  Result Value Ref Range Status   MRSA by PCR Next Gen NOT DETECTED NOT DETECTED Final    Comment: (NOTE) The GeneXpert MRSA Assay (FDA  approved for NASAL specimens only), is one component of a comprehensive MRSA colonization surveillance program. It is not intended to diagnose MRSA infection nor to guide or monitor treatment for MRSA infections. Test performance is not FDA approved in patients less than 67 years old. Performed at [facility], [address], [place], [place] [address]          Radiology Studies: No results found.      Scheduled Meds:  amiodarone  200 mg Oral BID   atorvastatin  10 mg Oral QHS   chlorpheniramine-HYDROcodone  5 mL Oral Q12H   levothyroxine  125 mcg Oral QAC breakfast   loratadine  10 mg Oral Daily   metoprolol tartrate  12.5 mg Oral BID   multivitamin with minerals  1 tablet Oral Daily   pantoprazole  40 mg Oral BID   potassium chloride  40 mEq Oral Daily   sucralfate  1 g Oral TID WC & HS   Continuous Infusions:   LOS: 13 days    Time spent:15 mins, More than 50% of that time was spent in counseling and/or coordination  of care.      Burnadette Pop, MD [facility] P11/07/2020, 7:33 AM

## 2020-12-14 DIAGNOSIS — K264 Chronic or unspecified duodenal ulcer with hemorrhage: Secondary | ICD-10-CM | POA: Diagnosis not present

## 2020-12-14 NOTE — Progress Notes (Signed)
PROGRESS NOTE    Tenika Keeran  XQJ:[id number] DOB: [birth year] DOA: 11/30/2020 PCP: Pcp, No   Chief Complain:Abd pain  Brief Narrative: Jane Campbell is a 87 year old female with past medical history significant for paroxysmal atrial fibrillation, CAD, type 2 diabetes mellitus, essential hypertension, chronic lower extremity edema, hypothyroidism, recently started on anticoagulation with Eliquis 11/09/2020 who presented to [facility] ED from assisted living facility on 10/24 with complaints of crampy abdominal pain, rectal bleeding with melena/bright red blood.  Also reported weakness at home generalized fatigue and had to lower herself to the ground, no syncopal episode.  In the ED, afebrile, BP 120/90, HR 150, RR 22, SPO2 98% on room air.  WBC 12.9, hemoglobin 11.2 (Hgb 12.3 one month ago), platelets 195, sodium 137, potassium 3.3, chloride 98, CO2 31, BUN 56, creatinine 1.05.  INR 1.5, glucose 168.  FOBT positive.  Gastroenterology consulted.  TRH consulted for further evaluation and management of GI bleed. Underwent EGD on 12/02/2020 with findings of nonbleeding duodenal ulcer with nonbleeding visible vessel treated with epinephrine injection and bipolar cautery.  Eliquis was discontinued and she was started on Protonix, Carafate with plan for outpatient GI follow-up.  She was planned for discharge to skilled nursing facility but her COVID screen test came up to be incidentally positive so discharge has been delayed.  Case management following.  She is medically stable for discharge to SNF as soon as possible.  Assessment & Plan:   Principal Problem:   GI bleed Active Problems:   Atrial fibrillation with RVR (HCC)   Essential hypertension   Hyperlipidemia   CAD (coronary artery disease)   Edema   Diabetes (HCC)   Hypokalemia   Acute upper GI bleed 2/2 duodenal ulcer Patient presenting to ED with generalized weakness, fatigue, crampy abdominal pain and rectal bleeding.  Recently started on  anticoagulation with Eliquis on 11/09/2020.  FOBT positive.  Hemoglobin 11.2 on admission.  Underwent EGD on 12/02/2020 with findings of nonbleeding duodenal ulcer with nonbleeding visible vessel treated with epinephrine injection and bipolar cautery. -- Followed by gastroenterology.  Eliquis discontinued.  Cardiology aware. --Protonix 40mg  PO q12h x 8 weeks followed by once daily --Carafate 1 g p.o. TIDAC/HS x 1 month --soft diet --Outpatient follow-up with gastroenterology recommended   Paroxysmal atrial fibrillation with RVR Initially started on a diltiazem drip which was discontinued due to hypotension.  Seen by cardiology.  Currently rate controlled.  Asymptomatic. --Anticoagulation discontinued due to GI bleed as above --Amiodarone 200 mg BID --Metoprolol tartrate 75 mg p.o. twice daily --Outpatient follow-up with Dr. 1 week after discharge   Chronic diastolic congestive heart failure, decompensated TTE with LVEF 60 to 65%, no LV regional wall motion normalities, moderate LVH, LA moderately dilated, RA moderately dilated, moderate TR,/MR, IVC normal in size.  BNP elevated 300.  Chest x-ray had shown vascular congestion. -- Treated with IV Lasix.  Euvolemic now. -- She will go back on Lasix 40 mg daily along with potassium supplements.   Hypokalemia/Hypomagnesemia: Supplemented and corrected   Left lower extremity cellulitis -- Mostly chronic.  Local wound care and moisturizer.   Essential hypertension On metoprolol succinate 50 mg p.o. twice daily at home. --BP stable. --Increased Metoprolol to 75mg  BID, amiodarone 200 mg twice daily --Continue monitor BP closely in the setting of GI bleed   Hyperlipidemia: Atorvastatin 10 mg p.o. daily.  Resume.   Type 2 diabetes mellitus Diet controlled at home.  Hemoglobin A1c 5.7, well controlled. -- No indication to treat at this  time.   Hypothyroidism TSH 1.329, free T4 1.73, T3 51. --Levothyroxine 125 mcg p.o. daily    Anxiety: Alprazolam 0.25 mg p.o. 3 times daily as needed anxiety  Incidental COVID-positive: Complains of some cough but largely asymptomatic.  Denied any shortness of breath. Not hypoxic. No indication for treatment with remdesivir. Paxlovid contraindicated because she is taking amiodarone.  Continue cough medications         DVT prophylaxis:SCD Code Status: DNR Family Communication: None at bedside Status is: Inpatient  Remains inpatient appropriate because: Unsafe discharge      Consultants: GI  Procedures:EGD  Antimicrobials:  Anti-infectives (From admission, onward)    Start     Dose/Rate Route Frequency Ordered Stop   12/09/20 1515  nirmatrelvir/ritonavir EUA (renal dosing) (PAXLOVID) 2 tablet  Status:  Discontinued        2 tablet Oral 2 times daily 12/09/20 1418 12/09/20 1428   12/05/20 1200  cephALEXin (KEFLEX) capsule 500 mg        500 mg Oral Every 6 hours 12/05/20 0828 12/10/20 0653   12/01/20 1300  cefTRIAXone (ROCEPHIN) 2 g in sodium chloride 0.9 % 100 mL IVPB  Status:  Discontinued        2 g 200 mL/hr over 30 Minutes Intravenous Every 24 hours 12/01/20 1200 12/05/20 0828       Subjective:  Patient seen and examined the bedside this morning.  Hemodynamically stable.  Sitting on the chair.  On room air.  No new complaints  Objective: Vitals:   12/13/20 0915 12/13/20 2113 12/14/20 0358 12/14/20 0400  BP:  (!) 126/98  106/71  Pulse: (!) 109 (!) 108 (!) 119 83  Resp:  16 20   Temp:  98 F (36.7 C) 97.8 F (36.6 C)   TempSrc:  Oral Oral   SpO2:  97% 94%   Weight:      Height:        Intake/Output Summary (Last 24 hours) at 12/14/2020 0741 Last data filed at 12/13/2020 1626 Gross per 24 hour  Intake 450 ml  Output 0 ml  Net 450 ml    Filed Weights   12/11/20 0500 12/12/20 0500 12/13/20 0500  Weight: 76.8 kg 76.3 kg 71.3 kg    Examination:  General exam: Overall comfortable, not in distress,pleasant elderly female HEENT:  PERRL Respiratory system:  no wheezes or crackles  Cardiovascular system: S1 & S2 heard, RRR.  Gastrointestinal system: Abdomen is nondistended, soft and nontender. Central nervous system: Alert and oriented Extremities: No edema, no clubbing ,no cyanosis Skin: No rashes, no ulcers,no icterus        Data Reviewed: I have personally reviewed following labs and imaging studies  CBC: Recent Labs  Lab 12/09/20 0538 12/11/20 0952  WBC 7.2 7.3  NEUTROABS  --  5.5  HGB 9.1* 9.8*  HCT 29.3* 32.6*  MCV 100.7* 101.9*  PLT 141* 191   Basic Metabolic Panel: Recent Labs  Lab 12/09/20 0538 12/11/20 0952  NA 139 139  K 3.4* 3.8  CL 100 100  CO2 32 30  GLUCOSE 107* 101*  BUN 22 24*  CREATININE 1.14* 1.19*  CALCIUM 8.2* 8.5*  MG 1.6* 1.9   GFR: Estimated Creatinine Clearance: 28.6 mL/min (A) (by C-G formula based on SCr of 1.19 mg/dL (H)). Liver Function Tests: No results for input(s): AST, ALT, ALKPHOS, BILITOT, PROT, ALBUMIN in the last 168 hours. No results for input(s): LIPASE, AMYLASE in the last 168 hours. No results for input(s): AMMONIA in the  last 168 hours. Coagulation Profile: No results for input(s): INR, PROTIME in the last 168 hours. Cardiac Enzymes: No results for input(s): CKTOTAL, CKMB, CKMBINDEX, TROPONINI in the last 168 hours. BNP (last 3 results) No results for input(s): PROBNP in the last 8760 hours. HbA1C: No results for input(s): HGBA1C in the last 72 hours. CBG: No results for input(s): GLUCAP in the last 168 hours. Lipid Profile: No results for input(s): CHOL, HDL, LDLCALC, TRIG, CHOLHDL, LDLDIRECT in the last 72 hours. Thyroid Function Tests: No results for input(s): TSH, T4TOTAL, FREET4, T3FREE, THYROIDAB in the last 72 hours. Anemia Panel: No results for input(s): VITAMINB12, FOLATE, FERRITIN, TIBC, IRON, RETICCTPCT in the last 72 hours. Sepsis Labs: No results for input(s): PROCALCITON, LATICACIDVEN in the last 168 hours.  Recent Results  (from the past 240 hour(s))  Resp Panel by RT-PCR (Flu A&B, Covid) Nasopharyngeal Swab     Status: Abnormal   Collection Time: 12/09/20  1:00 PM   Specimen: Nasopharyngeal Swab; Nasopharyngeal(NP) swabs in vial transport medium  Result Value Ref Range Status   SARS Coronavirus 2 by RT PCR POSITIVE (A) NEGATIVE Final    Comment: RESULT CALLED TO, READ BACK BY AND VERIFIED WITH: RACHEL CRESMAN, RN 1408 12/09/20 GM (NOTE) SARS-CoV-2 target nucleic acids are DETECTED.  The SARS-CoV-2 RNA is generally detectable in upper respiratory specimens during the acute phase of infection. Positive results are indicative of the presence of the identified virus, but do not rule out bacterial infection or co-infection with other pathogens not detected by the test. Clinical correlation with patient history and other diagnostic information is necessary to determine patient infection status. The expected result is Negative.  Fact Sheet for Patients: BloggerCourse.com  Fact Sheet for Healthcare Providers: SeriousBroker.it  This test is not yet approved or cleared by the [place] FDA and  has been authorized for detection and/or diagnosis of SARS-CoV-2 by FDA under an Emergency Use Authorization (EUA).  This EUA will remain in effect (meaning this test can b e used) for the duration of  the COVID-19 declaration under Section 564(b)(1) of the Act, 21 U.S.C. section 360bbb-3(b)(1), unless the authorization is terminated or revoked sooner.     Influenza A by PCR NEGATIVE NEGATIVE Final   Influenza B by PCR NEGATIVE NEGATIVE Final    Comment: (NOTE) The Xpert Xpress SARS-CoV-2/FLU/RSV plus assay is intended as an aid in the diagnosis of influenza from Nasopharyngeal swab specimens and should not be used as a sole basis for treatment. Nasal washings and aspirates are unacceptable for Xpert Xpress SARS-CoV-2/FLU/RSV testing.  Fact Sheet for  Patients: BloggerCourse.com  Fact Sheet for Healthcare Providers: SeriousBroker.it  This test is not yet approved or cleared by the [place] FDA and has been authorized for detection and/or diagnosis of SARS-CoV-2 by FDA under an Emergency Use Authorization (EUA). This EUA will remain in effect (meaning this test can be used) for the duration of the COVID-19 declaration under Section 564(b)(1) of the Act, 21 U.S.C. section 360bbb-3(b)(1), unless the authorization is terminated or revoked.  Performed at [facility], [address] [place], [place] [address]   MRSA Next Gen by PCR, Nasal     Status: None   Collection Time: 12/11/20  6:20 AM   Specimen: Nasal Mucosa; Nasal Swab  Result Value Ref Range Status   MRSA by PCR Next Gen NOT DETECTED NOT DETECTED Final    Comment: (NOTE) The GeneXpert MRSA Assay (FDA approved for NASAL specimens only), is one component of a  comprehensive MRSA colonization surveillance program. It is not intended to diagnose MRSA infection nor to guide or monitor treatment for MRSA infections. Test performance is not FDA approved in patients less than 44 years old. Performed at [facility], [address], [place], [place] [address]          Radiology Studies: No results found.      Scheduled Meds:  amiodarone  200 mg Oral BID   atorvastatin  10 mg Oral QHS   chlorpheniramine-HYDROcodone  5 mL Oral Q12H   levothyroxine  125 mcg Oral QAC breakfast   loratadine  10 mg Oral Daily   metoprolol tartrate  12.5 mg Oral BID   multivitamin with minerals  1 tablet Oral Daily   pantoprazole  40 mg Oral BID   potassium chloride  40 mEq Oral Daily   sucralfate  1 g Oral TID WC & HS   Continuous Infusions:   LOS: 14 days    Time spent:15 mins, More than 50% of that time was spent in counseling and/or coordination of care.      Burnadette Pop, MD [facility] P11/08/2020, 7:41 AM

## 2020-12-14 NOTE — Progress Notes (Signed)
PT Cancellation Note  Patient Details Name: Jane Campbell MRN: [id number] DOB: [birth year]   Cancelled Treatment:    Reason Eval/Treat Not Completed: Fatigue/lethargy limiting ability to participate. Pt reporting she is too tired from OT to participate. PT reviewed LE therex given by OT prior to leaving room. Will re-attempt at later time/date as appropriate.   Delphia Grates. Fairly IV, PT, DPT Physical Therapist- [facility]  12/14/2020, 3:30 PM

## 2020-12-14 NOTE — Progress Notes (Signed)
Occupational Therapy Treatment Patient Details Name: Elenora Hawbaker MRN: [id number] DOB: 1926-06-14 Today's Date: 12/14/2020   History of present illness Pt is a 87 y.o. female presenting to hospital 10/24 with c/o nausea, crampy abdominal pain, and rectal bleeding; generalized weakness and had to lower self to ground.  Pt admitted with GI bleed, a-fib with RVR, hypotension, hypokalemia, and B LE edema.  S/p EGD 10/26 (found to have acute upper GI bleed 2/2 duodenal ulcer).  PMH includes a-fib, DM, htn, on Eliquis, large hiatal hernia.   OT comments  Pt seen for OT treatment on this date. Upon arrival to room, pt awake and seated upright in recliner with daughter present. Pt reporting no pain and agreeable to OT tx. Pt with improved activity tolerance this date; pt walked 8 ft with RW to sink with MIN GUARD and performed x2 standing grooming tasks with SUPERVISION before requesting seated rest break. Following seated rest break, pt performed sit>stand transfer with MOD A and walked 89ft to return to recliner with MIN GUARD. Pt is making good progress towards goals, however continues to require significant assistance for functional transfers and decreased activity tolerance compared to baseline. Of note, pt with resting HR 100s while seated in recliner, increasing to 110-130s with activity, and returning to 100s while seated in recliner at end of session; RN informed. While on RA, SpO2 97% following functional mobility of short household distances. Pt continues to benefit from skilled OT services to maximize return to PLOF and minimize risk of future falls, injury, caregiver burden, and readmission. Will continue to follow POC. Discharge recommendation remains appropriate.     Recommendations for follow up therapy are one component of a multi-disciplinary discharge planning process, led by the attending physician.  Recommendations may be updated based on patient status, additional functional criteria and  insurance authorization.    Follow Up Recommendations  Skilled nursing-short term rehab (<3 hours/day)    Assistance Recommended at Discharge Frequent or constant Supervision/Assistance  Equipment Recommendations  None recommended by OT       Precautions / Restrictions Precautions Precautions: Fall Precaution Comments: COVID + Restrictions Weight Bearing Restrictions: No       Mobility Bed Mobility               General bed mobility comments: not assessed, pt in recliner at beginning/end of session    Transfers Overall transfer level: Needs assistance Equipment used: Rolling walker (2 wheels) Transfers: Sit to/from Stand Sit to Stand: Mod assist           General transfer comment: x2 sit<>stand transfers, requires MOD A for upward momentum and steadying     Balance Overall balance assessment: Needs assistance Sitting-balance support: No upper extremity supported;Feet supported Sitting balance-Leahy Scale: Good Sitting balance - Comments: steady sitting reaching within BOS   Standing balance support: Bilateral upper extremity supported;During functional activity Standing balance-Leahy Scale: Fair Standing balance comment: With b/l forearm support from sink counter, pt requires MIN GUARD for standing grooming tasks                           ADL either performed or assessed with clinical judgement   ADL Overall ADL's : Needs assistance/impaired     Grooming: Wash/dry face;Oral care;Supervision/safety;Standing                               Functional mobility during ADLs: Min guard;Rolling  walker (2 wheels)      Extremity/Trunk Assessment Upper Extremity Assessment Upper Extremity Assessment: Generalized weakness   Lower Extremity Assessment Lower Extremity Assessment: Generalized weakness         Cognition Arousal/Alertness: Awake/alert Behavior During Therapy: WFL for tasks assessed/performed Overall Cognitive Status:  Within Functional Limits for tasks assessed                                            Exercises General Exercises - Upper Extremity Chair Push Up: Strengthening;Both;5 reps;Seated General Exercises - Lower Extremity Quad Sets: Strengthening;Both;10 reps;Seated Hip Flexion/Marching: AROM;Both;15 reps;Seated      General Comments Pt with resting HR 100s while seated in recliner, increasing to 110-130s with activity, and returning to 100s while seated in recliner at end of session. While on RA, SpO2 97% following functional mobility of short household distances. Pt denying dizziness throughout    Pertinent Vitals/ Pain       Pain Assessment: No/denies pain         Frequency  Min 1X/week        Progress Toward Goals  OT Goals(current goals can now be found in the care plan section)  Progress towards OT goals: Progressing toward goals  Acute Rehab OT Goals Patient Stated Goal: to "do whatever it takes" to return to PLOF OT Goal Formulation: With patient Time For Goal Achievement: 12/17/20 Potential to Achieve Goals: Good  Plan Discharge plan remains appropriate;Frequency remains appropriate       AM-PAC OT "6 Clicks" Daily Activity     Outcome Measure   Help from another person eating meals?: None Help from another person taking care of personal grooming?: A Little Help from another person toileting, which includes using toliet, bedpan, or urinal?: A Lot Help from another person bathing (including washing, rinsing, drying)?: A Lot Help from another person to put on and taking off regular upper body clothing?: A Little Help from another person to put on and taking off regular lower body clothing?: A Lot 6 Click Score: 16    End of Session Equipment Utilized During Treatment: Rolling walker (2 wheels)  OT Visit Diagnosis: Unsteadiness on feet (R26.81);Muscle weakness (generalized) (M62.81)   Activity Tolerance Patient tolerated treatment well    Patient Left in chair;with call bell/phone within reach;with chair alarm set;with family/visitor present   Nurse Communication Mobility status;Other (comment) (HR during functional mobility)        Time: 3536-1443 OT Time Calculation (min): 27 min  Charges: OT General Charges $OT Visit: 1 Visit OT Treatments $Self Care/Home Management : 8-22 mins $Therapeutic Activity: 8-22 mins  Matthew Folks, OTR/L [facility] [phone]

## 2020-12-15 DIAGNOSIS — K264 Chronic or unspecified duodenal ulcer with hemorrhage: Secondary | ICD-10-CM | POA: Diagnosis not present

## 2020-12-15 NOTE — Progress Notes (Signed)
   12/15/20 0936  Assess: MEWS Score  Temp 97.8 F (36.6 C)  BP 106/77  Pulse Rate (!) 126 (RN Kathlen Sakurai notified)  Resp 20  SpO2 97 %  O2 Device Room Air  Assess: MEWS Score  MEWS Temp 0  MEWS Systolic 0  MEWS Pulse 2  MEWS RR 0  MEWS LOC 0  MEWS Score 2  MEWS Score Color Yellow  Assess: if the MEWS score is Yellow or Red  Were vital signs taken at a resting state? Yes  Focused Assessment No change from prior assessment  Does the patient meet 2 or more of the SIRS criteria? No  Does the patient have a confirmed or suspected source of infection? Yes (Covid +)  Provider and Rapid Response Notified? Yes  MEWS guidelines implemented *See Row Information* Yes  Treat  MEWS Interventions Administered scheduled meds/treatments  Pain Scale 0-10  Pain Score 0  Escalate  MEWS: Escalate Yellow: discuss with charge nurse/RN and consider discussing with provider and RRT  Notify: Charge Nurse/RN  Name of Charge Nurse/RN Notified Alcario Drought RN  Date Charge Nurse/RN Notified 12/15/20  Time Charge Nurse/RN Notified 1020  Notify: Provider  Provider Name/Title Dr. Renford Dills  Date Provider Notified 12/15/20  Time Provider Notified 1020  Notification Type Call  Notification Reason Other (Comment) (Yellow MEWS)  Provider response No new orders  Date of Provider Response 12/15/20  Time of Provider Response 1020  Notify: Rapid Response  Name of Rapid Response RN Notified not applicable  Document  Patient Outcome Other (Comment) (pt is at baseline, on metoprolo which has been administered)  Progress note created (see row info) Yes  Assess: SIRS CRITERIA  SIRS Temperature  0  SIRS Pulse 1  SIRS Respirations  0  SIRS WBC 0  SIRS Score Sum  1  Patient assessed and needs addressed.  No distress, no pain. Will continue to monitor.

## 2020-12-15 NOTE — Progress Notes (Signed)
PROGRESS NOTE    Jane Campbell  DPO:[id number] DOB: September 20, 1926 DOA: 11/30/2020 PCP: Pcp, No   Chief Complain:Abd pain  Brief Narrative: Jane Campbell is a 87 year old female with past medical history significant for paroxysmal atrial fibrillation, CAD, type 2 diabetes mellitus, essential hypertension, chronic lower extremity edema, hypothyroidism, recently started on anticoagulation with Eliquis 11/09/2020 who presented to [facility] ED from assisted living facility on 10/24 with complaints of crampy abdominal pain, rectal bleeding with melena/bright red blood.  Also reported weakness at home generalized fatigue and had to lower herself to the ground, no syncopal episode.  In the ED, afebrile, BP 120/90, HR 150, RR 22, SPO2 98% on room air.  WBC 12.9, hemoglobin 11.2 (Hgb 12.3 one month ago), platelets 195, sodium 137, potassium 3.3, chloride 98, CO2 31, BUN 56, creatinine 1.05.  INR 1.5, glucose 168.  FOBT positive.  Gastroenterology consulted.  TRH consulted for further evaluation and management of GI bleed. Underwent EGD on 12/02/2020 with findings of nonbleeding duodenal ulcer with nonbleeding visible vessel treated with epinephrine injection and bipolar cautery.  Eliquis was discontinued and she was started on Protonix, Carafate with plan for outpatient GI follow-up.  She was planned for discharge to skilled nursing facility but her COVID screen test came up to be incidentally positive so discharge has been delayed.  Case management following.  She is medically stable for discharge to SNF as soon as possible.  Assessment & Plan:   Principal Problem:   GI bleed Active Problems:   Atrial fibrillation with RVR (HCC)   Essential hypertension   Hyperlipidemia   CAD (coronary artery disease)   Edema   Diabetes (HCC)   Hypokalemia   Acute upper GI bleed 2/2 duodenal ulcer Patient presenting to ED with generalized weakness, fatigue, crampy abdominal pain and rectal bleeding.  Recently started on  anticoagulation with Eliquis on 11/09/2020.  FOBT positive.  Hemoglobin 11.2 on admission.  Underwent EGD on 12/02/2020 with findings of nonbleeding duodenal ulcer with nonbleeding visible vessel treated with epinephrine injection and bipolar cautery. -- Followed by gastroenterology.  Eliquis discontinued.  Cardiology aware. --Protonix 40mg  PO q12h x 8 weeks followed by once daily --Carafate 1 g p.o. TIDAC/HS x 1 month --soft diet --Outpatient follow-up with gastroenterology recommended   Paroxysmal atrial fibrillation with RVR Initially started on a diltiazem drip which was discontinued due to hypotension.  Seen by cardiology.  Currently rate controlled.  Asymptomatic. --Anticoagulation discontinued due to GI bleed as above --Amiodarone 200 mg BID --Metoprolol tartrate 75 mg p.o. twice daily --Outpatient follow-up with Dr. 1 week after discharge   Chronic diastolic congestive heart failure, decompensated TTE with LVEF 60 to 65%, no LV regional wall motion normalities, moderate LVH, LA moderately dilated, RA moderately dilated, moderate TR,/MR, IVC normal in size.  BNP elevated 300.  Chest x-ray had shown vascular congestion. -- Treated with IV Lasix.  Euvolemic now. -- She will go back on Lasix 40 mg daily along with potassium supplements.   Hypokalemia/Hypomagnesemia: Supplemented and corrected   Left lower extremity cellulitis -- Mostly chronic.  Local wound care and moisturizer.   Essential hypertension On metoprolol succinate 50 mg p.o. twice daily at home. --BP stable. --Increased Metoprolol to 75mg  BID, amiodarone 200 mg twice daily --Continue monitor BP closely in the setting of GI bleed   Hyperlipidemia: Atorvastatin 10 mg p.o. daily.  Resume.   Type 2 diabetes mellitus Diet controlled at home.  Hemoglobin A1c 5.7, well controlled. -- No indication to treat at this  time.   Hypothyroidism TSH 1.329, free T4 1.73, T3 51. --Levothyroxine 125 mcg p.o. daily    Anxiety: Alprazolam 0.25 mg p.o. 3 times daily as needed anxiety  Incidental COVID-positive: Complains of some cough but largely asymptomatic.  Denied any shortness of breath. Not hypoxic. No indication for treatment with remdesivir. Paxlovid contraindicated because she is taking amiodarone.  Continue cough medications         DVT prophylaxis:SCD Code Status: DNR Family Communication: None at bedside Status is: Inpatient  Remains inpatient appropriate because: Unsafe discharge      Consultants: GI  Procedures:EGD  Antimicrobials:  Anti-infectives (From admission, onward)    Start     Dose/Rate Route Frequency Ordered Stop   12/09/20 1515  nirmatrelvir/ritonavir EUA (renal dosing) (PAXLOVID) 2 tablet  Status:  Discontinued        2 tablet Oral 2 times daily 12/09/20 1418 12/09/20 1428   12/05/20 1200  cephALEXin (KEFLEX) capsule 500 mg        500 mg Oral Every 6 hours 12/05/20 0828 12/10/20 0653   12/01/20 1300  cefTRIAXone (ROCEPHIN) 2 g in sodium chloride 0.9 % 100 mL IVPB  Status:  Discontinued        2 g 200 mL/hr over 30 Minutes Intravenous Every 24 hours 12/01/20 1200 12/05/20 0828       Subjective:  Patient seen and examined the bedside this morning.  Hemodynamically stable.  No new complaints today  Objective: Vitals:   12/14/20 0400 12/14/20 1559 12/14/20 2033 12/15/20 0602  BP: 106/71 128/76 121/74 104/74  Pulse: 83 (!) 103 99 (!) 103  Resp:  16 20 16   Temp:  98.1 F (36.7 C) 98.1 F (36.7 C) (!) 97.5 F (36.4 C)  TempSrc:   Oral Oral  SpO2:  96% 95% 93%  Weight:      Height:        Intake/Output Summary (Last 24 hours) at 12/15/2020 0737 Last data filed at 12/14/2020 1000 Gross per 24 hour  Intake 240 ml  Output --  Net 240 ml    Filed Weights   12/11/20 0500 12/12/20 0500 12/13/20 0500  Weight: 76.8 kg 76.3 kg 71.3 kg    Examination:  General exam: Overall comfortable, not in distress, pleasant elderly female, lying in bed HEENT:  PERRL Respiratory system:  no wheezes or crackles  Cardiovascular system: S1 & S2 heard, RRR.  Gastrointestinal system: Abdomen is nondistended, soft and nontender. Central nervous system: Alert and oriented Extremities: No edema, no clubbing ,no cyanosis Skin: No rashes, no ulcers,no icterus          Data Reviewed: I have personally reviewed following labs and imaging studies  CBC: Recent Labs  Lab 12/09/20 0538 12/11/20 0952  WBC 7.2 7.3  NEUTROABS  --  5.5  HGB 9.1* 9.8*  HCT 29.3* 32.6*  MCV 100.7* 101.9*  PLT 141* 191   Basic Metabolic Panel: Recent Labs  Lab 12/09/20 0538 12/11/20 0952  NA 139 139  K 3.4* 3.8  CL 100 100  CO2 32 30  GLUCOSE 107* 101*  BUN 22 24*  CREATININE 1.14* 1.19*  CALCIUM 8.2* 8.5*  MG 1.6* 1.9   GFR: Estimated Creatinine Clearance: 28.6 mL/min (A) (by C-G formula based on SCr of 1.19 mg/dL (H)). Liver Function Tests: No results for input(s): AST, ALT, ALKPHOS, BILITOT, PROT, ALBUMIN in the last 168 hours. No results for input(s): LIPASE, AMYLASE in the last 168 hours. No results for input(s): AMMONIA in the last  168 hours. Coagulation Profile: No results for input(s): INR, PROTIME in the last 168 hours. Cardiac Enzymes: No results for input(s): CKTOTAL, CKMB, CKMBINDEX, TROPONINI in the last 168 hours. BNP (last 3 results) No results for input(s): PROBNP in the last 8760 hours. HbA1C: No results for input(s): HGBA1C in the last 72 hours. CBG: No results for input(s): GLUCAP in the last 168 hours. Lipid Profile: No results for input(s): CHOL, HDL, LDLCALC, TRIG, CHOLHDL, LDLDIRECT in the last 72 hours. Thyroid Function Tests: No results for input(s): TSH, T4TOTAL, FREET4, T3FREE, THYROIDAB in the last 72 hours. Anemia Panel: No results for input(s): VITAMINB12, FOLATE, FERRITIN, TIBC, IRON, RETICCTPCT in the last 72 hours. Sepsis Labs: No results for input(s): PROCALCITON, LATICACIDVEN in the last 168 hours.  Recent Results  (from the past 240 hour(s))  Resp Panel by RT-PCR (Flu A&B, Covid) Nasopharyngeal Swab     Status: Abnormal   Collection Time: 12/09/20  1:00 PM   Specimen: Nasopharyngeal Swab; Nasopharyngeal(NP) swabs in vial transport medium  Result Value Ref Range Status   SARS Coronavirus 2 by RT PCR POSITIVE (A) NEGATIVE Final    Comment: RESULT CALLED TO, READ BACK BY AND VERIFIED WITH: RACHEL CRESMAN, RN 1408 12/09/20 GM (NOTE) SARS-CoV-2 target nucleic acids are DETECTED.  The SARS-CoV-2 RNA is generally detectable in upper respiratory specimens during the acute phase of infection. Positive results are indicative of the presence of the identified virus, but do not rule out bacterial infection or co-infection with other pathogens not detected by the test. Clinical correlation with patient history and other diagnostic information is necessary to determine patient infection status. The expected result is Negative.  Fact Sheet for Patients: BloggerCourse.com  Fact Sheet for Healthcare Providers: SeriousBroker.it  This test is not yet approved or cleared by the [place] FDA and  has been authorized for detection and/or diagnosis of SARS-CoV-2 by FDA under an Emergency Use Authorization (EUA).  This EUA will remain in effect (meaning this test can b e used) for the duration of  the COVID-19 declaration under Section 564(b)(1) of the Act, 21 U.S.C. section 360bbb-3(b)(1), unless the authorization is terminated or revoked sooner.     Influenza A by PCR NEGATIVE NEGATIVE Final   Influenza B by PCR NEGATIVE NEGATIVE Final    Comment: (NOTE) The Xpert Xpress SARS-CoV-2/FLU/RSV plus assay is intended as an aid in the diagnosis of influenza from Nasopharyngeal swab specimens and should not be used as a sole basis for treatment. Nasal washings and aspirates are unacceptable for Xpert Xpress SARS-CoV-2/FLU/RSV testing.  Fact Sheet for  Patients: BloggerCourse.com  Fact Sheet for Healthcare Providers: SeriousBroker.it  This test is not yet approved or cleared by the [place] FDA and has been authorized for detection and/or diagnosis of SARS-CoV-2 by FDA under an Emergency Use Authorization (EUA). This EUA will remain in effect (meaning this test can be used) for the duration of the COVID-19 declaration under Section 564(b)(1) of the Act, 21 U.S.C. section 360bbb-3(b)(1), unless the authorization is terminated or revoked.  Performed at [facility], [address] [place], [place] [address]   MRSA Next Gen by PCR, Nasal     Status: None   Collection Time: 12/11/20  6:20 AM   Specimen: Nasal Mucosa; Nasal Swab  Result Value Ref Range Status   MRSA by PCR Next Gen NOT DETECTED NOT DETECTED Final    Comment: (NOTE) The GeneXpert MRSA Assay (FDA approved for NASAL specimens only), is one component of a comprehensive  MRSA colonization surveillance program. It is not intended to diagnose MRSA infection nor to guide or monitor treatment for MRSA infections. Test performance is not FDA approved in patients less than 50 years old. Performed at [facility], [address] [place], [place] [address]          Radiology Studies: No results found.      Scheduled Meds:  amiodarone  200 mg Oral BID   atorvastatin  10 mg Oral QHS   chlorpheniramine-HYDROcodone  5 mL Oral Q12H   levothyroxine  125 mcg Oral QAC breakfast   loratadine  10 mg Oral Daily   metoprolol tartrate  12.5 mg Oral BID   multivitamin with minerals  1 tablet Oral Daily   pantoprazole  40 mg Oral BID   potassium chloride  40 mEq Oral Daily   sucralfate  1 g Oral TID WC & HS   Continuous Infusions:   LOS: 15 days    Time spent:15 mins, More than 50% of that time was spent in counseling and/or coordination of care.      Burnadette Pop, MD [facility] P11/09/2020, 7:37 AM

## 2020-12-16 ENCOUNTER — Inpatient Hospital Stay: Payer: Medicare Other

## 2020-12-16 DIAGNOSIS — K264 Chronic or unspecified duodenal ulcer with hemorrhage: Secondary | ICD-10-CM | POA: Diagnosis not present

## 2020-12-16 LAB — BASIC METABOLIC PANEL
Anion gap: 7 (ref 5–15)
BUN: 22 mg/dL (ref 8–23)
CO2: 27 mmol/L (ref 22–32)
Calcium: 8.5 mg/dL — ABNORMAL LOW (ref 8.9–10.3)
Chloride: 101 mmol/L (ref 98–111)
Creatinine, Ser: 1.35 mg/dL — ABNORMAL HIGH (ref 0.44–1.00)
GFR, Estimated: 37 mL/min — ABNORMAL LOW (ref >60)
Glucose, Bld: 90 mg/dL (ref 70–99)
Potassium: 4.6 mmol/L (ref 3.5–5.1)
Sodium: 135 mmol/L (ref 135–145)

## 2020-12-16 LAB — BRAIN NATRIURETIC PEPTIDE: B Natriuretic Peptide: 375.7 pg/mL — ABNORMAL HIGH (ref 0.0–100.0)

## 2020-12-16 MED ORDER — FUROSEMIDE 40 MG PO TABS
40.0000 mg | ORAL_TABLET | Freq: Every day | ORAL | Status: DC
  Administered 2020-12-17 – 2020-12-21 (×5): 40 mg via ORAL
  Filled 2020-12-16 (×5): qty 1

## 2020-12-16 MED ORDER — POTASSIUM CHLORIDE CRYS ER 20 MEQ PO TBCR
20.0000 meq | EXTENDED_RELEASE_TABLET | Freq: Every day | ORAL | Status: DC
  Administered 2020-12-17: 20 meq via ORAL
  Filled 2020-12-16: qty 1

## 2020-12-16 MED ORDER — FUROSEMIDE 10 MG/ML IJ SOLN
20.0000 mg | Freq: Once | INTRAMUSCULAR | Status: AC
  Administered 2020-12-16: 20 mg via INTRAVENOUS
  Filled 2020-12-16: qty 4

## 2020-12-16 MED ORDER — SODIUM CHLORIDE 0.9 % IV SOLN: INTRAVENOUS | Status: DC

## 2020-12-16 NOTE — Evaluation (Signed)
Occupational Therapy Re-evaluation Patient Details Name: Jane Campbell MRN: [id number] DOB: 11/18/1926 Today's Date: 12/16/2020   History of Present Illness Pt is a 87 y.o. female presenting to hospital 10/24 with c/o nausea, crampy abdominal pain, and rectal bleeding; generalized weakness and had to lower self to ground.  Pt admitted with GI bleed, a-fib with RVR, hypotension, hypokalemia, and B LE edema.  S/p EGD 10/26 (found to have acute upper GI bleed 2/2 duodenal ulcer).  PMH includes a-fib, DM, htn, on Eliquis, large hiatal hernia.   Clinical Impression   Pt seen for OT re-evaluation on this date in setting of prolonged hospitalization. Upon arrival to room, pt in bed and reporting that she just recently returned to bed with RN, however agreeable to OT re-eval/tx with MOD encouragement. Pt continues to present with decreased strength and activity tolerance compared to baseline; pt currently requires MOD A for sit>stand transfers from bed, MIN GUARD for functional mobility of short household distances with RW (20f, requiring x1 seated rest break), MIN GUARD for BSC transfers, and MIN GUARD for standing grooming tasks (able to perform for 1 min before requiring break). Pt is making good progress towards goals and 3/3 goals have been met and subsequently updated. Pt continues to benefit from skilled OT services to maximize return to PLOF and minimize risk of future falls, injury, caregiver burden, and readmission. Given pt's progress, discharge recommendation updated to home with HMarvin(TOC informed via secure chat).      Recommendations for follow up therapy are one component of a multi-disciplinary discharge planning process, led by the attending physician.  Recommendations may be updated based on patient status, additional functional criteria and insurance authorization.   Follow Up Recommendations  Home health OT    Assistance Recommended at Discharge Frequent or constant Supervision/Assistance   Functional Status Assessment  Patient has had a recent decline in their functional status and demonstrates the ability to make significant improvements in function in a reasonable and predictable amount of time.  Equipment Recommendations  None recommended by OT (pt has all necessary DME)       Precautions / Restrictions Precautions Precautions: Fall Precaution Comments: COVID + Restrictions Weight Bearing Restrictions: No      Mobility Bed Mobility Overal bed mobility: Modified Independent Bed Mobility: Supine to Sit;Sit to Supine     Supine to sit: Modified independent (Device/Increase time);HOB elevated Sit to supine: Min assist   General bed mobility comments: Requires MIN A for assisting LE back to bed    Transfers Overall transfer level: Needs assistance Equipment used: Rolling walker (2 wheels) Transfers: Sit to/from Stand Sit to Stand: Mod assist;Min guard;From elevated surface           General transfer comment: Requires MOD assist from bed, with rocking motion to gain momentum to stand. Requires only MIN GUARD from elevated surfaces (e.g., BSC). Requires verbal cues for safe hand placement with RW use      Balance Overall balance assessment: Needs assistance Sitting-balance support: No upper extremity supported;Feet supported Sitting balance-Leahy Scale: Good Sitting balance - Comments: steady sitting reaching outside BOS to adjust socks   Standing balance support: No upper extremity supported;During functional activity Standing balance-Leahy Scale: Fair Standing balance comment: Requires SUPERVISION for standing hand hygiene                           ADL either performed or assessed with clinical judgement   ADL Overall ADL's : Needs  assistance/impaired     Grooming: Wash/dry hands;Supervision/safety;Set up;Standing               Lower Body Dressing: Min guard;Sitting/lateral leans Lower Body Dressing Details (indicate cue type  and reason): to pull up socks while seated EOB Toilet Transfer: Min guard;Ambulation;BSC/3in1;Rolling walker (2 wheels) Toilet Transfer Details (indicate cue type and reason): Requires verbal cues for safe hand placement with RW use         Functional mobility during ADLs: Min guard;Rolling walker (2 wheels) (to walk 68f, requiring x1 seated rest break)       Vision Baseline Vision/History: 1 Wears glasses Patient Visual Report: No change from baseline              Pertinent Vitals/Pain Pain Assessment: No/denies pain        Extremity/Trunk Assessment Upper Extremity Assessment Upper Extremity Assessment: Generalized weakness   Lower Extremity Assessment Lower Extremity Assessment: Generalized weakness          Cognition Arousal/Alertness: Awake/alert Behavior During Therapy: WFL for tasks assessed/performed;Anxious Overall Cognitive Status: Within Functional Limits for tasks assessed                                       General Comments  Pt with resting HR 100s while seated in recliner, increasing to 110-120s with activity, and returning to 100s while seated in recliner at end of session.          OT Problem List: Decreased strength;Decreased activity tolerance;Impaired balance (sitting and/or standing)      OT Treatment/Interventions: Self-care/ADL training;Therapeutic exercise;Energy conservation;DME and/or AE instruction;Therapeutic activities;Patient/family education;Balance training    OT Goals(Current goals can be found in the care plan section) Acute Rehab OT Goals Patient Stated Goal: to "do whatever it takes" to return to PLOF OT Goal Formulation: With patient Time For Goal Achievement: 12/30/20 Potential to Achieve Goals: Good ADL Goals Pt Will Perform Grooming: with set-up;with supervision;standing (to >5 mins) Pt Will Perform Lower Body Dressing: with supervision;with set-up;sit to/from stand Pt Will Transfer to Toilet: with  supervision;ambulating;bedside commode  OT Frequency: Min 2X/week    AM-PAC OT "6 Clicks" Daily Activity     Outcome Measure Help from another person eating meals?: None Help from another person taking care of personal grooming?: A Little Help from another person toileting, which includes using toliet, bedpan, or urinal?: A Lot Help from another person bathing (including washing, rinsing, drying)?: A Lot Help from another person to put on and taking off regular upper body clothing?: None Help from another person to put on and taking off regular lower body clothing?: A Lot 6 Click Score: 17   End of Session Equipment Utilized During Treatment: Rolling walker (2 wheels) Nurse Communication: Mobility status;Other (comment) (HR during functional mobility)  Activity Tolerance: Patient tolerated treatment well Patient left: in bed;with call bell/phone within reach;with bed alarm set;with SCD's reapplied  OT Visit Diagnosis: Unsteadiness on feet (R26.81);Muscle weakness (generalized) (M62.81)                Time: 1535-1600 OT Time Calculation (min): 25 min Charges:  OT General Charges $OT Visit: 1 Visit OT Evaluation $OT Re-eval: 1 Re-eval OT Treatments $Self Care/Home Management : 8-22 mins  LFredirick Maudlin OTR/L [facility]

## 2020-12-16 NOTE — TOC Progression Note (Signed)
Transition of Care [facility]) - Progression Note    Patient Details  Name: Jane Campbell MRN: [id number] Date of Birth: 11/13/1926  [facility] CM/SW Contact  Chapman Fitch, RN Phone Number: 12/16/2020, 8:55 AM  Clinical Narrative:     Per [name] at [facility] patient will have to be no more than 1 person assist to stand, pivot, transfer.  PT and OT updated        Expected Discharge Plan and Services           Expected Discharge Date: 12/09/20                                     Social Determinants of Health (SDOH) Interventions    Readmission Risk Interventions No flowsheet data found.

## 2020-12-16 NOTE — Progress Notes (Signed)
Physical Therapy Treatment Patient Details Name: Jane Campbell MRN: [id number] DOB: 06/25/1926 Today's Date: 12/16/2020   History of Present Illness Pt is a 87 y.o. female presenting to hospital 10/24 with c/o nausea, crampy abdominal pain, and rectal bleeding; generalized weakness and had to lower self to ground.  Pt admitted with GI bleed, a-fib with RVR, hypotension, hypokalemia, and B LE edema.  S/p EGD 10/26 (found to have acute upper GI bleed 2/2 duodenal ulcer).  PMH includes a-fib, DM, htn, on Eliquis, large hiatal hernia.    PT Comments    Patient received in bed, she is eager to get up to recliner. Has difficult eating breakfast in bed. Patient generally anxious on arrival. Patient is mod independent with bed mobility. Transfers with mod assist from bed and recliner. She ambulated 25 feet in room with RW with min guard. Patient will continue to benefit from skilled PT while here to improve strength, activity tolerance and safety.     Recommendations for follow up therapy are one component of a multi-disciplinary discharge planning process, led by the attending physician.  Recommendations may be updated based on patient status, additional functional criteria and insurance authorization.  Follow Up Recommendations  Home health PT     Assistance Recommended at Discharge Intermittent Supervision/Assistance  Equipment Recommendations  BSC/3in1    Recommendations for Other Services       Precautions / Restrictions Precautions Precautions: Fall Precaution Comments: COVID + Restrictions Weight Bearing Restrictions: No     Mobility  Bed Mobility Overal bed mobility: Modified Independent Bed Mobility: Supine to Sit     Supine to sit: Modified independent (Device/Increase time)     General bed mobility comments: patient wants to get out of bed, had taken SCDs off and was partially to edge of bed on arrival    Transfers Overall transfer level: Needs assistance Equipment  used: Rolling walker (2 wheels) Transfers: Sit to/from Stand Sit to Stand: Mod assist           General transfer comment: mod assist from bed and from recliner. Uses rocking motion to gain momentum to stand    Ambulation/Gait Ambulation/Gait assistance: Min guard Gait Distance (Feet): 25 Feet Assistive device: Rolling walker (2 wheels) Gait Pattern/deviations: Step-through pattern;Decreased step length - right;Decreased step length - left;Trunk flexed Gait velocity: decreased     General Gait Details: patient ambulated to recliner and then ambulated to door and back to recliner min guard   Stairs             Wheelchair Mobility    Modified Rankin (Stroke Patients Only)       Balance Overall balance assessment: Needs assistance Sitting-balance support: Feet supported Sitting balance-Leahy Scale: Good Sitting balance - Comments: steady sitting reaching within BOS   Standing balance support: Bilateral upper extremity supported;During functional activity Standing balance-Leahy Scale: Fair Standing balance comment: B UE support with ambulation                            Cognition Arousal/Alertness: Awake/alert Behavior During Therapy: WFL for tasks assessed/performed;Anxious Overall Cognitive Status: Within Functional Limits for tasks assessed                                          Exercises      General Comments        Pertinent Vitals/Pain  Pain Assessment: No/denies pain    Home Living                          Prior Function            PT Goals (current goals can now be found in the care plan section) Acute Rehab PT Goals Patient Stated Goal: to improve overall strength and mobility PT Goal Formulation: With patient Time For Goal Achievement: 12/30/20 Potential to Achieve Goals: Good Progress towards PT goals: Progressing toward goals    Frequency    Min 2X/week      PT Plan Current plan  remains appropriate    Co-evaluation              AM-PAC PT "6 Clicks" Mobility   Outcome Measure  Help needed turning from your back to your side while in a flat bed without using bedrails?: None Help needed moving from lying on your back to sitting on the side of a flat bed without using bedrails?: A Little Help needed moving to and from a bed to a chair (including a wheelchair)?: A Little Help needed standing up from a chair using your arms (e.g., wheelchair or bedside chair)?: A Lot Help needed to walk in hospital room?: A Little Help needed climbing 3-5 steps with a railing? : Total 6 Click Score: 16    End of Session Equipment Utilized During Treatment: Gait belt Activity Tolerance: Patient tolerated treatment well Patient left: in chair;with call bell/phone within reach Nurse Communication: Mobility status PT Visit Diagnosis: Muscle weakness (generalized) (M62.81);Difficulty in walking, not elsewhere classified (R26.2)     Time: 2637-8588 PT Time Calculation (min) (ACUTE ONLY): 21 min  Charges:  $Gait Training: 8-22 mins                     Kwabena Strutz, PT, GCS 12/16/20,10:11 AM

## 2020-12-16 NOTE — Progress Notes (Signed)
PROGRESS NOTE    Amiera Gratz  TIR:[id number] DOB: 20-Jul-1926 DOA: 11/30/2020 PCP: Pcp, No   Chief Complain:Abd pain  Brief Narrative: Jane Campbell is a 87 year old female with past medical history significant for paroxysmal atrial fibrillation, CAD, type 2 diabetes mellitus, essential hypertension, chronic lower extremity edema, hypothyroidism, recently started on anticoagulation with Eliquis 11/09/2020 who presented to [facility] ED from assisted living facility on 10/24 with complaints of crampy abdominal pain, rectal bleeding with melena/bright red blood.  Also reported weakness at home generalized fatigue and had to lower herself to the ground, no syncopal episode. FOBT found to be positive.  Gastroenterology consulted. Underwent EGD on 12/02/2020 with findings of nonbleeding duodenal ulcer with nonbleeding visible vessel treated with epinephrine injection and bipolar cautery.  Eliquis was discontinued and she was started on Protonix, Carafate with plan for outpatient GI follow-up.  She was planned for discharge to skilled nursing facility but her COVID screen test came up to be incidentally positive so discharge has been delayed.  Case management following.  She is medically stable for discharge to SNF as soon as possible.  Assessment & Plan:   Principal Problem:   GI bleed Active Problems:   Atrial fibrillation with RVR (HCC)   Essential hypertension   Hyperlipidemia   CAD (coronary artery disease)   Edema   Diabetes (HCC)   Hypokalemia   Acute upper GI bleed 2/2 duodenal ulcer Patient presenting to ED with generalized weakness, fatigue, crampy abdominal pain and rectal bleeding.  Recently started on anticoagulation with Eliquis on 11/09/2020.  FOBT positive.  Hemoglobin 11.2 on admission.  Underwent EGD on 12/02/2020 with findings of nonbleeding duodenal ulcer with nonbleeding visible vessel treated with epinephrine injection and bipolar cautery. -- Followed by gastroenterology.  Eliquis  discontinued.  Cardiology aware. --Protonix 40mg  PO q12h x 8 weeks followed by once daily --Carafate 1 g p.o. TIDAC/HS x 1 month --Outpatient follow-up with gastroenterology recommended   Paroxysmal atrial fibrillation with RVR Initially started on a diltiazem drip which was discontinued due to hypotension.  Seen by cardiology.  Currently rate controlled.  Asymptomatic. --Anticoagulation discontinued due to GI bleed as above --Amiodarone 200 mg BID --Metoprolol tartrate 75 mg p.o. twice daily --Outpatient follow-up with Dr. [name] 1 week after discharge   Chronic diastolic congestive heart failure, decompensated TTE with LVEF 60 to 65%, no LV regional wall motion normalities, moderate LVH, LA moderately dilated, RA moderately dilated, moderate TR,/MR, IVC normal in size.   Chest x-ray had shown vascular congestion. -- Got intermittent doses of IV lasix -- She will continue  on Lasix 40 mg daily along with potassium supplements.  Mild AKI: Creatinine in the range of 1.3 today.  Could be associated with mild volume overload.  BNP elevated.  Given Lasix 20 mg IV once, check BMP tomorrow   Hypokalemia/Hypomagnesemia: Being monitored and supplemented as needed   Left lower extremity cellulitis -- Mostly chronic.  Local wound care and moisturizer.   Essential hypertension On metoprolol succinate 50 mg p.o. twice daily at home. --BP stable. --Increased Metoprolol to 75mg  BID, amiodarone 200 mg twice daily --Continue monitor BP closely in the setting of GI bleed   Hyperlipidemia: Atorvastatin 10 mg p.o. daily.  Resumed.   Type 2 diabetes mellitus Diet controlled at home.  Hemoglobin A1c 5.7, well controlled. -- No indication to treat at this time.   Hypothyroidism TSH 1.329, free T4 1.73, T3 51. --Levothyroxine 125 mcg p.o. daily   Anxiety: Alprazolam 0.25 mg p.o. 3 times daily  as needed anxiety  Incidental COVID-positive: Complains of some cough but largely asymptomatic.  Denied  any shortness of breath. Not hypoxic. No indication for treatment with remdesivir. Paxlovid contraindicated because she is taking amiodarone.  Continue cough medications  Thrombocytopenia: New problem, noticed today.  Patient is not on any blood thinners.  We will simply continue to monitor.  Check CBC tomorrow         DVT prophylaxis:SCD Code Status: DNR Family Communication: Called daughter on phone for update on 12/16/2020  Status is: Inpatient  Remains inpatient appropriate because: Unsafe discharge      Consultants: GI  Procedures:EGD  Antimicrobials:  Anti-infectives (From admission, onward)    Start     Dose/Rate Route Frequency Ordered Stop   12/09/20 1515  nirmatrelvir/ritonavir EUA (renal dosing) (PAXLOVID) 2 tablet  Status:  Discontinued        2 tablet Oral 2 times daily 12/09/20 1418 12/09/20 1428   12/05/20 1200  cephALEXin (KEFLEX) capsule 500 mg        500 mg Oral Every 6 hours 12/05/20 0828 12/10/20 0653   12/01/20 1300  cefTRIAXone (ROCEPHIN) 2 g in sodium chloride 0.9 % 100 mL IVPB  Status:  Discontinued        2 g 200 mL/hr over 30 Minutes Intravenous Every 24 hours 12/01/20 1200 12/05/20 0828       Subjective:  Patient seen and examined at the bedside this morning.  Hemodynamically stable during my evaluation.  She was sitting on the bed.  She denies any shortness of breath or  abdominal pain. She says she still has some cough  Objective: Vitals:   12/15/20 1400 12/15/20 1748 12/15/20 2017 12/16/20 0457  BP: 102/74 124/81 106/77 114/80  Pulse: 94 100 (!) 105 (!) 110  Resp: 20 20 18 18   Temp: 97.9 F (36.6 C) 97.9 F (36.6 C) 98.1 F (36.7 C) 98 F (36.7 C)  TempSrc: Oral Oral Oral Oral  SpO2: 93% 94% 94% 94%  Weight:      Height:        Intake/Output Summary (Last 24 hours) at 12/16/2020 0746 Last data filed at 12/15/2020 1548 Gross per 24 hour  Intake 120 ml  Output 300 ml  Net -180 ml    Filed Weights   12/11/20 0500 12/12/20  0500 12/13/20 0500  Weight: 76.8 kg 76.3 kg 71.3 kg    Examination:  General exam: Overall comfortable, not in distress, pleasant elderly female HEENT: PERRL Respiratory system: no wheezes or crackles  Cardiovascular system: S1 & S2 heard, RRR.  Gastrointestinal system: Abdomen is nondistended, soft and nontender. Central nervous system: Alert and oriented Extremities: No edema, no clubbing ,no cyanosis Skin: No rashes, no ulcers,no icterus         Data Reviewed: I have personally reviewed following labs and imaging studies  CBC: Recent Labs  Lab 12/11/20 0952  WBC 7.3  NEUTROABS 5.5  HGB 9.8*  HCT 32.6*  MCV 101.9*  PLT 191   Basic Metabolic Panel: Recent Labs  Lab 12/11/20 0952  NA 139  K 3.8  CL 100  CO2 30  GLUCOSE 101*  BUN 24*  CREATININE 1.19*  CALCIUM 8.5*  MG 1.9   GFR: Estimated Creatinine Clearance: 28.6 mL/min (A) (by C-G formula based on SCr of 1.19 mg/dL (H)). Liver Function Tests: No results for input(s): AST, ALT, ALKPHOS, BILITOT, PROT, ALBUMIN in the last 168 hours. No results for input(s): LIPASE, AMYLASE in the last 168 hours. No results  for input(s): AMMONIA in the last 168 hours. Coagulation Profile: No results for input(s): INR, PROTIME in the last 168 hours. Cardiac Enzymes: No results for input(s): CKTOTAL, CKMB, CKMBINDEX, TROPONINI in the last 168 hours. BNP (last 3 results) No results for input(s): PROBNP in the last 8760 hours. HbA1C: No results for input(s): HGBA1C in the last 72 hours. CBG: No results for input(s): GLUCAP in the last 168 hours. Lipid Profile: No results for input(s): CHOL, HDL, LDLCALC, TRIG, CHOLHDL, LDLDIRECT in the last 72 hours. Thyroid Function Tests: No results for input(s): TSH, T4TOTAL, FREET4, T3FREE, THYROIDAB in the last 72 hours. Anemia Panel: No results for input(s): VITAMINB12, FOLATE, FERRITIN, TIBC, IRON, RETICCTPCT in the last 72 hours. Sepsis Labs: No results for input(s): PROCALCITON,  LATICACIDVEN in the last 168 hours.  Recent Results (from the past 240 hour(s))  Resp Panel by RT-PCR (Flu A&B, Covid) Nasopharyngeal Swab     Status: Abnormal   Collection Time: 12/09/20  1:00 PM   Specimen: Nasopharyngeal Swab; Nasopharyngeal(NP) swabs in vial transport medium  Result Value Ref Range Status   SARS Coronavirus 2 by RT PCR POSITIVE (A) NEGATIVE Final    Comment: RESULT CALLED TO, READ BACK BY AND VERIFIED WITH: RACHEL CRESMAN, RN 1408 12/09/20 GM (NOTE) SARS-CoV-2 target nucleic acids are DETECTED.  The SARS-CoV-2 RNA is generally detectable in upper respiratory specimens during the acute phase of infection. Positive results are indicative of the presence of the identified virus, but do not rule out bacterial infection or co-infection with other pathogens not detected by the test. Clinical correlation with patient history and other diagnostic information is necessary to determine patient infection status. The expected result is Negative.  Fact Sheet for Patients: BloggerCourse.com  Fact Sheet for Healthcare Providers: SeriousBroker.it  This test is not yet approved or cleared by the [place] FDA and  has been authorized for detection and/or diagnosis of SARS-CoV-2 by FDA under an Emergency Use Authorization (EUA).  This EUA will remain in effect (meaning this test can b e used) for the duration of  the COVID-19 declaration under Section 564(b)(1) of the Act, 21 U.S.C. section 360bbb-3(b)(1), unless the authorization is terminated or revoked sooner.     Influenza A by PCR NEGATIVE NEGATIVE Final   Influenza B by PCR NEGATIVE NEGATIVE Final    Comment: (NOTE) The Xpert Xpress SARS-CoV-2/FLU/RSV plus assay is intended as an aid in the diagnosis of influenza from Nasopharyngeal swab specimens and should not be used as a sole basis for treatment. Nasal washings and aspirates are unacceptable for Xpert Xpress  SARS-CoV-2/FLU/RSV testing.  Fact Sheet for Patients: BloggerCourse.com  Fact Sheet for Healthcare Providers: SeriousBroker.it  This test is not yet approved or cleared by the [place] FDA and has been authorized for detection and/or diagnosis of SARS-CoV-2 by FDA under an Emergency Use Authorization (EUA). This EUA will remain in effect (meaning this test can be used) for the duration of the COVID-19 declaration under Section 564(b)(1) of the Act, 21 U.S.C. section 360bbb-3(b)(1), unless the authorization is terminated or revoked.  Performed at [facility], [address] [place], [place] [address]   MRSA Next Gen by PCR, Nasal     Status: None   Collection Time: 12/11/20  6:20 AM   Specimen: Nasal Mucosa; Nasal Swab  Result Value Ref Range Status   MRSA by PCR Next Gen NOT DETECTED NOT DETECTED Final    Comment: (NOTE) The GeneXpert MRSA Assay (FDA approved for NASAL specimens only),  is one component of a comprehensive MRSA colonization surveillance program. It is not intended to diagnose MRSA infection nor to guide or monitor treatment for MRSA infections. Test performance is not FDA approved in patients less than 39 years old. Performed at [facility], [address] [place], [place] [address]          Radiology Studies: No results found.      Scheduled Meds:  amiodarone  200 mg Oral BID   atorvastatin  10 mg Oral QHS   chlorpheniramine-HYDROcodone  5 mL Oral Q12H   levothyroxine  125 mcg Oral QAC breakfast   loratadine  10 mg Oral Daily   metoprolol tartrate  12.5 mg Oral BID   multivitamin with minerals  1 tablet Oral Daily   pantoprazole  40 mg Oral BID   potassium chloride  40 mEq Oral Daily   sucralfate  1 g Oral TID WC & HS   Continuous Infusions:   LOS: 16 days    Time spent:15 mins, More than 50% of that time was spent in counseling and/or coordination of  care.      Burnadette Pop, MD [facility] P11/10/2020, 7:46 AM

## 2020-12-17 ENCOUNTER — Encounter: Payer: Self-pay | Admitting: Internal Medicine

## 2020-12-17 DIAGNOSIS — K264 Chronic or unspecified duodenal ulcer with hemorrhage: Secondary | ICD-10-CM | POA: Diagnosis not present

## 2020-12-17 LAB — BASIC METABOLIC PANEL
Anion gap: 5 (ref 5–15)
BUN: 23 mg/dL (ref 8–23)
CO2: 28 mmol/L (ref 22–32)
Calcium: 8.6 mg/dL — ABNORMAL LOW (ref 8.9–10.3)
Chloride: 104 mmol/L (ref 98–111)
Creatinine, Ser: 1.22 mg/dL — ABNORMAL HIGH (ref 0.44–1.00)
GFR, Estimated: 42 mL/min — ABNORMAL LOW (ref >60)
Glucose, Bld: 104 mg/dL — ABNORMAL HIGH (ref 70–99)
Potassium: 4.2 mmol/L (ref 3.5–5.1)
Sodium: 137 mmol/L (ref 135–145)

## 2020-12-17 LAB — CBC WITH DIFFERENTIAL/PLATELET
Abs Immature Granulocytes: 0.03 10*3/uL (ref 0.00–0.07)
Abs Immature Granulocytes: 0.03 10*3/uL (ref 0.00–0.07)
Basophils Absolute: 0 10*3/uL (ref 0.0–0.1)
Basophils Absolute: 0 10*3/uL (ref 0.0–0.1)
Basophils Relative: 1 %
Basophils Relative: 0 %
Eosinophils Absolute: 0.1 10*3/uL (ref 0.0–0.5)
Eosinophils Absolute: 0.2 10*3/uL (ref 0.0–0.5)
Eosinophils Relative: 2 %
Eosinophils Relative: 2 %
HCT: 29.9 % — ABNORMAL LOW (ref 36.0–46.0)
HCT: 28.7 % — ABNORMAL LOW (ref 36.0–46.0)
Hemoglobin: 8.9 g/dL — ABNORMAL LOW (ref 12.0–15.0)
Hemoglobin: 8.9 g/dL — ABNORMAL LOW (ref 12.0–15.0)
Immature Granulocytes: 0 %
Immature Granulocytes: 0 %
Lymphocytes Relative: 18 %
Lymphocytes Relative: 19 %
Lymphs Abs: 1.2 10*3/uL (ref 0.7–4.0)
Lymphs Abs: 1.3 10*3/uL (ref 0.7–4.0)
MCH: 29.8 pg (ref 26.0–34.0)
MCH: 30.8 pg (ref 26.0–34.0)
MCHC: 29.8 g/dL — ABNORMAL LOW (ref 30.0–36.0)
MCHC: 31 g/dL (ref 30.0–36.0)
MCV: 100 fL (ref 80.0–100.0)
MCV: 99.3 fL (ref 80.0–100.0)
Monocytes Absolute: 0.4 10*3/uL (ref 0.1–1.0)
Monocytes Absolute: 0.5 10*3/uL (ref 0.1–1.0)
Monocytes Relative: 6 %
Monocytes Relative: 7 %
Neutro Abs: 5 10*3/uL (ref 1.7–7.7)
Neutro Abs: 5 10*3/uL (ref 1.7–7.7)
Neutrophils Relative %: 73 %
Neutrophils Relative %: 72 %
Platelets: 56 10*3/uL — ABNORMAL LOW (ref 150–400)
Platelets: 184 10*3/uL (ref 150–400)
RBC: 2.99 MIL/uL — ABNORMAL LOW (ref 3.87–5.11)
RBC: 2.89 MIL/uL — ABNORMAL LOW (ref 3.87–5.11)
RDW: 16.1 % — ABNORMAL HIGH (ref 11.5–15.5)
RDW: 15.9 % — ABNORMAL HIGH (ref 11.5–15.5)
WBC: 6.8 10*3/uL (ref 4.0–10.5)
WBC: 7.1 10*3/uL (ref 4.0–10.5)
nRBC: 0 % (ref 0.0–0.2)
nRBC: 0 % (ref 0.0–0.2)

## 2020-12-17 MED ORDER — FUROSEMIDE 10 MG/ML IJ SOLN
20.0000 mg | Freq: Once | INTRAMUSCULAR | Status: AC
  Administered 2020-12-17: 20 mg via INTRAVENOUS
  Filled 2020-12-17: qty 4

## 2020-12-17 NOTE — Progress Notes (Signed)
Progress Note    Jane Campbell  CLE:[id number] DOB: [birth year]  DOA: 11/30/2020 PCP: Pcp, No      Brief Narrative:    Medical records reviewed and are as summarized below:  Jane Campbell is a 87 y.o. female with past medical history significant for paroxysmal atrial fibrillation, CAD, type 2 diabetes mellitus, essential hypertension, chronic lower extremity edema, hypothyroidism, recently started on anticoagulation with Eliquis 11/09/2020 who presented to [facility] ED from assisted living facility on 10/24 with complaints of generalized weakness, fatigue, crampy abdominal pain, rectal bleeding with melena/bright red blood.  FOBT was positive.  Eliquis was discontinued and she was treated with IV Protonix.  She underwent EGD on 12/02/2020 which showed nonbleeding duodenal ulcer with nonbleeding visible vessel that was treated with epinephrine injection and bipolar cautery.  Discharge to SNF was delayed because screening COVID test came back positive.    Assessment/Plan:   Principal Problem:   GI bleed Active Problems:   Atrial fibrillation with RVR (HCC)   Essential hypertension   Hyperlipidemia   CAD (coronary artery disease)   Edema   Diabetes (HCC)   Hypokalemia    Body mass index is 27.84 kg/m.  Acute upper GI bleeding secondary to duodenal ulcer: S/p EGD on 12/03/2018 which showed nonbleeding duodenal ulcer with nonbleeding visible vessel that was treated with epinephrine injection and cautery.  Continue Protonix twice daily for 8 weeks and daily thereafter.    Paroxysmal atrial fibrillation with RVR: Continue amiodarone and metoprolol.  Outpatient follow-up with Dr. Liborio Nixon 1 week after discharge.  Eliquis discontinued on admission because of GI bleed.  Acute on chronic diastolic CHF: Continue IV Lasix.  Monitor BMP, daily weight and urine output.  2D echo September 2022 showed normal EF, moderate TR, moderate MR.  Consider repeat chest x-ray tomorrow.  Mild AKI: Improving.   Monitor BMP.  Thrombocytopenia, hypokalemia, hypomagnesemia: Improved  COVID-19 infection: She is tolerating room air.  Other comorbidities include type II DM, hypothyroidism, hyperlipidemia, hypertension  Diet Order             DIET DYS 3 Room service appropriate? Yes; Fluid consistency: Thin  Diet effective now           Diet - low sodium heart healthy                      Consultants: Gastroenterologist  Procedures: EGD    Medications:    amiodarone  200 mg Oral BID   atorvastatin  10 mg Oral QHS   chlorpheniramine-HYDROcodone  5 mL Oral Q12H   furosemide  40 mg Oral Daily   levothyroxine  125 mcg Oral QAC breakfast   loratadine  10 mg Oral Daily   metoprolol tartrate  12.5 mg Oral BID   multivitamin with minerals  1 tablet Oral Daily   pantoprazole  40 mg Oral BID   potassium chloride  20 mEq Oral Daily   sucralfate  1 g Oral TID WC & HS   Continuous Infusions:   Anti-infectives (From admission, onward)    Start     Dose/Rate Route Frequency Ordered Stop   12/09/20 1515  nirmatrelvir/ritonavir EUA (renal dosing) (PAXLOVID) 2 tablet  Status:  Discontinued        2 tablet Oral 2 times daily 12/09/20 1418 12/09/20 1428   12/05/20 1200  cephALEXin (KEFLEX) capsule 500 mg        500 mg Oral Every 6 hours 12/05/20 0828 12/10/20 9449  12/01/20 1300  cefTRIAXone (ROCEPHIN) 2 g in sodium chloride 0.9 % 100 mL IVPB  Status:  Discontinued        2 g 200 mL/hr over 30 Minutes Intravenous Every 24 hours 12/01/20 1200 12/05/20 0828              Family Communication/Anticipated D/C date and plan/Code Status   DVT prophylaxis: SCDs Start: 12/01/20 0402     Code Status: DNR  Family Communication: None Disposition Plan: Possible discharge to home versus SNF   Status is: Inpatient  Remains inpatient appropriate because: IV Lasix           Subjective:   C/o cough mild shortness of breath.  She said her legs were very swollen but she  thinks the swelling is coming down  Objective:    Vitals:   12/16/20 2033 12/17/20 0535 12/17/20 0756 12/17/20 1416  BP: 122/76 113/70 106/73   Pulse: 98 (!) 107 (!) 102   Resp: 18 16 18    Temp: 97.9 F (36.6 C) 97.8 F (36.6 C) 97.8 F (36.6 C)   TempSrc: Oral Oral Oral   SpO2: 96% 95% 94% 100%  Weight:      Height:       No data found.   Intake/Output Summary (Last 24 hours) at 12/17/2020 1505 Last data filed at 12/17/2020 0602 Gross per 24 hour  Intake 240 ml  Output 350 ml  Net -110 ml   Filed Weights   12/11/20 0500 12/12/20 0500 12/13/20 0500  Weight: 76.8 kg 76.3 kg 71.3 kg    Exam:  GEN: NAD SKIN: Warm and dry EYES: EOMI ENT: MMM CV: RRR PULM: No wheezing or rales heard ABD: soft, ND, NT, +BS CNS: AAO x 3, non focal EXT: No edema or tenderness        Data Reviewed:   I have personally reviewed following labs and imaging studies:  Labs: Labs show the following:   Basic Metabolic Panel: Recent Labs  Lab 12/11/20 0952 12/16/20 0810 12/17/20 0633  NA 139 135 137  K 3.8 4.6 4.2  CL 100 101 104  CO2 30 27 28   GLUCOSE 101* 90 104*  BUN 24* 22 23  CREATININE 1.19* 1.35* 1.22*  CALCIUM 8.5* 8.5* 8.6*  MG 1.9  --   --    GFR Estimated Creatinine Clearance: 27.9 mL/min (A) (by C-G formula based on SCr of 1.22 mg/dL (H)). Liver Function Tests: No results for input(s): AST, ALT, ALKPHOS, BILITOT, PROT, ALBUMIN in the last 168 hours. No results for input(s): LIPASE, AMYLASE in the last 168 hours. No results for input(s): AMMONIA in the last 168 hours. Coagulation profile No results for input(s): INR, PROTIME in the last 168 hours.  CBC: Recent Labs  Lab 12/11/20 0952 12/16/20 0810 12/17/20 0633  WBC 7.3 6.8 7.1  NEUTROABS 5.5 5.0 5.0  HGB 9.8* 8.9* 8.9*  HCT 32.6* 29.9* 28.7*  MCV 101.9* 100.0 99.3  PLT 191 56* 184   Cardiac Enzymes: No results for input(s): CKTOTAL, CKMB, CKMBINDEX, TROPONINI in the last 168 hours. BNP (last  3 results) No results for input(s): PROBNP in the last 8760 hours. CBG: No results for input(s): GLUCAP in the last 168 hours. D-Dimer: No results for input(s): DDIMER in the last 72 hours. Hgb A1c: No results for input(s): HGBA1C in the last 72 hours. Lipid Profile: No results for input(s): CHOL, HDL, LDLCALC, TRIG, CHOLHDL, LDLDIRECT in the last 72 hours. Thyroid function studies: No results for input(s):  TSH, T4TOTAL, T3FREE, THYROIDAB in the last 72 hours.  Invalid input(s): FREET3 Anemia work up: No results for input(s): VITAMINB12, FOLATE, FERRITIN, TIBC, IRON, RETICCTPCT in the last 72 hours. Sepsis Labs: Recent Labs  Lab 12/11/20 0952 12/16/20 0810 12/17/20 0633  WBC 7.3 6.8 7.1    Microbiology Recent Results (from the past 240 hour(s))  Resp Panel by RT-PCR (Flu A&B, Covid) Nasopharyngeal Swab     Status: Abnormal   Collection Time: 12/09/20  1:00 PM   Specimen: Nasopharyngeal Swab; Nasopharyngeal(NP) swabs in vial transport medium  Result Value Ref Range Status   SARS Coronavirus 2 by RT PCR POSITIVE (A) NEGATIVE Final    Comment: RESULT CALLED TO, READ BACK BY AND VERIFIED WITH: RACHEL CRESMAN, RN 1408 12/09/20 GM (NOTE) SARS-CoV-2 target nucleic acids are DETECTED.  The SARS-CoV-2 RNA is generally detectable in upper respiratory specimens during the acute phase of infection. Positive results are indicative of the presence of the identified virus, but do not rule out bacterial infection or co-infection with other pathogens not detected by the test. Clinical correlation with patient history and other diagnostic information is necessary to determine patient infection status. The expected result is Negative.  Fact Sheet for Patients: BloggerCourse.com  Fact Sheet for Healthcare Providers: SeriousBroker.it  This test is not yet approved or cleared by the [place] FDA and  has been authorized for detection  and/or diagnosis of SARS-CoV-2 by FDA under an Emergency Use Authorization (EUA).  This EUA will remain in effect (meaning this test can b e used) for the duration of  the COVID-19 declaration under Section 564(b)(1) of the Act, 21 U.S.C. section 360bbb-3(b)(1), unless the authorization is terminated or revoked sooner.     Influenza A by PCR NEGATIVE NEGATIVE Final   Influenza B by PCR NEGATIVE NEGATIVE Final    Comment: (NOTE) The Xpert Xpress SARS-CoV-2/FLU/RSV plus assay is intended as an aid in the diagnosis of influenza from Nasopharyngeal swab specimens and should not be used as a sole basis for treatment. Nasal washings and aspirates are unacceptable for Xpert Xpress SARS-CoV-2/FLU/RSV testing.  Fact Sheet for Patients: BloggerCourse.com  Fact Sheet for Healthcare Providers: SeriousBroker.it  This test is not yet approved or cleared by the [place] FDA and has been authorized for detection and/or diagnosis of SARS-CoV-2 by FDA under an Emergency Use Authorization (EUA). This EUA will remain in effect (meaning this test can be used) for the duration of the COVID-19 declaration under Section 564(b)(1) of the Act, 21 U.S.C. section 360bbb-3(b)(1), unless the authorization is terminated or revoked.  Performed at [facility], [address] [place], [place] [address]   MRSA Next Gen by PCR, Nasal     Status: None   Collection Time: 12/11/20  6:20 AM   Specimen: Nasal Mucosa; Nasal Swab  Result Value Ref Range Status   MRSA by PCR Next Gen NOT DETECTED NOT DETECTED Final    Comment: (NOTE) The GeneXpert MRSA Assay (FDA approved for NASAL specimens only), is one component of a comprehensive MRSA colonization surveillance program. It is not intended to diagnose MRSA infection nor to guide or monitor treatment for MRSA infections. Test performance is not FDA approved in patients less than 27  years old. Performed at [facility], [address] [place], [place] [address]     Procedures and diagnostic studies:  DG Chest [facility] 1 View  Result Date: 12/16/2020 CLINICAL DATA:  Atrial fibrillation, edema in lower extremities, difficulty breathing EXAM: PORTABLE CHEST 1  VIEW COMPARISON:  12/06/2020 FINDINGS: Transverse diameter of heart is increased. Central pulmonary vessels are prominent. There is increased density in right mid and both lower lung fields. There is blunting of lateral CP angles. There is possible increase in amount of right pleural effusion. There is no pneumothorax. IMPRESSION: Cardiomegaly. Central pulmonary vessels are prominent suggesting CHF. Small to moderate bilateral pleural effusions, more so on the right side with interval increase. Possibility of underlying atelectasis or pneumonia in the lower lung fields is not excluded. Electronically Signed   By: Ernie Avena M.D.   On: 12/16/2020 10:02               LOS: 17 days   Hayven Croy  [facility]   Pager on [email or web address]. If 7PM-7AM, please contact night-coverage at [email or web address]     12/17/2020, 3:05 PM

## 2020-12-17 NOTE — Progress Notes (Signed)
Physical Therapy Treatment Patient Details Name: Jane Campbell MRN: [id number] DOB: [birth year] Today's Date: 12/17/2020   History of Present Illness Pt is a 87 y.o. female presenting to hospital 10/24 with c/o nausea, crampy abdominal pain, and rectal bleeding; generalized weakness and had to lower self to ground.  Pt admitted with GI bleed, a-fib with RVR, hypotension, hypokalemia, and B LE edema.  S/p EGD 10/26 (found to have acute upper GI bleed 2/2 duodenal ulcer).  PMH includes a-fib, DM, htn, on Eliquis, large hiatal hernia.    PT Comments    Patient received in recliner, daughter in room. She reports she is depressed. Little motivation to do anything, but agrees to walk. Patient requires min assist for sit to stand from recliner x2. Min guard for ambulation with RW. Mod independence to get from sit to supine. She is making progress toward goals, but limited by fatigue. She will continue to benefit from skilled PT while here to improve strength and functional independence.        Recommendations for follow up therapy are one component of a multi-disciplinary discharge planning process, led by the attending physician.  Recommendations may be updated based on patient status, additional functional criteria and insurance authorization.  Follow Up Recommendations  Home health PT     Assistance Recommended at Discharge    Equipment Recommendations  BSC/3in1    Recommendations for Other Services       Precautions / Restrictions Precautions Precautions: Fall Precaution Comments: COVID + Restrictions Weight Bearing Restrictions: No     Mobility  Bed Mobility Overal bed mobility: Modified Independent Bed Mobility: Sit to Supine       Sit to supine: Modified independent (Device/Increase time)   General bed mobility comments: She was able to bring LEs back up into bed independently. Needed assist to scoot up in bed.    Transfers Overall transfer level: Needs  assistance Equipment used: Rolling walker (2 wheels)   Sit to Stand: Min assist           General transfer comment: Min assist this date for STS transfer from recliner x 2    Ambulation/Gait Ambulation/Gait assistance: Min guard Gait Distance (Feet): 35 Feet Assistive device: Rolling walker (2 wheels) Gait Pattern/deviations: Step-through pattern;Decreased step length - right;Decreased step length - left;Trunk flexed Gait velocity: decreased     General Gait Details: Min guard for ambulation with RW. Patient fatigues easily and requires seated rest after 25 feet with HR up to 145. She attempted to go another lap to door but was unable and turned around after 5 feet.   Stairs             Wheelchair Mobility    Modified Rankin (Stroke Patients Only)       Balance Overall balance assessment: Needs assistance Sitting-balance support: Feet supported Sitting balance-Leahy Scale: Good     Standing balance support: Bilateral upper extremity supported;During functional activity Standing balance-Leahy Scale: Fair Standing balance comment: Min guard for ambulation with RW                            Cognition Arousal/Alertness: Awake/alert Behavior During Therapy: WFL for tasks assessed/performed Overall Cognitive Status: Within Functional Limits for tasks assessed                                 General Comments: Patient reports she is depressed. Limited  motivation to participate.        Exercises      General Comments        Pertinent Vitals/Pain Pain Assessment: No/denies pain    Home Living                          Prior Function            PT Goals (current goals can now be found in the care plan section) Acute Rehab PT Goals Patient Stated Goal: to improve overall strength and mobility PT Goal Formulation: With patient Time For Goal Achievement: 12/30/20 Potential to Achieve Goals: Fair Progress towards PT  goals: Progressing toward goals    Frequency    Min 2X/week      PT Plan Current plan remains appropriate    Co-evaluation              AM-PAC PT "6 Clicks" Mobility   Outcome Measure  Help needed turning from your back to your side while in a flat bed without using bedrails?: None Help needed moving from lying on your back to sitting on the side of a flat bed without using bedrails?: None Help needed moving to and from a bed to a chair (including a wheelchair)?: A Little Help needed standing up from a chair using your arms (e.g., wheelchair or bedside chair)?: A Little Help needed to walk in hospital room?: A Little Help needed climbing 3-5 steps with a railing? : Total 6 Click Score: 18    End of Session Equipment Utilized During Treatment: Gait belt Activity Tolerance: Patient limited by fatigue Patient left: in bed;with call bell/phone within reach;with bed alarm set;with family/visitor present Nurse Communication: Mobility status PT Visit Diagnosis: Muscle weakness (generalized) (M62.81)     Time: 9604-5409 PT Time Calculation (min) (ACUTE ONLY): 25 min  Charges:  $Gait Training: 23-37 mins                     [employer], PT, GCS 12/17/20,2:24 PM

## 2020-12-18 ENCOUNTER — Inpatient Hospital Stay: Payer: Medicare Other

## 2020-12-18 DIAGNOSIS — I4891 Unspecified atrial fibrillation: Secondary | ICD-10-CM | POA: Diagnosis not present

## 2020-12-18 DIAGNOSIS — R609 Edema, unspecified: Secondary | ICD-10-CM | POA: Diagnosis not present

## 2020-12-18 DIAGNOSIS — K264 Chronic or unspecified duodenal ulcer with hemorrhage: Secondary | ICD-10-CM | POA: Diagnosis not present

## 2020-12-18 DIAGNOSIS — E876 Hypokalemia: Secondary | ICD-10-CM | POA: Diagnosis not present

## 2020-12-18 LAB — BASIC METABOLIC PANEL
Anion gap: 6 (ref 5–15)
BUN: 23 mg/dL (ref 8–23)
CO2: 29 mmol/L (ref 22–32)
Calcium: 8.1 mg/dL — ABNORMAL LOW (ref 8.9–10.3)
Chloride: 101 mmol/L (ref 98–111)
Creatinine, Ser: 1.12 mg/dL — ABNORMAL HIGH (ref 0.44–1.00)
GFR, Estimated: 46 mL/min — ABNORMAL LOW (ref >60)
Glucose, Bld: 94 mg/dL (ref 70–99)
Potassium: 3.3 mmol/L — ABNORMAL LOW (ref 3.5–5.1)
Sodium: 136 mmol/L (ref 135–145)

## 2020-12-18 LAB — MAGNESIUM: Magnesium: 1.8 mg/dL (ref 1.7–2.4)

## 2020-12-18 MED ORDER — DOCUSATE SODIUM 100 MG PO CAPS
100.0000 mg | ORAL_CAPSULE | Freq: Two times a day (BID) | ORAL | Status: DC
  Administered 2020-12-19 – 2020-12-21 (×5): 100 mg via ORAL
  Filled 2020-12-18 (×5): qty 1

## 2020-12-18 MED ORDER — MAGNESIUM SULFATE 2 GM/50ML IV SOLN
2.0000 g | Freq: Once | INTRAVENOUS | Status: AC
  Administered 2020-12-18: 2 g via INTRAVENOUS
  Filled 2020-12-18: qty 50

## 2020-12-18 MED ORDER — FUROSEMIDE 10 MG/ML IJ SOLN
20.0000 mg | Freq: Once | INTRAMUSCULAR | Status: AC
  Administered 2020-12-18: 20 mg via INTRAVENOUS
  Filled 2020-12-18: qty 4

## 2020-12-18 MED ORDER — POTASSIUM CHLORIDE CRYS ER 20 MEQ PO TBCR
40.0000 meq | EXTENDED_RELEASE_TABLET | Freq: Every day | ORAL | Status: DC
  Administered 2020-12-18 – 2020-12-19 (×2): 40 meq via ORAL
  Filled 2020-12-18 (×3): qty 2

## 2020-12-18 MED ORDER — MIRTAZAPINE 15 MG PO TBDP
15.0000 mg | ORAL_TABLET | Freq: Every day | ORAL | Status: DC
  Administered 2020-12-18 – 2020-12-20 (×3): 15 mg via ORAL
  Filled 2020-12-18 (×4): qty 1

## 2020-12-18 NOTE — Progress Notes (Addendum)
Progress Note    Jane Campbell  [id number] DOB: 1926/03/10  DOA: 11/30/2020 PCP: Pcp, No      Brief Narrative:    Medical records reviewed and are as summarized below:  Jane Campbell is a 87 y.o. female with past medical history significant for paroxysmal atrial fibrillation, CAD, type 2 diabetes mellitus, essential hypertension, chronic lower extremity edema, hypothyroidism, recently started on anticoagulation with Eliquis 11/09/2020 who presented to [facility] ED from assisted living facility on 10/24 with complaints of generalized weakness, fatigue, crampy abdominal pain, rectal bleeding with melena/bright red blood.  FOBT was positive.  Eliquis was discontinued and she was treated with IV Protonix.  She underwent EGD on 12/02/2020 which showed nonbleeding duodenal ulcer with nonbleeding visible vessel that was treated with epinephrine injection and bipolar cautery.  Discharge to SNF was delayed because screening COVID test came back positive.    Assessment/Plan:   Principal Problem:   GI bleed Active Problems:   Atrial fibrillation with RVR (HCC)   Essential hypertension   Hyperlipidemia   CAD (coronary artery disease)   Edema   Diabetes (HCC)   Hypokalemia    Body mass index is 28.51 kg/m.  Acute upper GI bleeding secondary to duodenal ulcer: S/p EGD on 12/03/2018 which showed nonbleeding duodenal ulcer with nonbleeding visible vessel that was treated with epinephrine injection and cautery.  Continue Protonix twice daily for 8 weeks followed by Protonix daily.   Paroxysmal atrial fibrillation with RVR: Fluctuating heart rate with intermittent tachycardia.  Continue amiodarone and metoprolol.  Outpatient follow-up with Dr. [name] 1 week after discharge.  Eliquis discontinued on admission because of GI bleed.  Acute on chronic diastolic CHF: Give additional dose of IV Lasix today.  Monitor BMP, daily weight and urine output.  2D echo September 2022 showed normal EF,  moderate TR, moderate MR. Repeat chest x-ray on 12/18/2020 showed persistent bilateral chest densities most compatible with pleural effusions and compressive atelectasis.  Overall slightly improved aeration at the lung bases.  Mild AKI: Improving.  Continue to monitor BMP.  Hypokalemia: Replete potassium.  Replete magnesium to keep magnesium above 2.  Depression and anxiety: Continue Xanax as needed for anxiety.  Discussed treatment for depression.  She is open to trial of Remeron.  Thrombocytopenia,hypomagnesemia: Improved  COVID-19 infection: She is tolerating room air.  Generalized weakness: PT and OT recommend discharge to SNF.  Other comorbidities include type II DM, hypothyroidism, hyperlipidemia, hypertension  Diet Order             DIET DYS 3 Room service appropriate? Yes; Fluid consistency: Thin  Diet effective now           Diet - low sodium heart healthy                      Consultants: Gastroenterologist  Procedures: EGD    Medications:    amiodarone  200 mg Oral BID   atorvastatin  10 mg Oral QHS   chlorpheniramine-HYDROcodone  5 mL Oral Q12H   furosemide  20 mg Intravenous Once   furosemide  40 mg Oral Daily   levothyroxine  125 mcg Oral QAC breakfast   loratadine  10 mg Oral Daily   metoprolol tartrate  12.5 mg Oral BID   mirtazapine  15 mg Oral QHS   multivitamin with minerals  1 tablet Oral Daily   pantoprazole  40 mg Oral BID   potassium chloride  40 mEq Oral Daily   sucralfate  1 g Oral TID WC & HS   Continuous Infusions:  magnesium sulfate bolus IVPB       Anti-infectives (From admission, onward)    Start     Dose/Rate Route Frequency Ordered Stop   12/09/20 1515  nirmatrelvir/ritonavir EUA (renal dosing) (PAXLOVID) 2 tablet  Status:  Discontinued        2 tablet Oral 2 times daily 12/09/20 1418 12/09/20 1428   12/05/20 1200  cephALEXin (KEFLEX) capsule 500 mg        500 mg Oral Every 6 hours 12/05/20 0828 12/10/20 0653    12/01/20 1300  cefTRIAXone (ROCEPHIN) 2 g in sodium chloride 0.9 % 100 mL IVPB  Status:  Discontinued        2 g 200 mL/hr over 30 Minutes Intravenous Every 24 hours 12/01/20 1200 12/05/20 0828              Family Communication/Anticipated D/C date and plan/Code Status   DVT prophylaxis: SCDs Start: 12/01/20 0402     Code Status: DNR  Family Communication: None Disposition Plan: Possible discharge to home versus SNF   Status is: Inpatient  Remains inpatient appropriate because: IV Lasix           Subjective:   Interval events noted.  She still complains of cough.  Breathing is a little better.  She complains of generalized weakness.  She said she is feeling depressed.  She said she is not suicidal.  PT at the bedside.  Objective:    Vitals:   12/17/20 2200 12/18/20 0500 12/18/20 0531 12/18/20 0825  BP: 106/70  114/83 115/86  Pulse: (!) 105  (!) 108 (!) 105  Resp: 20  16 18   Temp: 98 F (36.7 C)  97.7 F (36.5 C) 97.7 F (36.5 C)  TempSrc: Oral  Oral Oral  SpO2: 96%  95% 95%  Weight:  73 kg    Height:       No data found.   Intake/Output Summary (Last 24 hours) at 12/18/2020 1548 Last data filed at 12/18/2020 1100 Gross per 24 hour  Intake 60 ml  Output 950 ml  Net -890 ml   Filed Weights   12/12/20 0500 12/13/20 0500 12/18/20 0500  Weight: 76.3 kg 71.3 kg 73 kg    Exam:  GEN: NAD SKIN: Warm and dry EYES: No pallor or icterus ENT: MMM CV: RRR PULM: No wheezing or rales heard. ABD: soft, ND, NT, +BS CNS: AAO x 3, non focal EXT: Bilateral leg edema has improved.  She still has some ankle edema mainly on the left ankle.  No erythema or tenderness. PSYCH: Anxious, frustrated.  Good judgment and insight.        Data Reviewed:   I have personally reviewed following labs and imaging studies:  Labs: Labs show the following:   Basic Metabolic Panel: Recent Labs  Lab 12/16/20 0810 12/17/20 0633 12/18/20 0608  NA 135 137 136   K 4.6 4.2 3.3*  CL 101 104 101  CO2 27 28 29   GLUCOSE 90 104* 94  BUN 22 23 23   CREATININE 1.35* 1.22* 1.12*  CALCIUM 8.5* 8.6* 8.1*  MG  --   --  1.8   GFR Estimated Creatinine Clearance: 30.7 mL/min (A) (by C-G formula based on SCr of 1.12 mg/dL (H)). Liver Function Tests: No results for input(s): AST, ALT, ALKPHOS, BILITOT, PROT, ALBUMIN in the last 168 hours. No results for input(s): LIPASE, AMYLASE in the last 168 hours. No results for input(s):  AMMONIA in the last 168 hours. Coagulation profile No results for input(s): INR, PROTIME in the last 168 hours.  CBC: Recent Labs  Lab 12/16/20 0810 12/17/20 0633  WBC 6.8 7.1  NEUTROABS 5.0 5.0  HGB 8.9* 8.9*  HCT 29.9* 28.7*  MCV 100.0 99.3  PLT 56* 184   Cardiac Enzymes: No results for input(s): CKTOTAL, CKMB, CKMBINDEX, TROPONINI in the last 168 hours. BNP (last 3 results) No results for input(s): PROBNP in the last 8760 hours. CBG: No results for input(s): GLUCAP in the last 168 hours. D-Dimer: No results for input(s): DDIMER in the last 72 hours. Hgb A1c: No results for input(s): HGBA1C in the last 72 hours. Lipid Profile: No results for input(s): CHOL, HDL, LDLCALC, TRIG, CHOLHDL, LDLDIRECT in the last 72 hours. Thyroid function studies: No results for input(s): TSH, T4TOTAL, T3FREE, THYROIDAB in the last 72 hours.  Invalid input(s): FREET3 Anemia work up: No results for input(s): VITAMINB12, FOLATE, FERRITIN, TIBC, IRON, RETICCTPCT in the last 72 hours. Sepsis Labs: Recent Labs  Lab 12/16/20 0810 12/17/20 0633  WBC 6.8 7.1    Microbiology Recent Results (from the past 240 hour(s))  Resp Panel by RT-PCR (Flu A&B, Covid) Nasopharyngeal Swab     Status: Abnormal   Collection Time: 12/09/20  1:00 PM   Specimen: Nasopharyngeal Swab; Nasopharyngeal(NP) swabs in vial transport medium  Result Value Ref Range Status   SARS Coronavirus 2 by RT PCR POSITIVE (A) NEGATIVE Final    Comment: RESULT CALLED TO,  READ BACK BY AND VERIFIED WITH: RACHEL CRESMAN, RN 1408 12/09/20 GM (NOTE) SARS-CoV-2 target nucleic acids are DETECTED.  The SARS-CoV-2 RNA is generally detectable in upper respiratory specimens during the acute phase of infection. Positive results are indicative of the presence of the identified virus, but do not rule out bacterial infection or co-infection with other pathogens not detected by the test. Clinical correlation with patient history and other diagnostic information is necessary to determine patient infection status. The expected result is Negative.  Fact Sheet for Patients: BloggerCourse.com  Fact Sheet for Healthcare Providers: SeriousBroker.it  This test is not yet approved or cleared by the [place] FDA and  has been authorized for detection and/or diagnosis of SARS-CoV-2 by FDA under an Emergency Use Authorization (EUA).  This EUA will remain in effect (meaning this test can b e used) for the duration of  the COVID-19 declaration under Section 564(b)(1) of the Act, 21 U.S.C. section 360bbb-3(b)(1), unless the authorization is terminated or revoked sooner.     Influenza A by PCR NEGATIVE NEGATIVE Final   Influenza B by PCR NEGATIVE NEGATIVE Final    Comment: (NOTE) The Xpert Xpress SARS-CoV-2/FLU/RSV plus assay is intended as an aid in the diagnosis of influenza from Nasopharyngeal swab specimens and should not be used as a sole basis for treatment. Nasal washings and aspirates are unacceptable for Xpert Xpress SARS-CoV-2/FLU/RSV testing.  Fact Sheet for Patients: BloggerCourse.com  Fact Sheet for Healthcare Providers: SeriousBroker.it  This test is not yet approved or cleared by the [place] FDA and has been authorized for detection and/or diagnosis of SARS-CoV-2 by FDA under an Emergency Use Authorization (EUA). This EUA will remain in effect  (meaning this test can be used) for the duration of the COVID-19 declaration under Section 564(b)(1) of the Act, 21 U.S.C. section 360bbb-3(b)(1), unless the authorization is terminated or revoked.  Performed at [facility], [address] [place], [place] [address]   MRSA Next Gen by PCR, Nasal  Status: None   Collection Time: 12/11/20  6:20 AM   Specimen: Nasal Mucosa; Nasal Swab  Result Value Ref Range Status   MRSA by PCR Next Gen NOT DETECTED NOT DETECTED Final    Comment: (NOTE) The GeneXpert MRSA Assay (FDA approved for NASAL specimens only), is one component of a comprehensive MRSA colonization surveillance program. It is not intended to diagnose MRSA infection nor to guide or monitor treatment for MRSA infections. Test performance is not FDA approved in patients less than 43 years old. Performed at [facility], [address], [place], [place] [address]     Procedures and diagnostic studies:  DG Chest [facility] 1 View  Result Date: 12/18/2020 CLINICAL DATA:  Weakness and generalized fatigue. EXAM: PORTABLE CHEST 1 VIEW COMPARISON:  12/16/2020 FINDINGS: Again noted are basilar densities suggestive for pleural effusions. Improved aeration at the lung bases compared to the previous examination. Upper lungs are clear without pulmonary edema. Again noted is severe scoliosis in the thoracolumbar spine. Heart size is grossly stable. IMPRESSION: Persistent basilar chest densities are most compatible with pleural effusions and compressive atelectasis. Overall, slightly improved aeration at the lung bases compared to 12/16/2020. Electronically Signed   By: Richarda Overlie M.D.   On: 12/18/2020 12:58               LOS: 18 days   Drevin Ortner  [facility]   Pager on [email or web address]. If 7PM-7AM, please contact night-coverage at [email or web address]     12/18/2020, 3:48 PM

## 2020-12-18 NOTE — Progress Notes (Signed)
Occupational Therapy Treatment Patient Details Name: Jane Campbell MRN: [id number] DOB: 04/15/1926 Today's Date: 12/18/2020   History of present illness Pt is a 87 y.o. female presenting to hospital 10/24 with c/o nausea, crampy abdominal pain, and rectal bleeding; generalized weakness and had to lower self to ground.  Pt admitted with GI bleed, a-fib with RVR, hypotension, hypokalemia, and B LE edema.  S/p EGD 10/26 (found to have acute upper GI bleed 2/2 duodenal ulcer).  PMH includes a-fib, DM, htn, on Eliquis, large hiatal hernia.   OT comments  Jane Campbell presented today with generalized weakness, limited endurance, and impaired balance. She required Mod A for sit<>stand and exhibited poor standing balance. She attempted grooming tasks in standing but quickly had to transition to sitting. Pt had episode of urinary incontinence, was able to maintain standing balance with RW for clean-up but required Max A for peri-care. This therapist recommends STR for Jane Campbell, prior to her returning to ALF; pt in agreement, stating bluntly that she feels too tired, weak, and unstable at present to do the basic care tasks she would need to do at [facility]. DC recommendations changed from [facility] to SNF.   Recommendations for follow up therapy are one component of a multi-disciplinary discharge planning process, led by the attending physician.  Recommendations may be updated based on patient status, additional functional criteria and insurance authorization.    Follow Up Recommendations  Skilled nursing-short term rehab (<3 hours/day)    Assistance Recommended at Discharge Frequent or constant Supervision/Assistance  Equipment Recommendations  None recommended by OT    Recommendations for Other Services      Precautions / Restrictions Precautions Precautions: Fall Precaution Comments: COVID + Restrictions Weight Bearing Restrictions: No       Mobility Bed Mobility               General bed  mobility comments: Pt received/left in recliner    Transfers Overall transfer level: Needs assistance Equipment used: Rolling walker (2 wheels) Transfers: Sit to/from Stand Sit to Stand: Mod assist Stand pivot transfers: Min assist         General transfer comment: Mod A for sit<>stand from recliner     Balance Overall balance assessment: Needs assistance Sitting-balance support: Feet supported Sitting balance-Leahy Scale: Good Sitting balance - Comments: reaching outside BOS to engage in seated ADLs   Standing balance support: Bilateral upper extremity supported;Single extremity supported Standing balance-Leahy Scale: Fair Standing balance comment: could briefly maintain standing balance with single UE support                           ADL either performed or assessed with clinical judgement   ADL Overall ADL's : Needs assistance/impaired     Grooming: Wash/dry hands;Supervision/safety;Set up;Sitting;Wash/dry face;Oral care                                 General ADL Comments: Pt unable to engage in standing ADLs this date, 2/2 fatigue    Extremity/Trunk Assessment Upper Extremity Assessment Upper Extremity Assessment: Generalized weakness   Lower Extremity Assessment Lower Extremity Assessment: Generalized weakness   Cervical / Trunk Assessment Cervical / Trunk Assessment: Kyphotic    Vision       Perception     Praxis      Cognition Arousal/Alertness: Awake/alert Behavior During Therapy: WFL for tasks assessed/performed;Flat affect Overall Cognitive Status: Within Functional  Limits for tasks assessed                                            Exercises Other Exercises Other Exercises: Educ re: HEP, ECS, falls preventions, DC options   Shoulder Instructions       General Comments      Pertinent Vitals/ Pain       Pain Assessment: No/denies pain  Home Living                                           Prior Functioning/Environment              Frequency  Min 2X/week        Progress Toward Goals  OT Goals(current goals can now be found in the care plan section)  Progress towards OT goals: Progressing toward goals  Acute Rehab OT Goals Patient Stated Goal: to get stronger OT Goal Formulation: With patient Time For Goal Achievement: 12/30/20 Potential to Achieve Goals: Good  Plan Frequency remains appropriate;Discharge plan needs to be updated    Co-evaluation                 AM-PAC OT "6 Clicks" Daily Activity     Outcome Measure   Help from another person eating meals?: None Help from another person taking care of personal grooming?: A Little Help from another person toileting, which includes using toliet, bedpan, or urinal?: A Lot Help from another person bathing (including washing, rinsing, drying)?: A Lot Help from another person to put on and taking off regular upper body clothing?: A Little Help from another person to put on and taking off regular lower body clothing?: A Lot 6 Click Score: 16    End of Session Equipment Utilized During Treatment: Rolling walker (2 wheels)  OT Visit Diagnosis: Unsteadiness on feet (R26.81);Muscle weakness (generalized) (M62.81)   Activity Tolerance Patient tolerated treatment well   Patient Left in chair;with call bell/phone within reach;with chair alarm set   Nurse Communication          Time: 4585-9292 OT Time Calculation (min): 26 min  Charges: OT General Charges $OT Visit: 1 Visit OT Treatments $Self Care/Home Management : 23-37 mins  Latina Craver, PhD, MS, OTR/L 12/18/20, 1:22 PM

## 2020-12-18 NOTE — TOC Progression Note (Addendum)
Transition of Care [facility]) - Progression Note    Patient Details  Name: Jane Campbell MRN: [id number] Date of Birth: Jun 08, 1926  [facility] CM/SW Contact  Margarito Liner, LCSW Phone Number: 12/18/2020, 1:54 PM  Clinical Narrative: PT and OT are changing recommendation to SNF. Asked them to work with her over the weekend if possible so that we are sure before ALF comes to assess her on Monday. Updated daughter. She confirmed [facility]. [facility] is aware and would be able to take her [facility]/13.  Expected Discharge Plan and Services           Expected Discharge Date: 12/09/20                                     Social Determinants of Health (SDOH) Interventions    Readmission Risk Interventions No flowsheet data found.

## 2020-12-18 NOTE — Progress Notes (Signed)
Physical Therapy Treatment Patient Details Name: Jane Campbell MRN: [id number] DOB: October 31, 1926 Today's Date: 12/18/2020   History of Present Illness Pt is a 87 y.o. female presenting to hospital 10/24 with c/o nausea, crampy abdominal pain, and rectal bleeding; generalized weakness and had to lower self to ground.  Pt admitted with GI bleed, a-fib with RVR, hypotension, hypokalemia, and B LE edema.  S/p EGD 10/26 (found to have acute upper GI bleed 2/2 duodenal ulcer).  PMH includes a-fib, DM, htn, on Eliquis, large hiatal hernia.    PT Comments    Pt stated she was frustrated about not being able to discharge and reported feeling depressed to MD, who entered the room during the session. Pt was cooperative to all bed exercises and wanted to get up and walk. Pt was unable to stand from lowest bed setting, but required min assist when doing STS from elevated bed surface. Pt ambulated in the room with CGA and no rest breaks, but did not want to continue walking. Pt's SpO2 and HR remained WNL during the session. Pt will benefit from PT services in a SNF setting upon discharge to safely address deficits listed in patient problem list for decreased caregiver assistance and eventual return to PLOF.    Recommendations for follow up therapy are one component of a multi-disciplinary discharge planning process, led by the attending physician.  Recommendations may be updated based on patient status, additional functional criteria and insurance authorization.  Follow Up Recommendations  SNF     Assistance Recommended at Discharge Intermittent Supervision/Assistance  Equipment Recommendations  BSC/3in1    Recommendations for Other Services       Precautions / Restrictions Precautions Precautions: Fall Precaution Comments: COVID + Restrictions Weight Bearing Restrictions: No     Mobility  Bed Mobility Overal bed mobility: Modified Independent Bed Mobility: Sit to Supine     Supine to sit:  Modified independent (Device/Increase time);HOB elevated        Transfers Overall transfer level: Needs assistance Equipment used: Rolling walker (2 wheels) Transfers: Sit to/from Stand Sit to Stand: From elevated surface;Min assist Stand pivot transfers: Min assist         General transfer comment: Pt needed bed elevated after failed attmept to STS from lowest setting, mod assist from lowest setting but min assist from elevated surface    Ambulation/Gait Ambulation/Gait assistance: Min guard Gait Distance (Feet): 20 Feet Assistive device: Rolling walker (2 wheels) Gait Pattern/deviations: Step-through pattern;Decreased step length - right;Decreased step length - left;Trunk flexed Gait velocity: decreased     General Gait Details: Increased time and effort, SpO2 and HR stayed WNL, 22ft was max distance pt could handle, highly reliant on RW for UE support and cuing for upright posture, short bilat step length and gait close to shuffling pattern    Stairs             Wheelchair Mobility    Modified Rankin (Stroke Patients Only)       Balance Overall balance assessment: Needs assistance Sitting-balance support: Feet supported Sitting balance-Leahy Scale: Good Sitting balance - Comments: reaching outside BOS to engage in seated ADLs   Standing balance support: Bilateral upper extremity supported;During functional activity Standing balance-Leahy Scale: Fair Standing balance comment: could briefly maintain standing balance with single UE support                            Cognition Arousal/Alertness: Awake/alert Behavior During Therapy: WFL for tasks assessed/performed;Flat  affect Overall Cognitive Status: Within Functional Limits for tasks assessed                                 General Comments: Patient reports she is depressed, MD aware        Exercises Total Joint Exercises Ankle Circles/Pumps: AROM;Both;10 reps;Supine Quad  Sets: AROM;Both;10 reps;Supine (2-3 second holds) Other Exercises Other Exercises: Static unsupported sitting at EOB for improved activity tolerance     General Comments        Pertinent Vitals/Pain Pain Assessment: No/denies pain    Home Living                          Prior Function            PT Goals (current goals can now be found in the care plan section) Acute Rehab PT Goals Patient Stated Goal: to improve overall strength and mobility PT Goal Formulation: With patient Time For Goal Achievement: 12/30/20 Potential to Achieve Goals: Fair Progress towards PT goals: Progressing toward goals    Frequency    Min 2X/week      PT Plan Current plan remains appropriate    Co-evaluation              AM-PAC PT "6 Clicks" Mobility   Outcome Measure  Help needed turning from your back to your side while in a flat bed without using bedrails?: None Help needed moving from lying on your back to sitting on the side of a flat bed without using bedrails?: None Help needed moving to and from a bed to a chair (including a wheelchair)?: A Little Help needed standing up from a chair using your arms (e.g., wheelchair or bedside chair)?: A Little Help needed to walk in hospital room?: A Little Help needed climbing 3-5 steps with a railing? : Total 6 Click Score: 18    End of Session Equipment Utilized During Treatment: Gait belt Activity Tolerance: Patient limited by fatigue Patient left: with call bell/phone within reach;in chair;with chair alarm set;with SCD's reapplied Nurse Communication: Mobility status PT Visit Diagnosis: Muscle weakness (generalized) (M62.81)     Time: 5427-0623 PT Time Calculation (min) (ACUTE ONLY): 47 min  Charges:                        Hildred Alamin SPT 12/18/20, 3:24 PM

## 2020-12-19 DIAGNOSIS — K264 Chronic or unspecified duodenal ulcer with hemorrhage: Secondary | ICD-10-CM | POA: Diagnosis not present

## 2020-12-19 DIAGNOSIS — I4891 Unspecified atrial fibrillation: Secondary | ICD-10-CM | POA: Diagnosis not present

## 2020-12-19 LAB — BASIC METABOLIC PANEL
Anion gap: 4 — ABNORMAL LOW (ref 5–15)
BUN: 24 mg/dL — ABNORMAL HIGH (ref 8–23)
CO2: 32 mmol/L (ref 22–32)
Calcium: 8.2 mg/dL — ABNORMAL LOW (ref 8.9–10.3)
Chloride: 100 mmol/L (ref 98–111)
Creatinine, Ser: 1.21 mg/dL — ABNORMAL HIGH (ref 0.44–1.00)
GFR, Estimated: 42 mL/min — ABNORMAL LOW (ref >60)
Glucose, Bld: 129 mg/dL — ABNORMAL HIGH (ref 70–99)
Potassium: 3.2 mmol/L — ABNORMAL LOW (ref 3.5–5.1)
Sodium: 136 mmol/L (ref 135–145)

## 2020-12-19 LAB — MAGNESIUM: Magnesium: 2.2 mg/dL (ref 1.7–2.4)

## 2020-12-19 MED ORDER — POLYETHYLENE GLYCOL 3350 17 G PO PACK
17.0000 g | PACK | Freq: Every day | ORAL | Status: DC
  Administered 2020-12-19 – 2020-12-21 (×3): 17 g via ORAL
  Filled 2020-12-19 (×3): qty 1

## 2020-12-19 MED ORDER — METOPROLOL SUCCINATE ER 50 MG PO TB24
50.0000 mg | ORAL_TABLET | Freq: Two times a day (BID) | ORAL | Status: DC
  Administered 2020-12-19 – 2020-12-21 (×4): 50 mg via ORAL
  Filled 2020-12-19 (×5): qty 1

## 2020-12-19 NOTE — TOC Progression Note (Signed)
Transition of Care [facility]) - Progression Note    Patient Details  Name: Jane Campbell MRN: [id number] Date of Birth: 09-05-1926  [facility]) CM/SW Contact  Liliana Cline, LCSW Phone Number: 12/19/2020, 4:30 PM  Clinical Narrative:   Per Inetta Fermo, [facility] cannot take patient until Monday.          Expected Discharge Plan and Services           Expected Discharge Date: 12/09/20                                     Social Determinants of Health (SDOH) Interventions    Readmission Risk Interventions No flowsheet data found.

## 2020-12-19 NOTE — Progress Notes (Signed)
Progress Note    Jane Campbell  UYQ:[id number] DOB: 1926/10/10  DOA: 11/30/2020 PCP: Pcp, No      Brief Narrative:    Medical records reviewed and are as summarized below:  Jane Campbell is a 87 y.o. female with past medical history significant for paroxysmal atrial fibrillation, CAD, type 2 diabetes mellitus, essential hypertension, chronic lower extremity edema, hypothyroidism, recently started on anticoagulation with Eliquis 11/09/2020 who presented to [facility] ED from assisted living facility on 10/24 with complaints of generalized weakness, fatigue, crampy abdominal pain, rectal bleeding with melena/bright red blood.  FOBT was positive.  Eliquis was discontinued and she was treated with IV Protonix.  She underwent EGD on 12/02/2020 which showed nonbleeding duodenal ulcer with nonbleeding visible vessel that was treated with epinephrine injection and bipolar cautery.  Discharge to SNF was delayed because screening COVID test came back positive.    Assessment/Plan:   Principal Problem:   GI bleed Active Problems:   Atrial fibrillation with RVR (HCC)   Essential hypertension   Hyperlipidemia   CAD (coronary artery disease)   Edema   Diabetes (HCC)   Hypokalemia    Body mass index is 29.02 kg/m.  Acute upper GI bleeding secondary to duodenal ulcer: S/p EGD on 12/02/2020 which showed nonbleeding duodenal ulcer with nonbleeding visible vessel that was treated with epinephrine injection and cautery.  Continue Protonix twice daily for 8 weeks followed by Protonix daily.   Paroxysmal atrial fibrillation with RVR: Fluctuating heart rate with intermittent tachycardia.  Continue metoprolol and amiodarone.  Substitute Toprol-XL for blood pressure.  Resume home dose of 50 mg twice daily for adequate rate control.   Outpatient follow-up with Dr. [name] 1 week after discharge.  Eliquis discontinued on admission because of GI bleed.  Acute on chronic diastolic CHF: Continue oral Lasix.   Monitor BMP, daily weight and urine output.  2D echo September 2022 showed normal EF, moderate TR, moderate MR. Repeat chest x-ray on 12/18/2020 showed persistent bilateral chest densities most compatible with pleural effusions and compressive atelectasis.  Overall slightly improved aeration at the lung bases.  Mild AKI: Creatinine is stable.  Continue to monitor.  Hypokalemia: Replete potassium.  Replete magnesium to keep magnesium above 2.  Depression and anxiety: Continue Xanax as needed for anxiety.  Continue Remeron.  Thrombocytopenia,hypomagnesemia: Improved  COVID-19 infection: She is tolerating room air.  Generalized weakness: PT and OT recommend discharge to SNF.  Other comorbidities include type II DM, hypothyroidism, hyperlipidemia, hypertension  Diet Order             DIET DYS 3 Room service appropriate? Yes; Fluid consistency: Thin  Diet effective now           Diet - low sodium heart healthy                      Consultants: Gastroenterologist  Procedures: EGD    Medications:    amiodarone  200 mg Oral BID   atorvastatin  10 mg Oral QHS   chlorpheniramine-HYDROcodone  5 mL Oral Q12H   docusate sodium  100 mg Oral BID   furosemide  40 mg Oral Daily   levothyroxine  125 mcg Oral QAC breakfast   loratadine  10 mg Oral Daily   metoprolol tartrate  12.5 mg Oral BID   mirtazapine  15 mg Oral QHS   multivitamin with minerals  1 tablet Oral Daily   pantoprazole  40 mg Oral BID   polyethylene glycol  17 g Oral Daily   potassium chloride  40 mEq Oral Daily   sucralfate  1 g Oral TID WC & HS   Continuous Infusions:     Anti-infectives (From admission, onward)    Start     Dose/Rate Route Frequency Ordered Stop   12/09/20 1515  nirmatrelvir/ritonavir EUA (renal dosing) (PAXLOVID) 2 tablet  Status:  Discontinued        2 tablet Oral 2 times daily 12/09/20 1418 12/09/20 1428   12/05/20 1200  cephALEXin (KEFLEX) capsule 500 mg        500 mg Oral  Every 6 hours 12/05/20 0828 12/10/20 0653   12/01/20 1300  cefTRIAXone (ROCEPHIN) 2 g in sodium chloride 0.9 % 100 mL IVPB  Status:  Discontinued        2 g 200 mL/hr over 30 Minutes Intravenous Every 24 hours 12/01/20 1200 12/05/20 0828              Family Communication/Anticipated D/C date and plan/Code Status   DVT prophylaxis: SCDs Start: 12/01/20 0402     Code Status: DNR  Family Communication: None Disposition Plan: Possible discharge to home versus SNF   Status is: Inpatient  Remains inpatient appropriate because: IV Lasix           Subjective:   She says she is feeling a little better.  She thinks her appetite is coming back.  Cough is better.  Objective:    Vitals:   12/18/20 0825 12/18/20 1555 12/18/20 2007 12/19/20 0543  BP: 115/86 (!) 135/98 103/65 113/86  Pulse: (!) 105 (!) 110 100 (!) 106  Resp: 18 18 16 16   Temp: 97.7 F (36.5 C) 97.6 F (36.4 C) 98.5 F (36.9 C) (!) 97.5 F (36.4 C)  TempSrc: Oral  Oral Oral  SpO2: 95% 97% 96% 94%  Weight:    74.3 kg  Height:       No data found.   Intake/Output Summary (Last 24 hours) at 12/19/2020 1428 Last data filed at 12/19/2020 0550 Gross per 24 hour  Intake --  Output 600 ml  Net -600 ml   Filed Weights   12/13/20 0500 12/18/20 0500 12/19/20 0543  Weight: 71.3 kg 73 kg 74.3 kg    Exam:  GEN: NAD SKIN: Warm and dry EYES: No pallor or icterus ENT: MMM CV: Irregular rate and rhythm, tachycardic PULM: CTA B ABD: soft, ND, NT, +BS CNS: AAO x 3, non focal EXT: No edema or tenderness PSYCH: Calm and more relaxed today         Data Reviewed:   I have personally reviewed following labs and imaging studies:  Labs: Labs show the following:   Basic Metabolic Panel: Recent Labs  Lab 12/16/20 0810 12/17/20 0633 12/18/20 0608 12/19/20 0453  NA 135 137 136 136  K 4.6 4.2 3.3* 3.2*  CL 101 104 101 100  CO2 27 28 29  32  GLUCOSE 90 104* 94 129*  BUN 22 23 23  24*   CREATININE 1.35* 1.22* 1.12* 1.21*  CALCIUM 8.5* 8.6* 8.1* 8.2*  MG  --   --  1.8 2.2   GFR Estimated Creatinine Clearance: 28.7 mL/min (A) (by C-G formula based on SCr of 1.21 mg/dL (H)). Liver Function Tests: No results for input(s): AST, ALT, ALKPHOS, BILITOT, PROT, ALBUMIN in the last 168 hours. No results for input(s): LIPASE, AMYLASE in the last 168 hours. No results for input(s): AMMONIA in the last 168 hours. Coagulation profile No results for input(s): INR,  PROTIME in the last 168 hours.  CBC: Recent Labs  Lab 12/16/20 0810 12/17/20 0633  WBC 6.8 7.1  NEUTROABS 5.0 5.0  HGB 8.9* 8.9*  HCT 29.9* 28.7*  MCV 100.0 99.3  PLT 56* 184   Cardiac Enzymes: No results for input(s): CKTOTAL, CKMB, CKMBINDEX, TROPONINI in the last 168 hours. BNP (last 3 results) No results for input(s): PROBNP in the last 8760 hours. CBG: No results for input(s): GLUCAP in the last 168 hours. D-Dimer: No results for input(s): DDIMER in the last 72 hours. Hgb A1c: No results for input(s): HGBA1C in the last 72 hours. Lipid Profile: No results for input(s): CHOL, HDL, LDLCALC, TRIG, CHOLHDL, LDLDIRECT in the last 72 hours. Thyroid function studies: No results for input(s): TSH, T4TOTAL, T3FREE, THYROIDAB in the last 72 hours.  Invalid input(s): FREET3 Anemia work up: No results for input(s): VITAMINB12, FOLATE, FERRITIN, TIBC, IRON, RETICCTPCT in the last 72 hours. Sepsis Labs: Recent Labs  Lab 12/16/20 0810 12/17/20 0633  WBC 6.8 7.1    Microbiology Recent Results (from the past 240 hour(s))  MRSA Next Gen by PCR, Nasal     Status: None   Collection Time: 12/11/20  6:20 AM   Specimen: Nasal Mucosa; Nasal Swab  Result Value Ref Range Status   MRSA by PCR Next Gen NOT DETECTED NOT DETECTED Final    Comment: (NOTE) The GeneXpert MRSA Assay (FDA approved for NASAL specimens only), is one component of a comprehensive MRSA colonization surveillance program. It is not intended to  diagnose MRSA infection nor to guide or monitor treatment for MRSA infections. Test performance is not FDA approved in patients less than 84 years old. Performed at [facility], [address], [place], [place] [address]     Procedures and diagnostic studies:  DG Chest [facility] 1 View  Result Date: 12/18/2020 CLINICAL DATA:  Weakness and generalized fatigue. EXAM: PORTABLE CHEST 1 VIEW COMPARISON:  12/16/2020 FINDINGS: Again noted are basilar densities suggestive for pleural effusions. Improved aeration at the lung bases compared to the previous examination. Upper lungs are clear without pulmonary edema. Again noted is severe scoliosis in the thoracolumbar spine. Heart size is grossly stable. IMPRESSION: Persistent basilar chest densities are most compatible with pleural effusions and compressive atelectasis. Overall, slightly improved aeration at the lung bases compared to 12/16/2020. Electronically Signed   By: Richarda Overlie M.D.   On: 12/18/2020 12:58               LOS: 19 days   Jane Campbell  [facility]   Pager on [email or web address]. If 7PM-7AM, please contact night-coverage at [email or web address]     12/19/2020, 2:28 PM

## 2020-12-20 DIAGNOSIS — I4891 Unspecified atrial fibrillation: Secondary | ICD-10-CM | POA: Diagnosis not present

## 2020-12-20 DIAGNOSIS — K264 Chronic or unspecified duodenal ulcer with hemorrhage: Secondary | ICD-10-CM | POA: Diagnosis not present

## 2020-12-20 DIAGNOSIS — E876 Hypokalemia: Secondary | ICD-10-CM | POA: Diagnosis not present

## 2020-12-20 LAB — BASIC METABOLIC PANEL
Anion gap: 5 (ref 5–15)
BUN: 25 mg/dL — ABNORMAL HIGH (ref 8–23)
CO2: 31 mmol/L (ref 22–32)
Calcium: 8.1 mg/dL — ABNORMAL LOW (ref 8.9–10.3)
Chloride: 101 mmol/L (ref 98–111)
Creatinine, Ser: 1.1 mg/dL — ABNORMAL HIGH (ref 0.44–1.00)
GFR, Estimated: 47 mL/min — ABNORMAL LOW (ref >60)
Glucose, Bld: 136 mg/dL — ABNORMAL HIGH (ref 70–99)
Potassium: 3.3 mmol/L — ABNORMAL LOW (ref 3.5–5.1)
Sodium: 137 mmol/L (ref 135–145)

## 2020-12-20 LAB — MAGNESIUM: Magnesium: 1.9 mg/dL (ref 1.7–2.4)

## 2020-12-20 MED ORDER — BISACODYL 10 MG RE SUPP
10.0000 mg | Freq: Every day | RECTAL | Status: DC | PRN
  Administered 2020-12-20: 10 mg via RECTAL
  Filled 2020-12-20: qty 1

## 2020-12-20 MED ORDER — HYDROCORTISONE (PERIANAL) 2.5 % EX CREA
TOPICAL_CREAM | Freq: Two times a day (BID) | CUTANEOUS | Status: DC
  Filled 2020-12-20: qty 28.35

## 2020-12-20 MED ORDER — MAGNESIUM SULFATE 2 GM/50ML IV SOLN
2.0000 g | Freq: Once | INTRAVENOUS | Status: AC
  Administered 2020-12-20: 2 g via INTRAVENOUS
  Filled 2020-12-20: qty 50

## 2020-12-20 MED ORDER — POTASSIUM CHLORIDE CRYS ER 20 MEQ PO TBCR
40.0000 meq | EXTENDED_RELEASE_TABLET | Freq: Two times a day (BID) | ORAL | Status: DC
  Administered 2020-12-20 (×2): 40 meq via ORAL
  Filled 2020-12-20 (×3): qty 2

## 2020-12-20 NOTE — Progress Notes (Addendum)
Progress Note    Jane Campbell  GPQ:[id number] DOB: [birth year]  DOA: 11/30/2020 PCP: Pcp, No      Brief Narrative:    Medical records reviewed and are as summarized below:  Jane Campbell is a 87 y.o. female with past medical history significant for paroxysmal atrial fibrillation, CAD, type 2 diabetes mellitus, essential hypertension, chronic lower extremity edema, hypothyroidism, recently started on anticoagulation with Eliquis 11/09/2020 who presented to [facility] ED from assisted living facility on 10/24 with complaints of generalized weakness, fatigue, crampy abdominal pain, rectal bleeding with melena/bright red blood.  FOBT was positive.  Eliquis was discontinued and she was treated with IV Protonix.  She underwent EGD on 12/02/2020 which showed nonbleeding duodenal ulcer with nonbleeding visible vessel that was treated with epinephrine injection and bipolar cautery.  Discharge to SNF was delayed because screening COVID test came back positive.    Assessment/Plan:   Principal Problem:   GI bleed Active Problems:   Atrial fibrillation with RVR (HCC)   Essential hypertension   Hyperlipidemia   CAD (coronary artery disease)   Edema   Diabetes (HCC)   Hypokalemia    Body mass index is 29.02 kg/m.  Acute upper GI bleeding secondary to duodenal ulcer: S/p EGD on 12/02/2020 which showed nonbleeding duodenal ulcer with nonbleeding visible vessel that was treated with epinephrine injection and cautery.  Continue Protonix twice daily for 8 weeks followed by Protonix daily.   Paroxysmal atrial fibrillation with RVR: Fluctuating heart rate with intermittent tachycardia.  Continue Toprol-XL and amiodarone. Outpatient follow-up with Dr. [name] 1 week after discharge.  Eliquis discontinued on admission because of GI bleed.  Acute on chronic diastolic CHF: Continue oral Lasix.  Monitor BMP, daily weight and urine output.  2D echo September 2022 showed normal EF, moderate TR, moderate MR.  Repeat chest x-ray on 12/18/2020 showed persistent bilateral chest densities most compatible with pleural effusions and compressive atelectasis.  Overall slightly improved aeration at the lung bases.  Mild AKI: Creatinine is stable.  Continue to monitor.  Hypokalemia: Replete potassium.  Continue magnesium repletion to keep level at 2 or more  Depression and anxiety: Continue Xanax as needed for anxiety.  Continue Remeron.  Thrombocytopenia,hypomagnesemia: Improved  COVID-19 infection: She is tolerating room air.  Generalized weakness: PT and OT recommend discharge to SNF.  Constipation, mildly prolapsed hemorrhoids: Continue daily MiraLAX and Colace.  Added Dulcolax suppository.  Anusol cream was prescribed as well.  Other comorbidities include type II DM, hypothyroidism, hyperlipidemia, hypertension  Diet Order             DIET DYS 3 Room service appropriate? Yes; Fluid consistency: Thin  Diet effective now           Diet - low sodium heart healthy                      Consultants: Gastroenterologist  Procedures: EGD    Medications:    amiodarone  200 mg Oral BID   atorvastatin  10 mg Oral QHS   chlorpheniramine-HYDROcodone  5 mL Oral Q12H   docusate sodium  100 mg Oral BID   furosemide  40 mg Oral Daily   levothyroxine  125 mcg Oral QAC breakfast   loratadine  10 mg Oral Daily   metoprolol succinate  50 mg Oral BID   mirtazapine  15 mg Oral QHS   multivitamin with minerals  1 tablet Oral Daily   pantoprazole  40 mg Oral BID  polyethylene glycol  17 g Oral Daily   potassium chloride  40 mEq Oral BID   sucralfate  1 g Oral TID WC & HS   Continuous Infusions:     Anti-infectives (From admission, onward)    Start     Dose/Rate Route Frequency Ordered Stop   12/09/20 1515  nirmatrelvir/ritonavir EUA (renal dosing) (PAXLOVID) 2 tablet  Status:  Discontinued        2 tablet Oral 2 times daily 12/09/20 1418 12/09/20 1428   12/05/20 1200  cephALEXin  (KEFLEX) capsule 500 mg        500 mg Oral Every 6 hours 12/05/20 0828 12/10/20 0653   12/01/20 1300  cefTRIAXone (ROCEPHIN) 2 g in sodium chloride 0.9 % 100 mL IVPB  Status:  Discontinued        2 g 200 mL/hr over 30 Minutes Intravenous Every 24 hours 12/01/20 1200 12/05/20 0828              Family Communication/Anticipated D/C date and plan/Code Status   DVT prophylaxis: SCDs Start: 12/01/20 0402     Code Status: DNR  Family Communication: None Disposition Plan: Possible discharge to home versus SNF   Status is: Inpatient  Remains inpatient appropriate because: IV Lasix           Subjective:   She says she is feeling a little better.  She thinks her appetite is coming back.  Cough is better.  Objective:    Vitals:   12/19/20 2023 12/19/20 2233 12/20/20 0702 12/20/20 0747  BP: 129/88 117/78 116/78 115/76  Pulse: (!) 119 97 (!) 110 (!) 109  Resp: 20 16 16 18   Temp: 98.5 F (36.9 C) 98.3 F (36.8 C) 97.6 F (36.4 C) 98.9 F (37.2 C)  TempSrc:      SpO2: 96% 97% 93% 96%  Weight:      Height:       No data found.   Intake/Output Summary (Last 24 hours) at 12/20/2020 1130 Last data filed at 12/19/2020 2142 Gross per 24 hour  Intake 0 ml  Output 500 ml  Net -500 ml   Filed Weights   12/13/20 0500 12/18/20 0500 12/19/20 0543  Weight: 71.3 kg 73 kg 74.3 kg    Exam:  GEN: NAD SKIN: Warm and dry EYES: No pallor or icterus ENT: MMM CV: Irregular rate and rhythm, tachycardic PULM: CTA B ABD: soft, ND, NT, +BS CNS: AAO x 3, non focal EXT: No edema or tenderness PSYCH: Calm and more relaxed today RECTAL: Inspection patient has what appears to be mildly prolapsed hemorrhoids.      13/12/22, RN, was at the bedside (chaperone)     Data Reviewed:   I have personally reviewed following labs and imaging studies:  Labs: Labs show the following:   Basic Metabolic Panel: Recent Labs  Lab 12/16/20 0810 12/17/20 0633  12/18/20 0608 12/19/20 0453 12/20/20 0441  NA 135 137 136 136 137  K 4.6 4.2 3.3* 3.2* 3.3*  CL 101 104 101 100 101  CO2 27 28 29  32 31  GLUCOSE 90 104* 94 129* 136*  BUN 22 23 23  24* 25*  CREATININE 1.35* 1.22* 1.12* 1.21* 1.10*  CALCIUM 8.5* 8.6* 8.1* 8.2* 8.1*  MG  --   --  1.8 2.2 1.9   GFR Estimated Creatinine Clearance: 31.5 mL/min (A) (by C-G formula based on SCr of 1.1 mg/dL (H)). Liver Function Tests: No results for input(s): AST, ALT, ALKPHOS, BILITOT, PROT, ALBUMIN in the  last 168 hours. No results for input(s): LIPASE, AMYLASE in the last 168 hours. No results for input(s): AMMONIA in the last 168 hours. Coagulation profile No results for input(s): INR, PROTIME in the last 168 hours.  CBC: Recent Labs  Lab 12/16/20 0810 12/17/20 0633  WBC 6.8 7.1  NEUTROABS 5.0 5.0  HGB 8.9* 8.9*  HCT 29.9* 28.7*  MCV 100.0 99.3  PLT 56* 184   Cardiac Enzymes: No results for input(s): CKTOTAL, CKMB, CKMBINDEX, TROPONINI in the last 168 hours. BNP (last 3 results) No results for input(s): PROBNP in the last 8760 hours. CBG: No results for input(s): GLUCAP in the last 168 hours. D-Dimer: No results for input(s): DDIMER in the last 72 hours. Hgb A1c: No results for input(s): HGBA1C in the last 72 hours. Lipid Profile: No results for input(s): CHOL, HDL, LDLCALC, TRIG, CHOLHDL, LDLDIRECT in the last 72 hours. Thyroid function studies: No results for input(s): TSH, T4TOTAL, T3FREE, THYROIDAB in the last 72 hours.  Invalid input(s): FREET3 Anemia work up: No results for input(s): VITAMINB12, FOLATE, FERRITIN, TIBC, IRON, RETICCTPCT in the last 72 hours. Sepsis Labs: Recent Labs  Lab 12/16/20 0810 12/17/20 0633  WBC 6.8 7.1    Microbiology Recent Results (from the past 240 hour(s))  MRSA Next Gen by PCR, Nasal     Status: None   Collection Time: 12/11/20  6:20 AM   Specimen: Nasal Mucosa; Nasal Swab  Result Value Ref Range Status   MRSA by PCR Next Gen NOT  DETECTED NOT DETECTED Final    Comment: (NOTE) The GeneXpert MRSA Assay (FDA approved for NASAL specimens only), is one component of a comprehensive MRSA colonization surveillance program. It is not intended to diagnose MRSA infection nor to guide or monitor treatment for MRSA infections. Test performance is not FDA approved in patients less than 35 years old. Performed at [facility], [address] [place], [place] [address]     Procedures and diagnostic studies:  DG Chest [facility] 1 View  Result Date: 12/18/2020 CLINICAL DATA:  Weakness and generalized fatigue. EXAM: PORTABLE CHEST 1 VIEW COMPARISON:  12/16/2020 FINDINGS: Again noted are basilar densities suggestive for pleural effusions. Improved aeration at the lung bases compared to the previous examination. Upper lungs are clear without pulmonary edema. Again noted is severe scoliosis in the thoracolumbar spine. Heart size is grossly stable. IMPRESSION: Persistent basilar chest densities are most compatible with pleural effusions and compressive atelectasis. Overall, slightly improved aeration at the lung bases compared to 12/16/2020. Electronically Signed   By: Richarda Overlie M.D.   On: 12/18/2020 12:58               LOS: 20 days   Jaisa Defino  [facility]   Pager on [email or web address]. If 7PM-7AM, please contact night-coverage at [email or web address]     12/20/2020, 11:30 AM

## 2020-12-20 NOTE — TOC Progression Note (Signed)
Transition of Care [facility]) - Progression Note    Patient Details  Name: Jane Campbell MRN: [id number] Date of Birth: 1926-07-24  Transition of Care [facility]) CM/SW Contact  Liliana Cline, LCSW Phone Number: 12/20/2020, 1:09 PM  Clinical Narrative:   Asked Inetta Fermo at [facility] if they can still take patient tomorrow 11/14. Waiting on response.          Expected Discharge Plan and Services           Expected Discharge Date: 12/09/20                                     Social Determinants of Health (SDOH) Interventions    Readmission Risk Interventions No flowsheet data found.

## 2020-12-20 NOTE — Progress Notes (Signed)
Tech and I assisted patient to the bedside commode. Patient asked to give her a few minutes. Upon our return to assist patient off the bedside commode, patient states she feels like it is still in there. Wiped patient with a few smears. Upon assessment of patients rectum there is a pinball size protrusion from the left side of her rectum that appears to be blocking. Lurene Shadow, MD has been notified and plans to come take a look. Patient denies pain to her rectum. Requests to sit back in the chair. Assisted patient back to the chair.

## 2020-12-21 DIAGNOSIS — K264 Chronic or unspecified duodenal ulcer with hemorrhage: Secondary | ICD-10-CM | POA: Diagnosis not present

## 2020-12-21 DIAGNOSIS — I4891 Unspecified atrial fibrillation: Secondary | ICD-10-CM | POA: Diagnosis not present

## 2020-12-21 DIAGNOSIS — E876 Hypokalemia: Secondary | ICD-10-CM | POA: Diagnosis not present

## 2020-12-21 LAB — POTASSIUM: Potassium: 4.8 mmol/L (ref 3.5–5.1)

## 2020-12-21 LAB — MAGNESIUM: Magnesium: 2.5 mg/dL — ABNORMAL HIGH (ref 1.7–2.4)

## 2020-12-21 MED ORDER — MIRTAZAPINE 15 MG PO TBDP: 15.0000 mg | ORAL_TABLET | Freq: Every day | ORAL | Status: AC

## 2020-12-21 MED ORDER — DOCUSATE SODIUM 100 MG PO CAPS
100.0000 mg | ORAL_CAPSULE | Freq: Two times a day (BID) | ORAL | Status: DC
Start: 2020-12-21 — End: 2021-01-11

## 2020-12-21 MED ORDER — HYDROCORTISONE (PERIANAL) 2.5 % EX CREA: TOPICAL_CREAM | Freq: Two times a day (BID) | CUTANEOUS | 0 refills | Status: AC

## 2020-12-21 MED ORDER — POLYETHYLENE GLYCOL 3350 17 G PO PACK: 17.0000 g | PACK | Freq: Every day | ORAL | Status: AC

## 2020-12-21 NOTE — Progress Notes (Signed)
Pt to d/c to SNF per MD order, report called to charlie at [facility], pt awaiting EMS for transport.

## 2020-12-21 NOTE — Care Management Important Message (Signed)
Important Message  Patient Details  Name: Jane Campbell MRN: [id number] Date of Birth: March 01, 1926   Medicare Important Message Given:  Yes     Olegario Messier A Callan Norden 12/21/2020, 12:42 PM

## 2020-12-21 NOTE — Discharge Summary (Signed)
Physician Discharge Summary  Jane Campbell [id number] DOB: December 15, 1926 DOA: 11/30/2020  PCP: Pcp, No  Admit date: 11/30/2020 Discharge date: 12/21/2020  Discharge disposition: SNF   Recommendations for Outpatient Follow-Up:   Follow-up with PCP 1 to 2 weeks Schedule follow-up with cardiologist Check BMP and CBC within 1 week of discharge   Discharge Diagnosis:   Principal Problem:   GI bleed Active Problems:   Atrial fibrillation with RVR ([place])   Essential hypertension   Hyperlipidemia   CAD (coronary artery disease)   Edema   Diabetes ([place])   Hypokalemia    Discharge Condition: Stable.  Diet recommendation:  Diet Order             DIET DYS 3 Room service appropriate? Yes; Fluid consistency: Thin  Diet effective now           Diet - low sodium heart healthy                     Code Status: DNR     Hospital Course:   Jane Campbell is a 87 y.o. female with past medical history significant for paroxysmal atrial fibrillation, CAD, type 2 diabetes mellitus, essential hypertension, chronic lower extremity edema, hypothyroidism, recently started on anticoagulation with Eliquis 11/09/2020 who presented to [facility] ED from assisted living facility on 10/24 with complaints of generalized weakness, fatigue, crampy abdominal pain, rectal bleeding with melena/bright red blood.   FOBT was positive.  Eliquis was discontinued and she was treated with IV Protonix.  She underwent EGD on 12/02/2020 which showed nonbleeding duodenal ulcer with nonbleeding visible vessel that was treated with epinephrine injection and bipolar cautery.  She had hypokalemia and hypomagnesemia that were repleted.  She had acute on chronic diastolic CHF requiring treatment with IV Lasix.  She has constipation and hemorrhoids and will need to be on laxatives regularly to help with hemorrhoids.  Discharge to SNF was delayed because screening COVID test came back positive.      Medical Consultants:    Cardiologist Gastroenterologist   Discharge Exam:    Vitals:   12/20/20 0747 12/20/20 1531 12/20/20 2146 12/21/20 0748  BP: 115/76 119/77 123/89 117/90  Pulse: (!) 109 (!) 108 95 (!) 104  Resp: 18 18 20 18   Temp: 98.9 F (37.2 C) 98.9 F (37.2 C) 97.7 F (36.5 C) 98 F (36.7 C)  TempSrc:   Oral Oral  SpO2: 96% 95% 95% 100%  Weight:      Height:         GEN: NAD SKIN: Warm and dry.  Scattered erythematous maculopapular rash on the right side of the back and buttock EYES: No pallor or icterus ENT: MMM CV: Irregular rate and rhythm, mild tachycardia PULM: CTA B ABD: soft, ND, NT, +BS CNS: AAO x 3, non focal EXT: No edema or tenderness   The results of significant diagnostics from this hospitalization (including imaging, microbiology, ancillary and laboratory) are listed below for reference.     Procedures and Diagnostic Studies:   CT ANGIO GI BLEED  Result Date: 11/30/2020 CLINICAL DATA:  GI bleed. EXAM: CTA ABDOMEN AND PELVIS WITHOUT AND WITH CONTRAST TECHNIQUE: Multidetector CT imaging of the abdomen and pelvis was performed using the standard protocol during bolus administration of intravenous contrast. Multiplanar reconstructed images and MIPs were obtained and reviewed to evaluate the vascular anatomy. CONTRAST:  136mL OMNIPAQUE IOHEXOL 350 MG/ML SOLN COMPARISON:  None. FINDINGS: VASCULAR Aorta: Moderate atherosclerotic calcification. No aneurysmal dilatation or dissection. No  periaortic fluid or hematoma. There is a focal area of penetrating ulcer involving the right lateral wall of the infrarenal abdominal aorta measuring approximately 7 mm in depth. Celiac: Atherosclerotic calcification of the origin of the celiac axis. The celiac axis and its major branches are patent. SMA: Patent without evidence of aneurysm, dissection, vasculitis or significant stenosis. Renals: Atherosclerotic calcification of the origin of the right renal artery. The right renal artery  remains patent. IMA: Patent without evidence of aneurysm, dissection, vasculitis or significant stenosis. Inflow: Atherosclerotic calcification of the iliac arteries. No aneurysmal dilatation or dissection. Proximal Outflow: Bilateral common femoral and visualized portions of the superficial and profunda femoral arteries are patent without evidence of aneurysm, dissection, vasculitis or significant stenosis. Veins: No obvious venous abnormality within the limitations of this arterial phase study. Review of the MIP images confirms the above findings. NON-VASCULAR Lower chest: Partially visualized moderate right pleural effusion. No intra-abdominal free air or free fluid. Hepatobiliary: The liver is unremarkable. No intrahepatic biliary dilatation. Multiple stones. No pericholecystic fluid or evidence of acute cholecystitis by CT. Pancreas: Indeterminate faint 11 mm hypodense lesion in the distal pancreas which is not characterized on this CT, possibly a side branch IPMN. No acute inflammatory changes. No dilatation of the main pancreatic duct or gland atrophy. Spleen: Normal in size without focal abnormality. Adrenals/Urinary Tract: The right adrenal gland is unremarkable. 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 No hydronephrosis. Several small right renal cysts. The urinary bladder is grossly unremarkable. Stomach/Bowel: Diffuse diverticulosis of the descending colon and severe sigmoid diverticulosis without active inflammatory changes. No bowel obstruction or active inflammation. There is a large hiatal hernia. No extravasation of contrast to suggest active GI bleed. Appendectomy. Lymphatic: No adenopathy. Reproductive: Hysterectomy. Other: Faint 2.3 x 1.3 cm focal area of high attenuating prominence in the right rectus muscle which may represent focal area of muscular prominence versus a small hematoma (77/5). Musculoskeletal: Osteopenia with degenerative changes of the spine and  scoliosis. No acute osseous pathology. IMPRESSION: 1. No evidence of active GI bleed. 2. Severe colonic diverticulosis without active inflammatory changes. 3. Large hiatal hernia. 4. Cholelithiasis. 5. Solitary right kidney. 6. Partially visualized moderate right pleural effusion. Electronically Signed   By: Anner Crete M.D.   On: 11/30/2020 23:45     Labs:   Basic Metabolic Panel: Recent Labs  Lab 12/16/20 0810 12/17/20 0633 12/18/20 0608 12/19/20 0453 12/20/20 0441 12/21/20 0749  NA 135 137 136 136 137  --   K 4.6 4.2 3.3* 3.2* 3.3* 4.8  CL 101 104 101 100 101  --   CO2 27 28 29  32 31  --   GLUCOSE 90 104* 94 129* 136*  --   BUN 22 23 23  24* 25*  --   CREATININE 1.35* 1.22* 1.12* 1.21* 1.10*  --   CALCIUM 8.5* 8.6* 8.1* 8.2* 8.1*  --   MG  --   --  1.8 2.2 1.9 2.5*   GFR Estimated Creatinine Clearance: 31.5 mL/min (A) (by C-G formula based on SCr of 1.1 mg/dL (H)). Liver Function Tests: No results for input(s): AST, ALT, ALKPHOS, BILITOT, PROT, ALBUMIN in the last 168 hours. No results for input(s): LIPASE, AMYLASE in the last 168 hours. No results for input(s): AMMONIA in the last 168 hours. Coagulation profile No results for input(s): INR, PROTIME in the last 168 hours.  CBC: Recent Labs  Lab 12/16/20 0810 12/17/20 0633  WBC 6.8 7.1  NEUTROABS 5.0 5.0  HGB 8.9* 8.9*  HCT 29.9* 28.7*  MCV 100.0 99.3  PLT 56* 184   Cardiac Enzymes: No results for input(s): CKTOTAL, CKMB, CKMBINDEX, TROPONINI in the last 168 hours. BNP: Invalid input(s): POCBNP CBG: No results for input(s): GLUCAP in the last 168 hours. D-Dimer No results for input(s): DDIMER in the last 72 hours. Hgb A1c No results for input(s): HGBA1C in the last 72 hours. Lipid Profile No results for input(s): CHOL, HDL, LDLCALC, TRIG, CHOLHDL, LDLDIRECT in the last 72 hours. Thyroid function studies No results for input(s): TSH, T4TOTAL, T3FREE, THYROIDAB in the last 72 hours.  Invalid input(s):  FREET3 Anemia work up No results for input(s): VITAMINB12, FOLATE, FERRITIN, TIBC, IRON, RETICCTPCT in the last 72 hours. Microbiology No results found for this or any previous visit (from the past 240 hour(s)).   Discharge Instructions:   Discharge Instructions     Call MD for:  difficulty breathing, headache or visual disturbances   Complete by: As directed    Diet - low sodium heart healthy   Complete by: As directed    Discharge wound care:   Complete by: As directed    Keep perineum clean and dry   Increase activity slowly   Complete by: As directed    Increase activity slowly   Complete by: As directed    No wound care   Complete by: As directed       Allergies as of 12/21/2020       Reactions   Penicillins Rash   States when PCN first came out; does not remember if she tried PCN since        Medication List     STOP taking these medications    apixaban 2.5 MG Tabs tablet Commonly known as: ELIQUIS   diltiazem 120 MG 24 hr capsule Commonly known as: CARDIZEM CD   loperamide 2 MG capsule Commonly known as: IMODIUM   metoprolol succinate 50 MG 24 hr tablet Commonly known as: TOPROL-XL       TAKE these medications    acetaminophen 325 MG tablet Commonly known as: TYLENOL Take 650 mg by mouth 3 (three) times daily as needed for mild pain.   albuterol 108 (90 Base) MCG/ACT inhaler Commonly known as: VENTOLIN HFA Inhale 2 puffs into the lungs every 6 (six) hours as needed for wheezing or shortness of breath.   ALPRAZolam 0.25 MG tablet Commonly known as: XANAX Take 1 tablet (0.25 mg total) by mouth 3 (three) times daily as needed for anxiety.   amiodarone 200 MG tablet Commonly known as: PACERONE Take 1 tablet (200 mg total) by mouth 2 (two) times daily.   atorvastatin 10 MG tablet Commonly known as: LIPITOR Take 10 mg by mouth at bedtime.   Cranberry 500 MG Caps Take 500 mg by mouth daily.   docusate sodium 100 MG capsule Commonly  known as: COLACE Take 1 capsule (100 mg total) by mouth 2 (two) times daily.   fluticasone 50 MCG/ACT nasal spray Commonly known as: FLONASE Place 2 sprays into both nostrils daily as needed for allergies or rhinitis.   furosemide 40 MG tablet Commonly known as: LASIX Take 40 mg by mouth daily.   hydrocortisone 2.5 % rectal cream Commonly known as: ANUSOL-HC Place rectally 2 (two) times daily for 4 days.   levothyroxine 125 MCG tablet Commonly known as: SYNTHROID Take 1 tablet (125 mcg total) by mouth daily before breakfast.   Metoprolol Tartrate 75 MG Tabs Take 75 mg by mouth  2 (two) times daily.   mirtazapine 15 MG disintegrating tablet Commonly known as: REMERON SOL-TAB Take 1 tablet (15 mg total) by mouth at bedtime.   multivitamin with minerals tablet Take 1 tablet by mouth daily.   ondansetron 4 MG tablet Commonly known as: ZOFRAN Take 4 mg by mouth 2 (two) times daily as needed for nausea or vomiting.   pantoprazole 40 MG tablet Commonly known as: PROTONIX Take 1 tablet (40 mg total) by mouth 2 (two) times daily.   polyethylene glycol 17 g packet Commonly known as: MIRALAX / GLYCOLAX Take 17 g by mouth daily. Start taking on: December 22, 2020   potassium chloride SA 20 MEQ tablet Commonly known as: KLOR-CON Take 1 tablet (20 mEq total) by mouth daily.   senna 8.6 MG tablet Commonly known as: SENOKOT Take 2 tablets by mouth at bedtime.   sucralfate 1 GM/10ML suspension Commonly known as: CARAFATE Take 10 mLs (1 g total) by mouth 4 (four) times daily -  with meals and at bedtime.               Discharge Care Instructions  (From admission, onward)           Start     Ordered   12/21/20 0000  Discharge wound care:       Comments: Keep perineum clean and dry   12/21/20 1132            Contact information for follow-up providers     [name], MD. Go on 12/29/2020.   Specialty: Cardiology Why: 11am appointment Contact  information: [address] [facility] [place] [address] [phone]              Contact information for after-discharge care     Destination     [facility] Preferred SNF .   Service: Skilled Nursing Contact information: [address] [place] [address] [phone]                       If you experience worsening of your admission symptoms, develop shortness of breath, life threatening emergency, suicidal or homicidal thoughts you must seek medical attention immediately by calling 911 or calling your MD immediately  if symptoms less severe.   You must read complete instructions/literature along with all the possible adverse reactions/side effects for all the medicines you take and that have been prescribed to you. Take any new medicines after you have completely understood and accept all the possible adverse reactions/side effects.    Please note   You were cared for by a hospitalist during your hospital stay. If you have any questions about your discharge medications or the care you received while you were in the hospital after you are discharged, you can call the unit and asked to speak with the hospitalist on call if the hospitalist that took care of you is not available. Once you are discharged, your primary care physician will handle any further medical issues. Please note that NO REFILLS for any discharge medications will be authorized once you are discharged, as it is imperative that you return to your primary care physician (or establish a relationship with a primary care physician if you do not have one) for your aftercare needs so that they can reassess your need for medications and monitor your lab values.       Time coordinating discharge: 32 minutes  Signed:  Marcelia Petersen  [facility] 12/21/2020, 11:32 AM   Pager on [email or web address]. If 7PM-7AM, please contact  night-coverage at [email or web address]

## 2020-12-21 NOTE — TOC Transition Note (Signed)
Transition of Care [facility]) - CM/SW Discharge Note   Patient Details  Name: Adlene Adduci MRN: [id number] Date of Birth: 22-Mar-1926  Transition of Care [facility]) CM/SW Contact:  Chapman Fitch, RN Phone Number: 12/21/2020, 2:15 PM   Clinical Narrative:     Patient will DC OL:IDCV Anticipated DC date:12/21/20  Family notified:Daughter Transport by:EMS  Per MD patient ready for DC to . RN, patient, patient's family, and facility notified of DC. Discharge Summary sent to facility. RN given number for report. DC packet on chart. Ambulance transport requested for patient.  TOC signing off.  Bevelyn Ngo [facility] [phone]   Final next level of care: Skilled Nursing Facility Barriers to Discharge: Barriers Resolved   Patient Goals and CMS Choice        Discharge Placement              Patient chooses bed at: [facility] [place] Patient to be transferred to facility by: EMS Name of family member notified: daughter Patient and family notified of of transfer: 12/21/20  Discharge Plan and Services                                     Social Determinants of Health (SDOH) Interventions     Readmission Risk Interventions No flowsheet data found.

## 2021-01-11 ENCOUNTER — Non-Acute Institutional Stay: Payer: Medicare Other | Admitting: Primary Care

## 2021-01-11 ENCOUNTER — Encounter: Payer: Self-pay | Admitting: Primary Care

## 2021-01-11 ENCOUNTER — Other Ambulatory Visit: Payer: Self-pay

## 2021-01-11 VITALS — Ht 63.0 in | Wt 140.0 lb

## 2021-01-11 DIAGNOSIS — R609 Edema, unspecified: Secondary | ICD-10-CM

## 2021-01-11 DIAGNOSIS — I2510 Atherosclerotic heart disease of native coronary artery without angina pectoris: Secondary | ICD-10-CM

## 2021-01-11 DIAGNOSIS — I4891 Unspecified atrial fibrillation: Secondary | ICD-10-CM

## 2021-01-11 DIAGNOSIS — Z515 Encounter for palliative care: Secondary | ICD-10-CM

## 2021-01-11 NOTE — Progress Notes (Signed)
[facility] Consult Note Telephone: [phone]  Fax: [phone]   Date of encounter: 01/11/21 10:20 AM PATIENT NAME: Jane Campbell Jane Campbell [id number]   [phone] (home)  DOB: 1926-08-13 MRN: [id number] PRIMARY CARE PROVIDER:    Lesia Hausen, MD,  [address] [place] [id number] [phone]  REFERRING PROVIDER:  . Liz Malady  FNP  RESPONSIBLE PARTY:    Contact Information     Name Relation Home Work Mobile   Zanker,Nerine Daughter   [phone]        I met face to face with patient and family in  home/facility. Palliative Care was asked to follow this patient by consultation request of  Liz Malady FNP  to address advance care planning and complex medical decision making. This is the initial visit.                                     ASSESSMENT AND PLAN / RECOMMENDATIONS:   Advance Care Planning/Goals of Care: Goals include to maximize quality of life and symptom management. Patient/health care surrogate gave his/her permission to discuss.Our advance care planning conversation included a discussion about:    The value and importance of advance care planning  Experiences with loved ones who have been seriously ill or have died  Exploration of personal, cultural or spiritual beliefs that might influence medical decisions  Exploration of goals of care in the event of a sudden injury or illness  Identification of a healthcare agent - Daughter Jane Campbell Review  of an  advance directive document . Decision  to  de-escalate disease focused treatments due to poor prognosis. CODE STATUS: DNR Spoke with patient and POA RE her wishes. She desires to return to her ALF home and enroll in [facility] services. She realizes she is not getting better, has increased dyspnea and nausea. She wants to be kept comfortable. Daughters states pt husband and her father passed away 50 mos ago.  I  reviewed a MOST form today. The patient and family outlined their wishes for the following treatment decisions:  Cardiopulmonary Resuscitation: Do Not Attempt Resuscitation (DNR/No CPR)  Medical Interventions: Comfort Measures: Keep clean, warm, and dry. Use medication by any route, positioning, wound care, and other measures to relieve pain and suffering. Use oxygen, suction and manual treatment of airway obstruction as needed for comfort. Do not transfer to the hospital unless comfort needs cannot be met in current location.  Antibiotics: Determine use of limitation of antibiotics when infection occurs  IV Fluids: IV fluids for a defined trial period  Feeding Tube: No feeding tube    I spent 20 minutes providing this consultation. More than 50% of the time in this consultation was spent in counseling and care coordination. ----------------------------------------------------------------------------------------------------------------- Symptom Management/Plan:  Recent Infections: Has chronic bladder infections and takes prophylaxis.Finishing course of macrobid for UTI today. 2 recent pneumonias and covid infection in past month with very productive cough. Labs and imaging reviewed from hospital stay in 10/22 indicating compressed atelectasis. Poor air movement.  Nutrition: Has not been eating well, has < 25% eaten for breakfast. Recommend supplements, magic cup. Pt endorses 25 lb loss in 4 mos, 15%, and ageusia.Endorses nausea. I will order ondansetron ODT 85 mg q 8 hrs po, with 85 mg po bid prn remaining. T wants to d/c nutritional supplements due to pill burden and  poly pharmacy. I have d/c statin, d/c cranberry, MVI, and Vitamin C.  Albumin 2.6  Constipation: I d/c colace and senna and wrote for combo to decrease pill burden, senna-s 8.6 mg/ 50 mg po bid at hs. Continue to monitor.  Dyspnea at rest: Not moving any air in bases, needs nebulizer Rx, which I will order. Albuterol 2.5/0.083% qid per  nebulizers, has prn HFA remaining.Using accessory muscles and cannot speak fully due to DOE.  Follow up Palliative Care Visit: Palliative care will continue to follow for complex medical decision making, advance care planning, and clarification of goals. Return 1 weeks or prn. Want hospice admission asap, so will work on that.  This visit was coded based on medical decision making (MDM).  PPS: 30%  HOSPICE ELIGIBILITY/DIAGNOSIS: yes/wt loss, pneumonia  Chief Complaint: respiratory compromise, delirium  HISTORY OF PRESENT ILLNESS:  Jane Campbell is a 87 y.o. year old female  with recent history of GI bleed, CAD, edema, atrial fibrillation not on anticoagulation, h/o chronic UTI, DM, hypokalemia. Went to ED for gi bleed. Contracted pneumonia and COVID 19 infections,becoming oxygen dependent. Was transferred to SNF where she has done rehab but has gotten progressively weaker and less functional. Before infection could ambulate (I) with walker > 50', now can ambulate < 10 feet with assistance and walker. Requests to d/c to ALF on hospice services as she feels she is declining and is at EOL.  History obtained from review of EMR, discussion with primary team, and interview with family, facility staff/caregiver and/or Ms. Ronnald Collum.  I reviewed available labs, medications, imaging, studies and related documents from the EMR.  Records reviewed and summarized above.   ROS  General: dyspnea EYES: denies vision changes, uses glasses  ENMT: denies dysphagia Cardiovascular: denies chest pain, endorses  DOE Pulmonary:  endorses  productive cough, endorses increased SOB Abdomen: endorses poor  appetite, endorses constipation, endorses continence of bowel GU: denies dysuria, endorses continence of urine MSK:  endorses weakness,  no falls reported, uses walker Skin: staff endorses L heel ulcer stage 2. Neurological: denies pain, denies insomnia, endorses peripheral neuropathy at times Psych: Endorses positive  mood Heme/lymph/immuno: denies bruises, abnormal bleeding  Physical Exam: Current and past weights: 140 lbs, Body mass index is 24.8 kg/m. Constitutional: 117/65 HR 116 RR 24 General: frail appearing, thin EYES: anicteric sclera, lids intact, no discharge  ENMT: intact hearing, oral mucous membranes moist, dentition intact CV: S1S2, tachycardia,2+ bil  LE edema Pulmonary: no air movement in bases, moves air from mid lobes to apices, ++ increased work of breathing, ++ cough, oxygen 2 L / [place] Abdomen: intake <25%, normo-active BS + 4 quadrants, soft and non tender, no ascites GU: deferred MSK: ++ sarcopenia, moves all extremities, ambulatory with walker  and 1-2 assist Skin: warm and dry, no rashes or wounds on visible skin,Heel wound reported Neuro:  no generalized weakness,  no cognitive impairment Psych: slight anxious affect, A and O x 3 Hem/lymph/immuno: no widespread bruising CURRENT PROBLEM LIST:  Patient Active Problem List   Diagnosis Date Noted   Acute GI bleeding    GI bleed 11/30/2020   Edema 11/30/2020   Diabetes ([place]) 11/30/2020   Hypokalemia 11/30/2020   Atrial fibrillation with RVR ([place]) 11/03/2020   Essential hypertension 11/03/2020   Pyuria 11/03/2020   Hyperlipidemia 11/03/2020   CAD (coronary artery disease) 11/03/2020   PAST MEDICAL HISTORY:  Active Ambulatory Problems    Diagnosis Date Noted   Atrial fibrillation with RVR ([place]) 11/03/2020  Essential hypertension 11/03/2020   Pyuria 11/03/2020   Hyperlipidemia 11/03/2020   CAD (coronary artery disease) 11/03/2020   GI bleed 11/30/2020   Edema 11/30/2020   Diabetes ([place]) 11/30/2020   Hypokalemia 11/30/2020   Acute GI bleeding    Resolved Ambulatory Problems    Diagnosis Date Noted   No Resolved Ambulatory Problems   Past Medical History:  Diagnosis Date   A-fib [facility])    Coronary artery disease    Diabetes mellitus without complication ([place])    Hypertension    SOCIAL HX:  Social History    Tobacco Use   Smoking status: Former    Types: Cigarettes    Quit date: 02/07/1962    Years since quitting: 58.9   Smokeless tobacco: Former  Substance Use Topics   Alcohol use: Not Currently   FAMILY HX: History reviewed. No pertinent family history.  ALLERGIES:  Allergies  Allergen Reactions   Penicillins Rash    States when PCN first came out; does not remember if she tried PCN since     PERTINENT MEDICATIONS:  Outpatient Encounter Medications as of 01/11/2021  Medication Sig   acetaminophen (TYLENOL) 325 MG tablet Take 650 mg by mouth 3 (three) times daily as needed for mild pain.   albuterol (PROVENTIL) (2.5 MG/3ML) 0.083% nebulizer solution Take 2.5 mg by nebulization 4 (four) times daily.   albuterol (VENTOLIN HFA) 108 (90 Base) MCG/ACT inhaler Inhale 2 puffs into the lungs every 6 (six) hours as needed for wheezing or shortness of breath.   ondansetron (ZOFRAN-ODT) 4 MG disintegrating tablet Take 4 mg by mouth every 8 (eight) hours.   sennosides-docusate sodium (SENOKOT-S) 8.6-50 MG tablet Take 2 tablets by mouth daily.   ALPRAZolam (XANAX) 0.25 MG tablet Take 1 tablet (0.25 mg total) by mouth 3 (three) times daily as needed for anxiety. (Patient not taking: Reported on 01/11/2021)   fluticasone (FLONASE) 50 MCG/ACT nasal spray Place 2 sprays into both nostrils daily as needed for allergies or rhinitis.   furosemide (LASIX) 40 MG tablet Take 40 mg by mouth daily.   levothyroxine (SYNTHROID) 125 MCG tablet Take 1 tablet (125 mcg total) by mouth daily before breakfast.   metoprolol tartrate 75 MG TABS Take 75 mg by mouth 2 (two) times daily.   mirtazapine (REMERON SOL-TAB) 15 MG disintegrating tablet Take 1 tablet (15 mg total) by mouth at bedtime.   ondansetron (ZOFRAN) 4 MG tablet Take 4 mg by mouth 2 (two) times daily as needed for nausea or vomiting.   pantoprazole (PROTONIX) 40 MG tablet Take 1 tablet (40 mg total) by mouth 2 (two) times daily.   polyethylene glycol  (MIRALAX / GLYCOLAX) 17 g packet Take 17 g by mouth daily.   potassium chloride SA (KLOR-CON) 20 MEQ tablet Take 1 tablet (20 mEq total) by mouth daily.   sucralfate (CARAFATE) 1 GM/10ML suspension Take 10 mLs (1 g total) by mouth 4 (four) times daily -  with meals and at bedtime.   [DISCONTINUED] amiodarone (PACERONE) 200 MG tablet Take 1 tablet (200 mg total) by mouth 2 (two) times daily.   [DISCONTINUED] atorvastatin (LIPITOR) 10 MG tablet Take 10 mg by mouth at bedtime.   [DISCONTINUED] Cranberry 500 MG CAPS Take 500 mg by mouth daily.   [DISCONTINUED] docusate sodium (COLACE) 100 MG capsule Take 1 capsule (100 mg total) by mouth 2 (two) times daily.   [DISCONTINUED] Multiple Vitamins-Minerals (MULTIVITAMIN WITH MINERALS) tablet Take 1 tablet by mouth daily.   [DISCONTINUED] senna (SENOKOT) 8.6  MG tablet Take 2 tablets by mouth at bedtime.   No facility-administered encounter medications on file as of 01/11/2021.    Thank you for the opportunity to participate in the care of Ms. Ronnald Collum.  The palliative care team will continue to follow. Please call our office at [phone] if we can be of additional assistance.   Jason Coop, NP , DNP, AGPCNP-BC  COVID-19 PATIENT SCREENING TOOL Asked and negative response unless otherwise noted:  Have you had symptoms of covid, tested positive or been in contact with someone with symptoms/positive test in the past 5-10 days?

## 2021-02-07 DEATH — deceased

## 2022-10-20 IMAGING — DX DG CHEST 1V PORT
1 series · 1 of 1 positions shown · non-contrast
Comparison: None.

CLINICAL DATA: Rapid AFib

EXAM:
PORTABLE CHEST 1 VIEW

[chest ap]
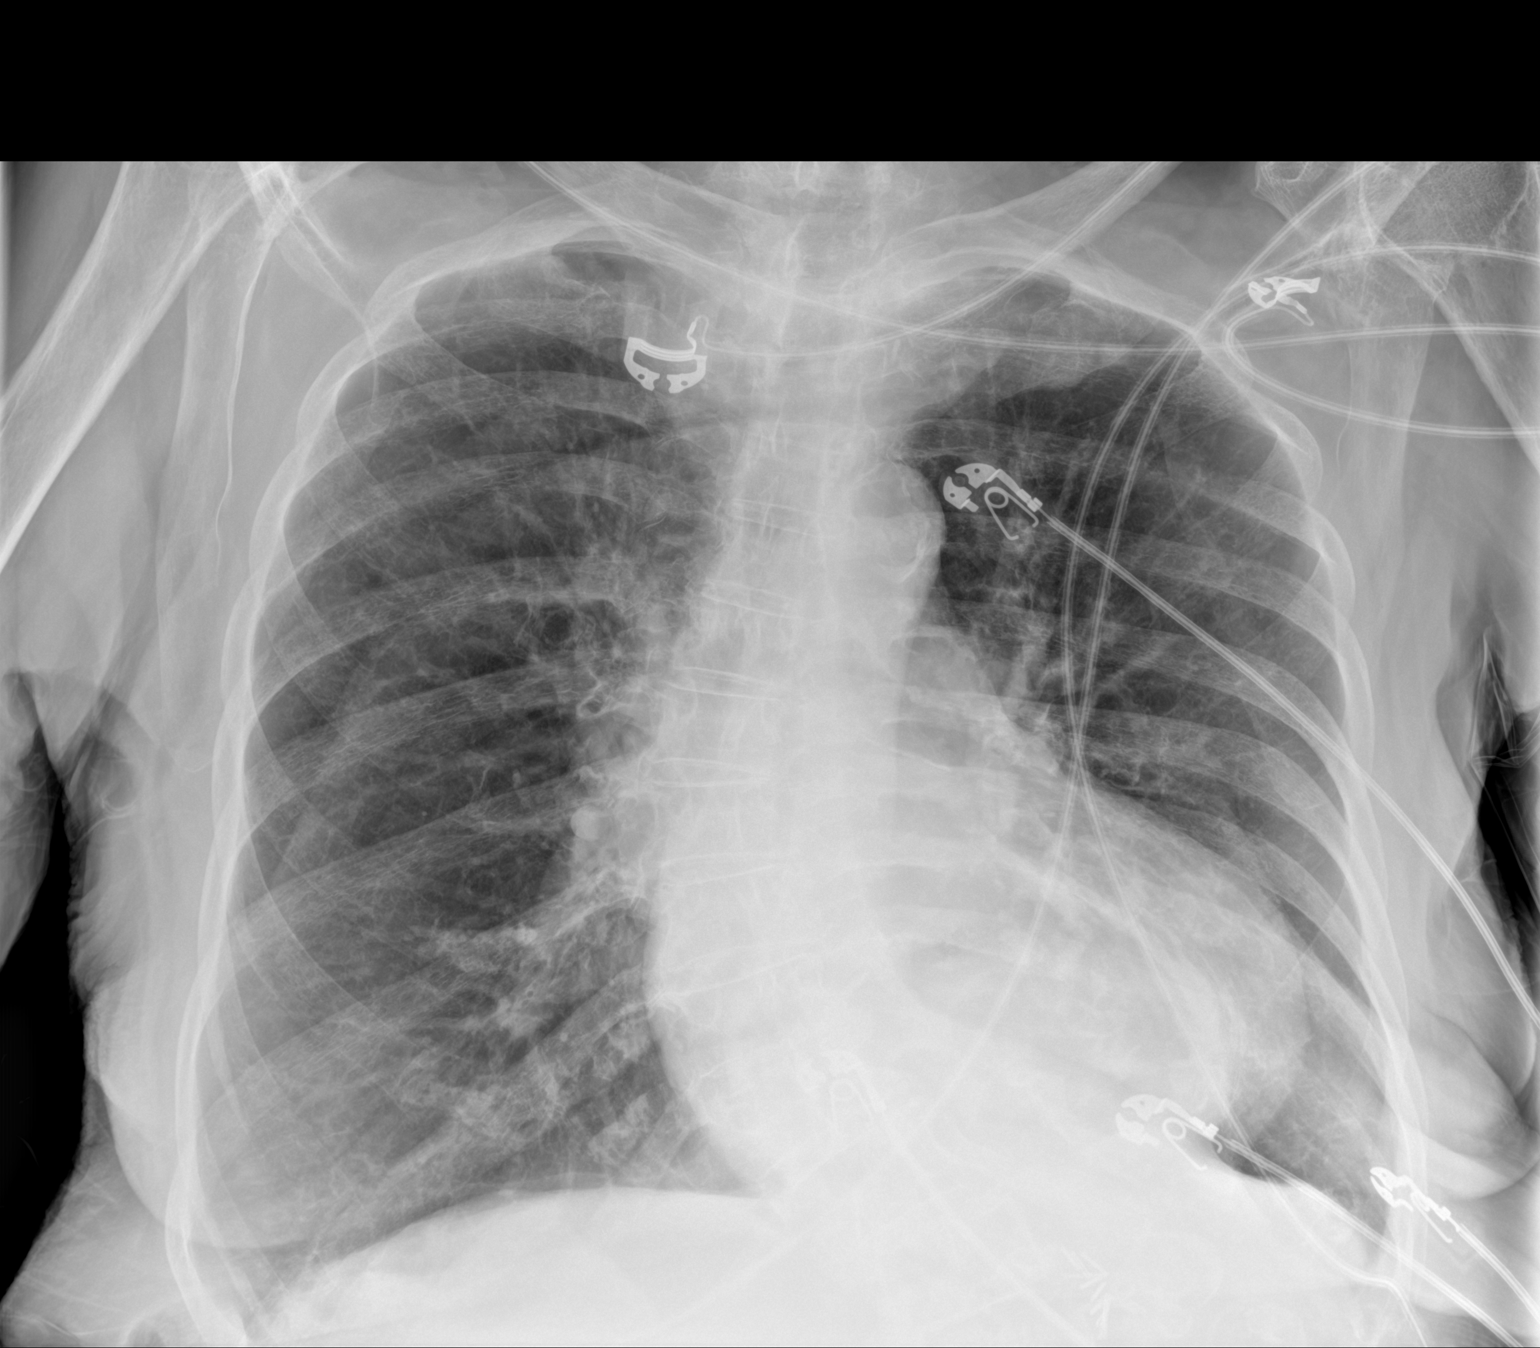

[1 of 1 positions shown; findings below may reference images not displayed]

FINDINGS: Cardiac contours within normal limits for AP technique. Rounded
opacity with central lucency overlying the mediastinum, likely a
moderate hiatal hernia. Mild scattered linear opacities, likely
atelectasis. No focal consolidation. No large pleural effusion or
evidence of pneumothorax. Moderate scoliosis.
IMPRESSION: No focal consolidation.

Rounded opacity with central lucency overlying the mediastinum,
likely a moderate hiatal hernia.

## 2022-10-20 IMAGING — US US EXTREM LOW VENOUS
1 series · 13 of 24 positions shown · non-contrast
Comparison: None.

CLINICAL DATA: [AGE] female with leg swelling



[Series 1: us venous img lower bilat (dvt) · portal-venous · 13 of 73 slices shown]
[im 1/73]
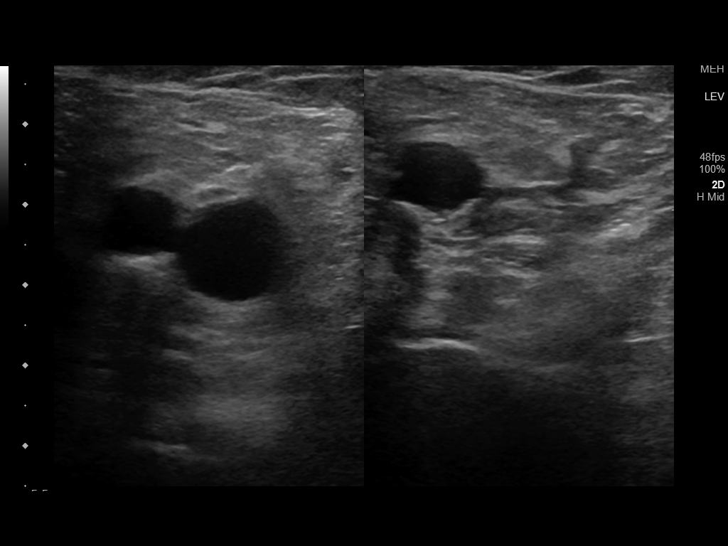
[im 7/73]
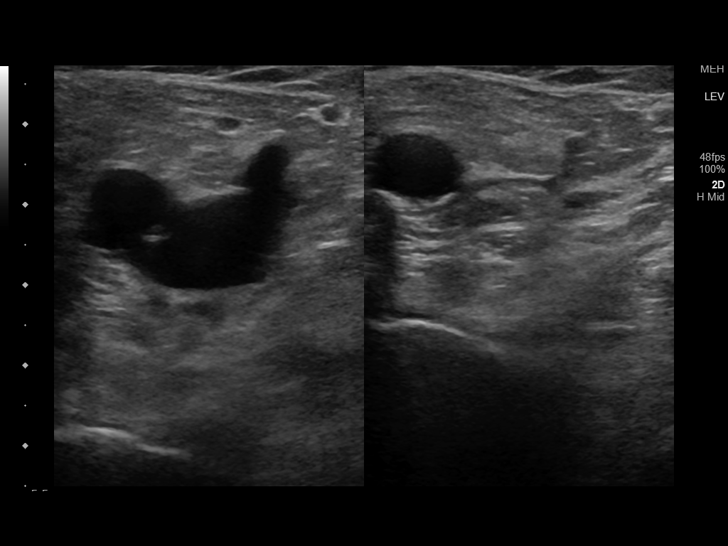
[im 13/73]
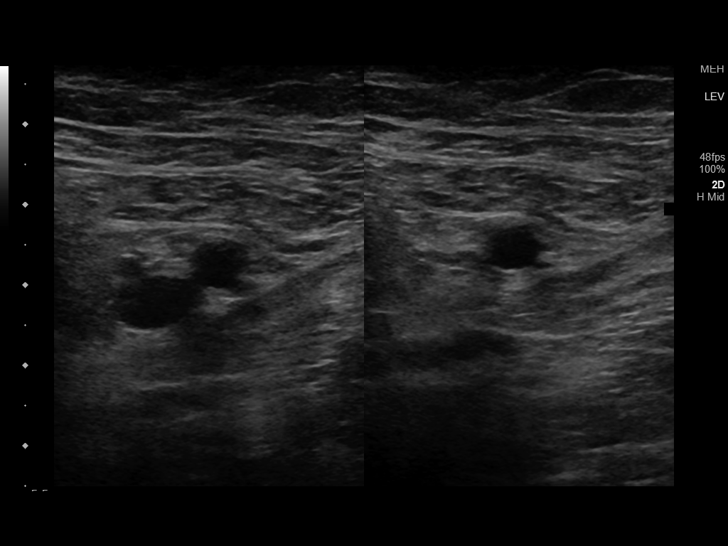
[im 19/73]
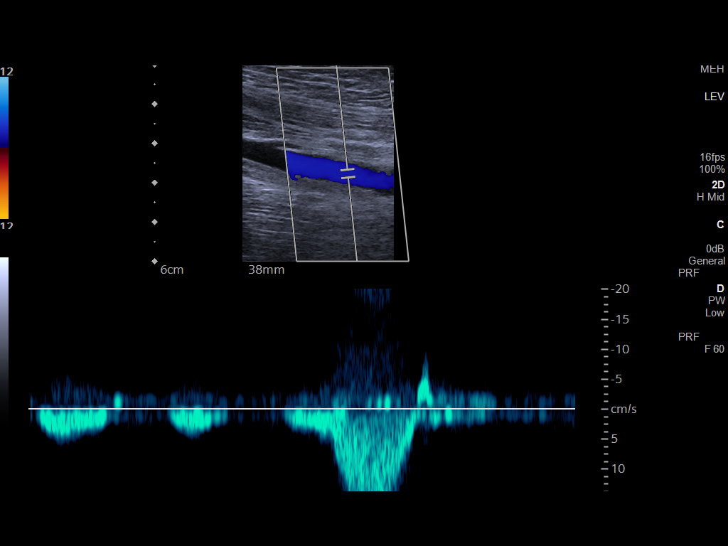
[im 26/73]
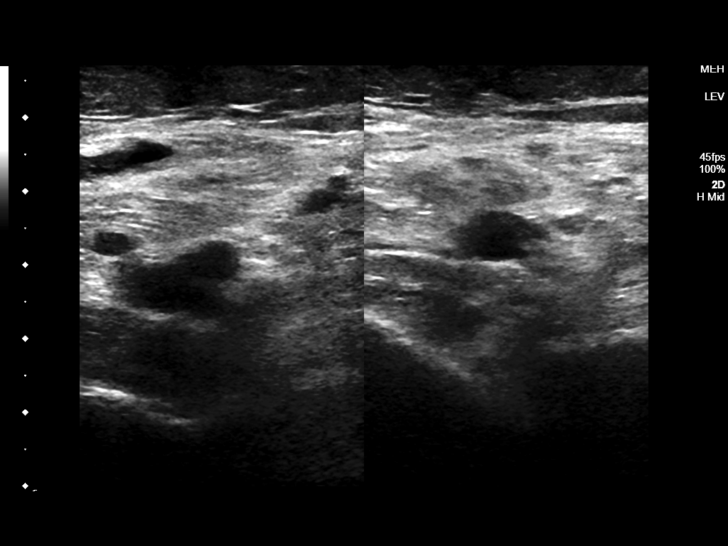
[im 32/73]
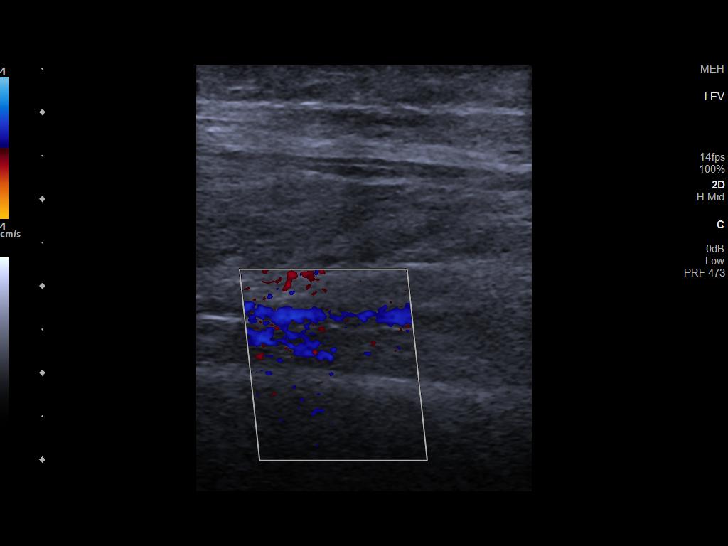
[im 38/73]
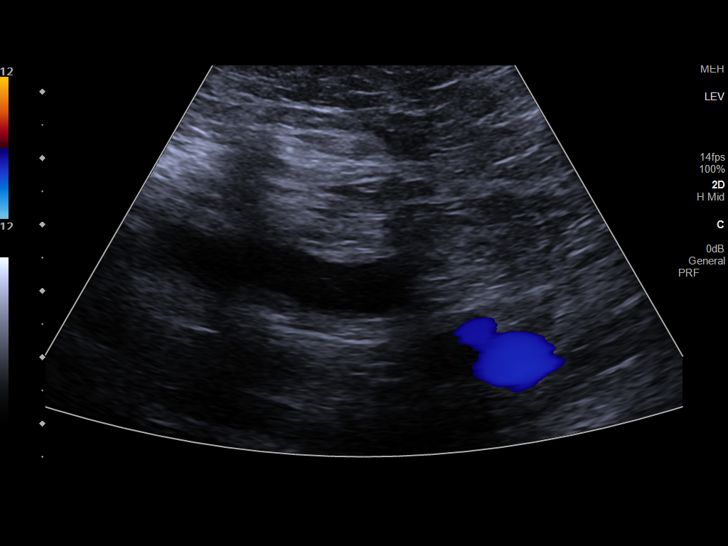
[im 41/73]
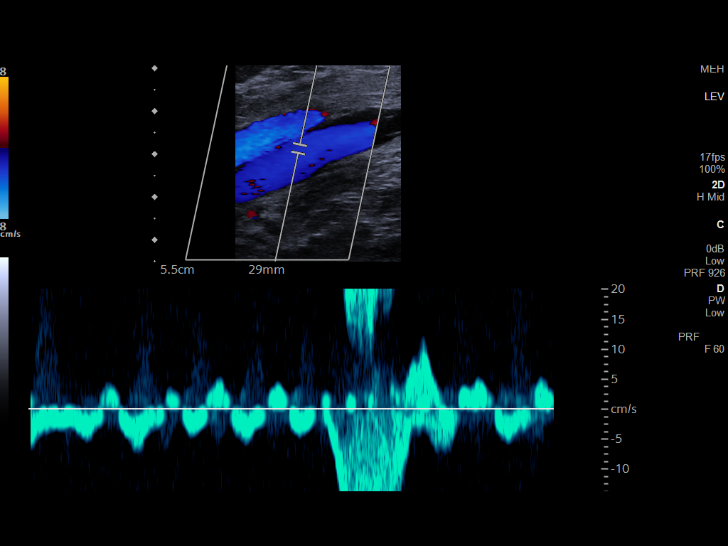
[im 47/73]
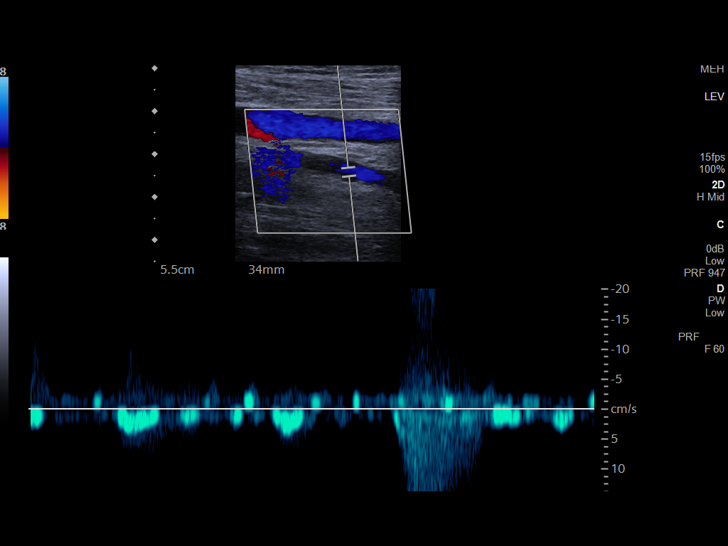
[im 54/73]
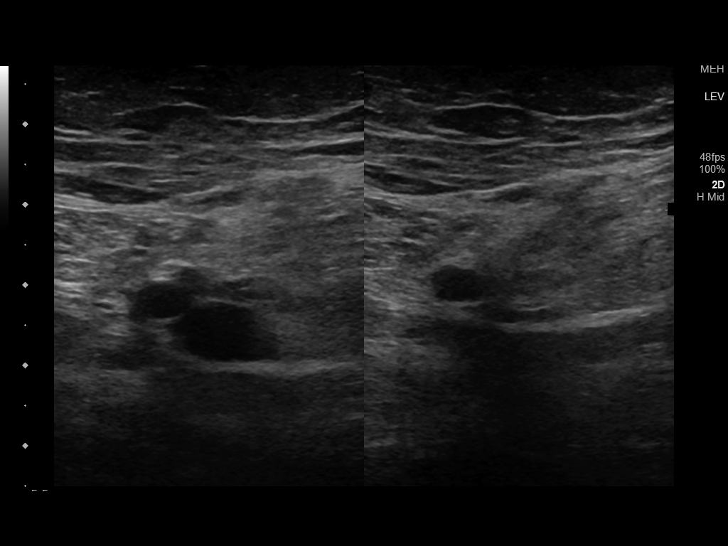
[im 60/73]
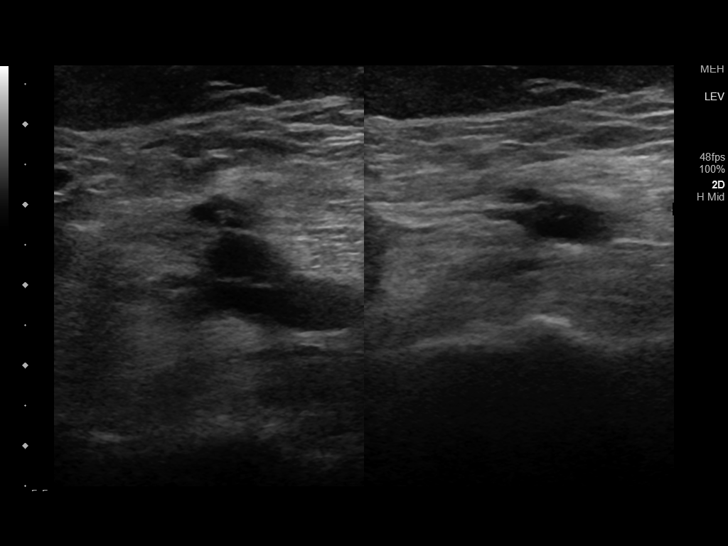
[im 66/73]
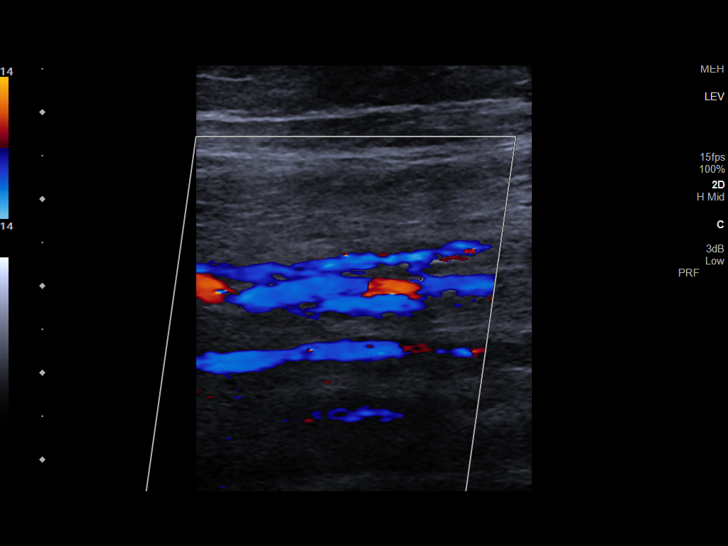
[im 73/73]
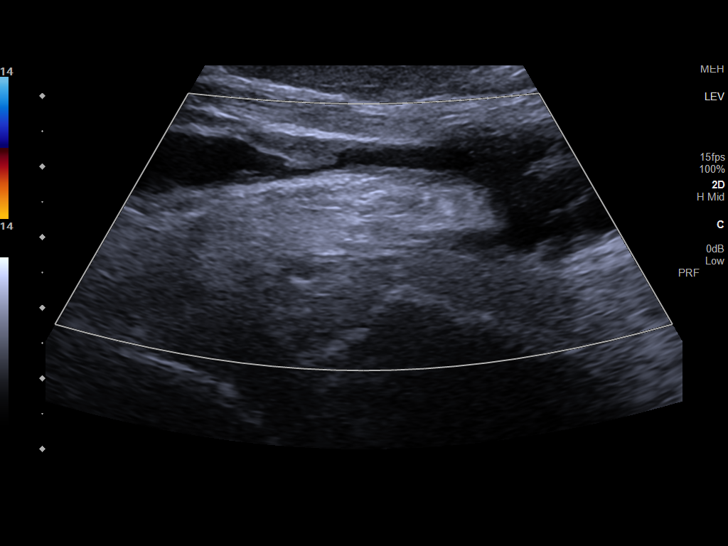

[13 of 24 positions shown; findings below may reference images not displayed]

FINDINGS: RIGHT LOWER EXTREMITY

Common Femoral Vein: No evidence of thrombus. Normal
compressibility, respiratory phasicity and response to augmentation.

Saphenofemoral Junction: No evidence of thrombus. Normal
compressibility and flow on color Doppler imaging.

Profunda Femoral Vein: No evidence of thrombus. Normal
compressibility and flow on color Doppler imaging.

Femoral Vein: No evidence of thrombus. Normal compressibility,
respiratory phasicity and response to augmentation.

Popliteal Vein: No evidence of thrombus. Normal compressibility,
respiratory phasicity and response to augmentation.

Calf Veins: No evidence of thrombus. Normal compressibility and flow
on color Doppler imaging.

Superficial Great Saphenous Vein: No evidence of thrombus. Normal
compressibility and flow on color Doppler imaging.

Other Findings: Anechoic fluid collection in the popliteal fossa
measuring 4.4 cm x 0.9 cm x 3.4 cm

LEFT LOWER EXTREMITY

Common Femoral Vein: No evidence of thrombus. Normal
compressibility, respiratory phasicity and response to augmentation.

Saphenofemoral Junction: No evidence of thrombus. Normal
compressibility and flow on color Doppler imaging.

Profunda Femoral Vein: No evidence of thrombus. Normal
compressibility and flow on color Doppler imaging.

Femoral Vein: No evidence of thrombus. Normal compressibility,
respiratory phasicity and response to augmentation.

Popliteal Vein: No evidence of thrombus. Normal compressibility,
respiratory phasicity and response to augmentation.

Calf Veins: No evidence of thrombus. Normal compressibility and flow
on color Doppler imaging.

Superficial Great Saphenous Vein: No evidence of thrombus. Normal
compressibility and flow on color Doppler imaging.

Other Findings: Anechoic fluid collection in the popliteal fossa
measuring 7.7 cm x 2.9 cm x 1.3 cm.
IMPRESSION: Sonographic survey of the bilateral lower extremities negative for
DVT.

Bilateral Baker's cyst

## 2022-11-16 IMAGING — CT CTA GI BLEED
3 of 10 series · 11 of 46 positions shown, 17 images · IV contrast (omnipaque)
Comparison: None.

CLINICAL DATA: GI bleed.

EXAM:
CTA ABDOMEN AND PELVIS WITHOUT AND WITH CONTRAST
TECHNIQUE: Multidetector CT imaging of the abdomen and pelvis was performed
using the standard protocol during bolus administration of
intravenous contrast. Multiplanar reconstructed images and MIPs were
obtained and reviewed to evaluate the vascular anatomy.
CONTRAST:  100mL OMNIPAQUE IOHEXOL 350 MG/ML SOLN

[Series 5: axial arterial · axial · arterial · 0.96mm/px · z∈[-1225,-1015]mm · 5 of 212 slices shown]
[im 16/212  soft-tissue]
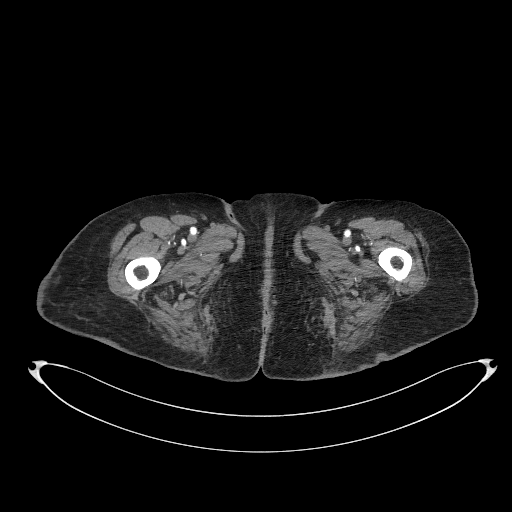
[im 46/212  soft-tissue]
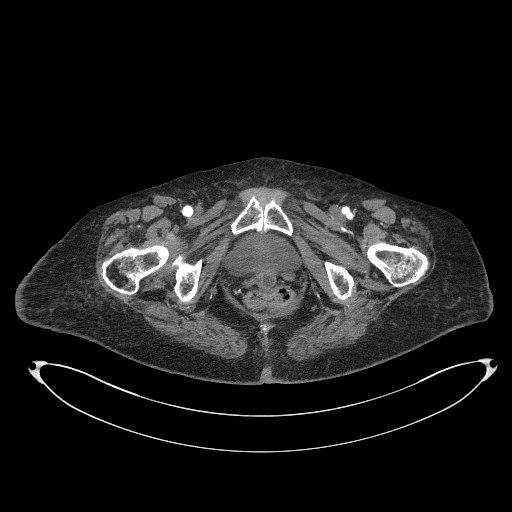
[im 76/212  soft-tissue]
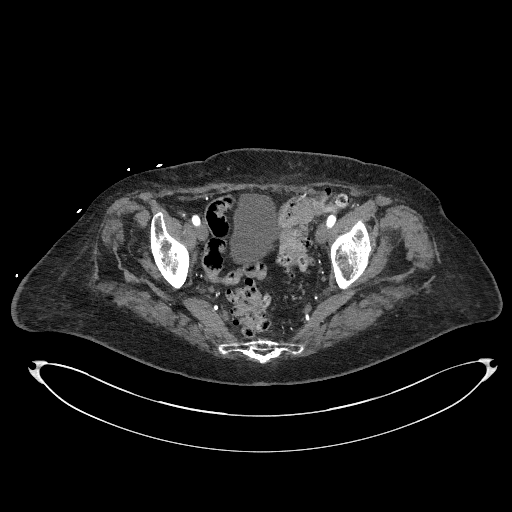
[im 91/212  soft-tissue]
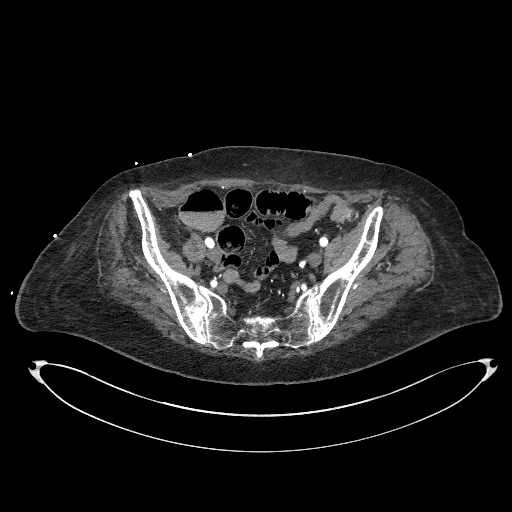
[im 121/212  soft-tissue]
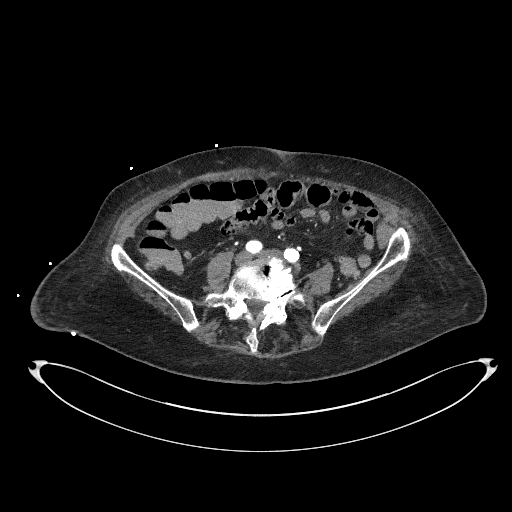

[Series 7: coronal mpr · coronal · 0.72mm/px · 2 of 125 slices shown, 3 images]
[im 42/125  soft-tissue]
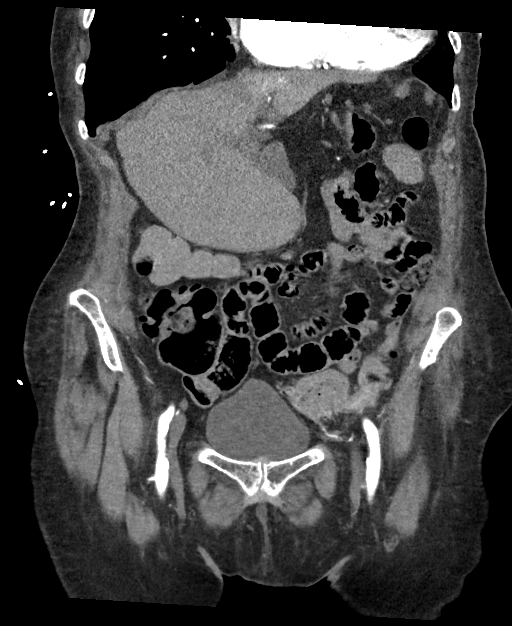
[im 42/125  bone]
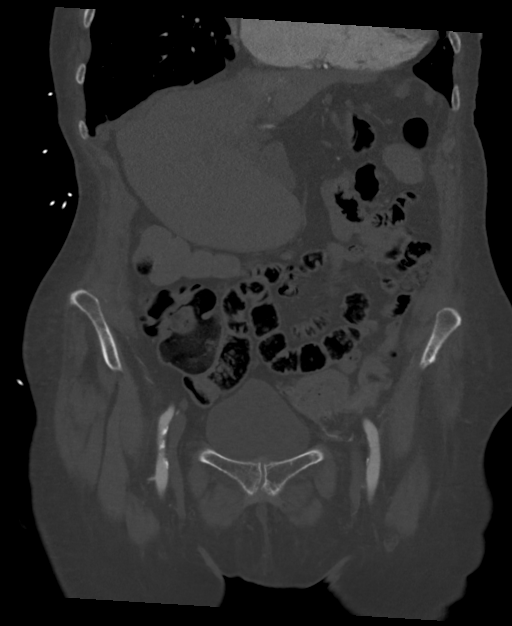
[im 83/125  soft-tissue]
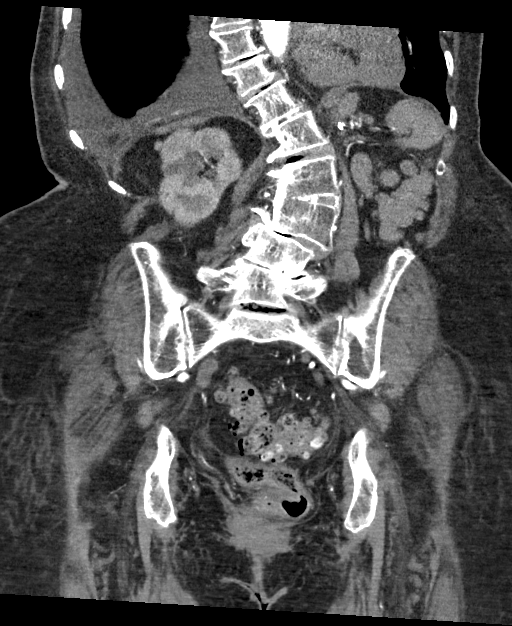

[Series 8: axial venous · axial · portal-venous · 0.96mm/px · z∈[-1173,-918]mm · 4 of 85 slices shown, 9 images]
[im 17/85  soft-tissue]
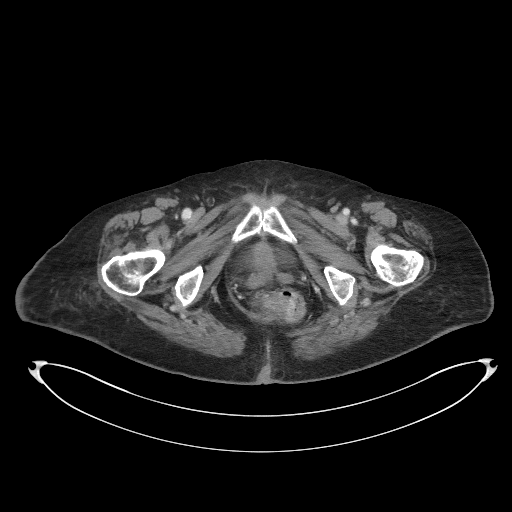
[im 17/85  lung]
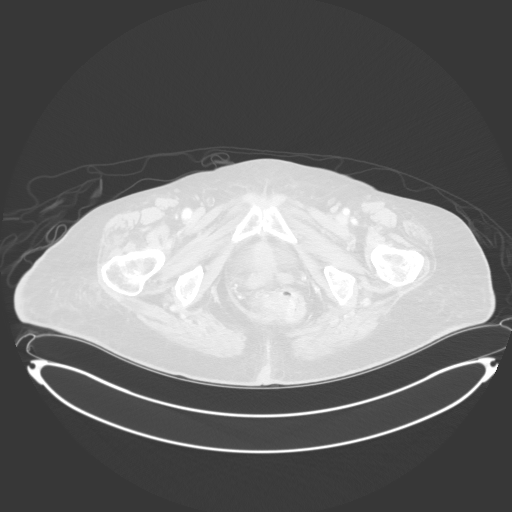
[im 17/85  bone]
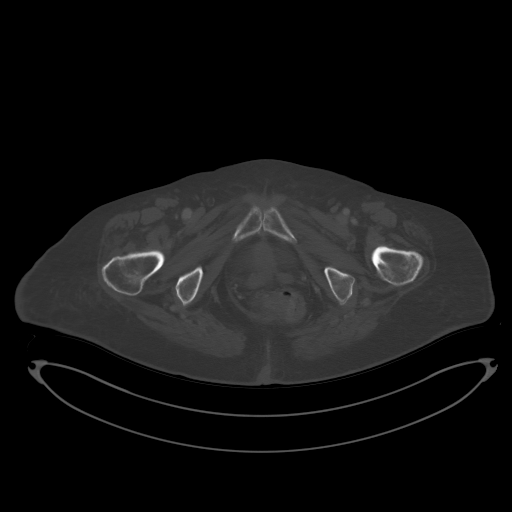
[im 34/85  soft-tissue]
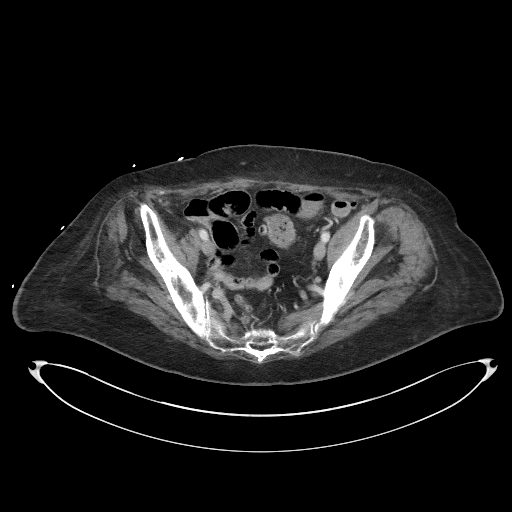
[im 34/85  lung]
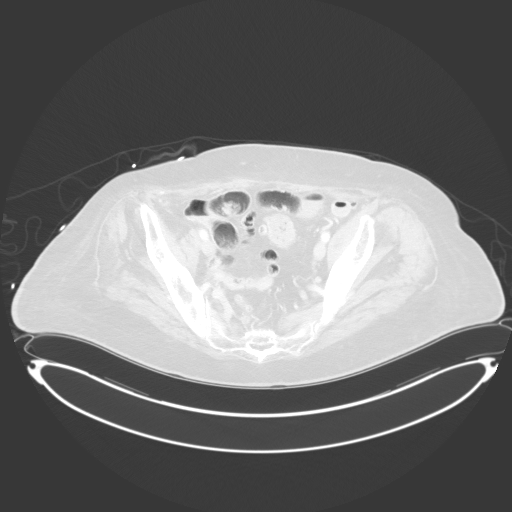
[im 51/85  soft-tissue]
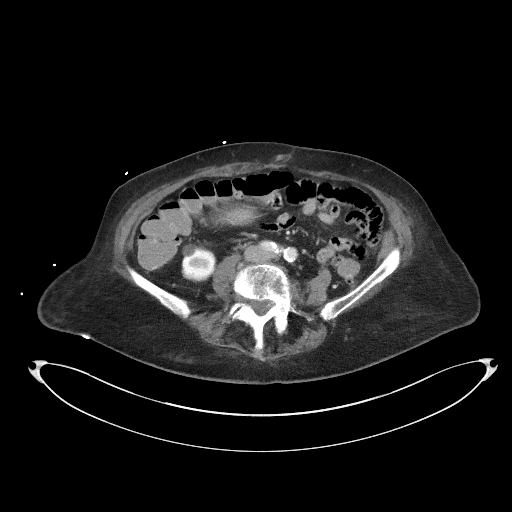
[im 51/85  lung]
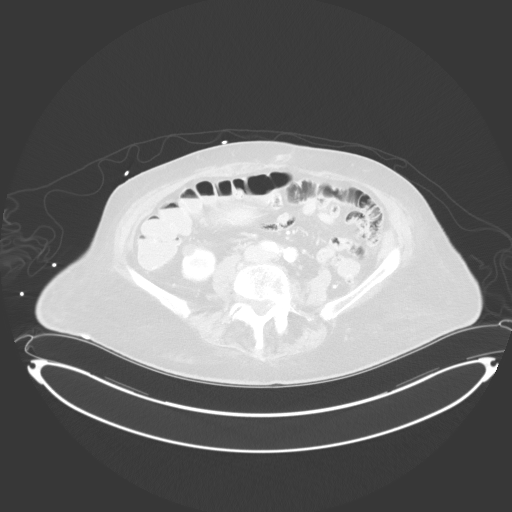
[im 68/85  soft-tissue]
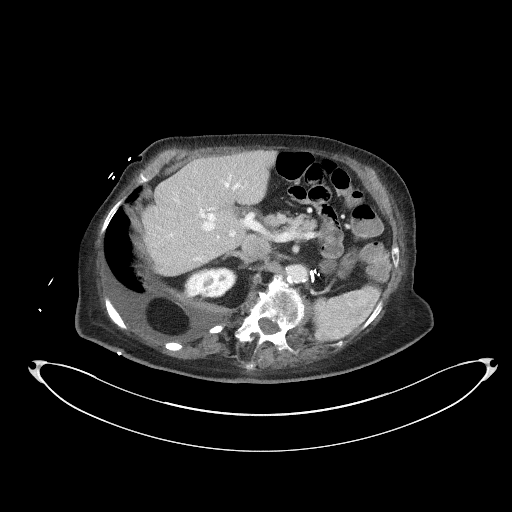
[im 68/85  lung]
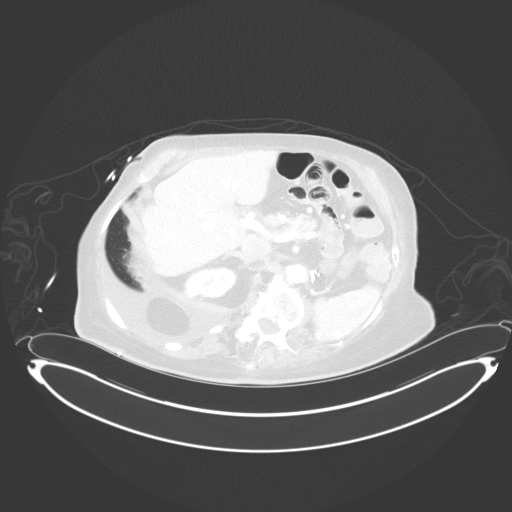

[11 of 46 positions shown; findings below may reference images not displayed]

FINDINGS: VASCULAR

Aorta: Moderate atherosclerotic calcification. No aneurysmal
dilatation or dissection. No periaortic fluid or hematoma. There is
a focal area of penetrating ulcer involving the right lateral wall
of the infrarenal abdominal aorta measuring approximately 7 mm in
depth.

Celiac: Atherosclerotic calcification of the origin of the celiac
axis. The celiac axis and its major branches are patent.

SMA: Patent without evidence of aneurysm, dissection, vasculitis or
significant stenosis.

Renals: Atherosclerotic calcification of the origin of the right
renal artery. The right renal artery remains patent.

IMA: Patent without evidence of aneurysm, dissection, vasculitis or
significant stenosis.

Inflow: Atherosclerotic calcification of the iliac arteries. No
aneurysmal dilatation or dissection.

Proximal Outflow: Bilateral common femoral and visualized portions
of the superficial and profunda femoral arteries are patent without
evidence of aneurysm, dissection, vasculitis or significant
stenosis.

Veins: No obvious venous abnormality within the limitations of this
arterial phase study.

Review of the MIP images confirms the above findings.

NON-VASCULAR

Lower chest: Partially visualized moderate right pleural effusion.

No intra-abdominal free air or free fluid.

Hepatobiliary: The liver is unremarkable. No intrahepatic biliary
dilatation. Multiple stones. No pericholecystic fluid or evidence of
acute cholecystitis by CT.

Pancreas: Indeterminate faint 11 mm hypodense lesion in the distal
pancreas which is not characterized on this CT, possibly a side
branch IPMN. No acute inflammatory changes. No dilatation of the
main pancreatic duct or gland atrophy.

Spleen: Normal in size without focal abnormality.

Adrenals/Urinary Tract: The right adrenal gland is unremarkable.
There is a solitary right kidney status post prior left nephrectomy.
No hydronephrosis. Several small right renal cysts. The urinary
bladder is grossly unremarkable.

Stomach/Bowel: Diffuse diverticulosis of the descending colon and
severe sigmoid diverticulosis without active inflammatory changes.
No bowel obstruction or active inflammation. There is a large hiatal
hernia. No extravasation of contrast to suggest active GI bleed.
Appendectomy.

Lymphatic: No adenopathy.

Reproductive: Hysterectomy.

Other: Faint 2.3 x 1.3 cm focal area of high attenuating prominence
in the right rectus muscle which may represent focal area of
muscular prominence versus a small hematoma (77/5).

Musculoskeletal: Osteopenia with degenerative changes of the spine
and scoliosis. No acute osseous pathology.
IMPRESSION: 1. No evidence of active GI bleed.
2. Severe colonic diverticulosis without active inflammatory
changes.
3. Large hiatal hernia.
4. Cholelithiasis.
5. Solitary right kidney.
6. Partially visualized moderate right pleural effusion.

## 2022-12-04 IMAGING — DX DG CHEST 1V PORT
1 series · 1 of 1 positions shown · non-contrast
Comparison: 12/16/2020

CLINICAL DATA: Weakness and generalized fatigue.

EXAM:
PORTABLE CHEST 1 VIEW

[chest ap]
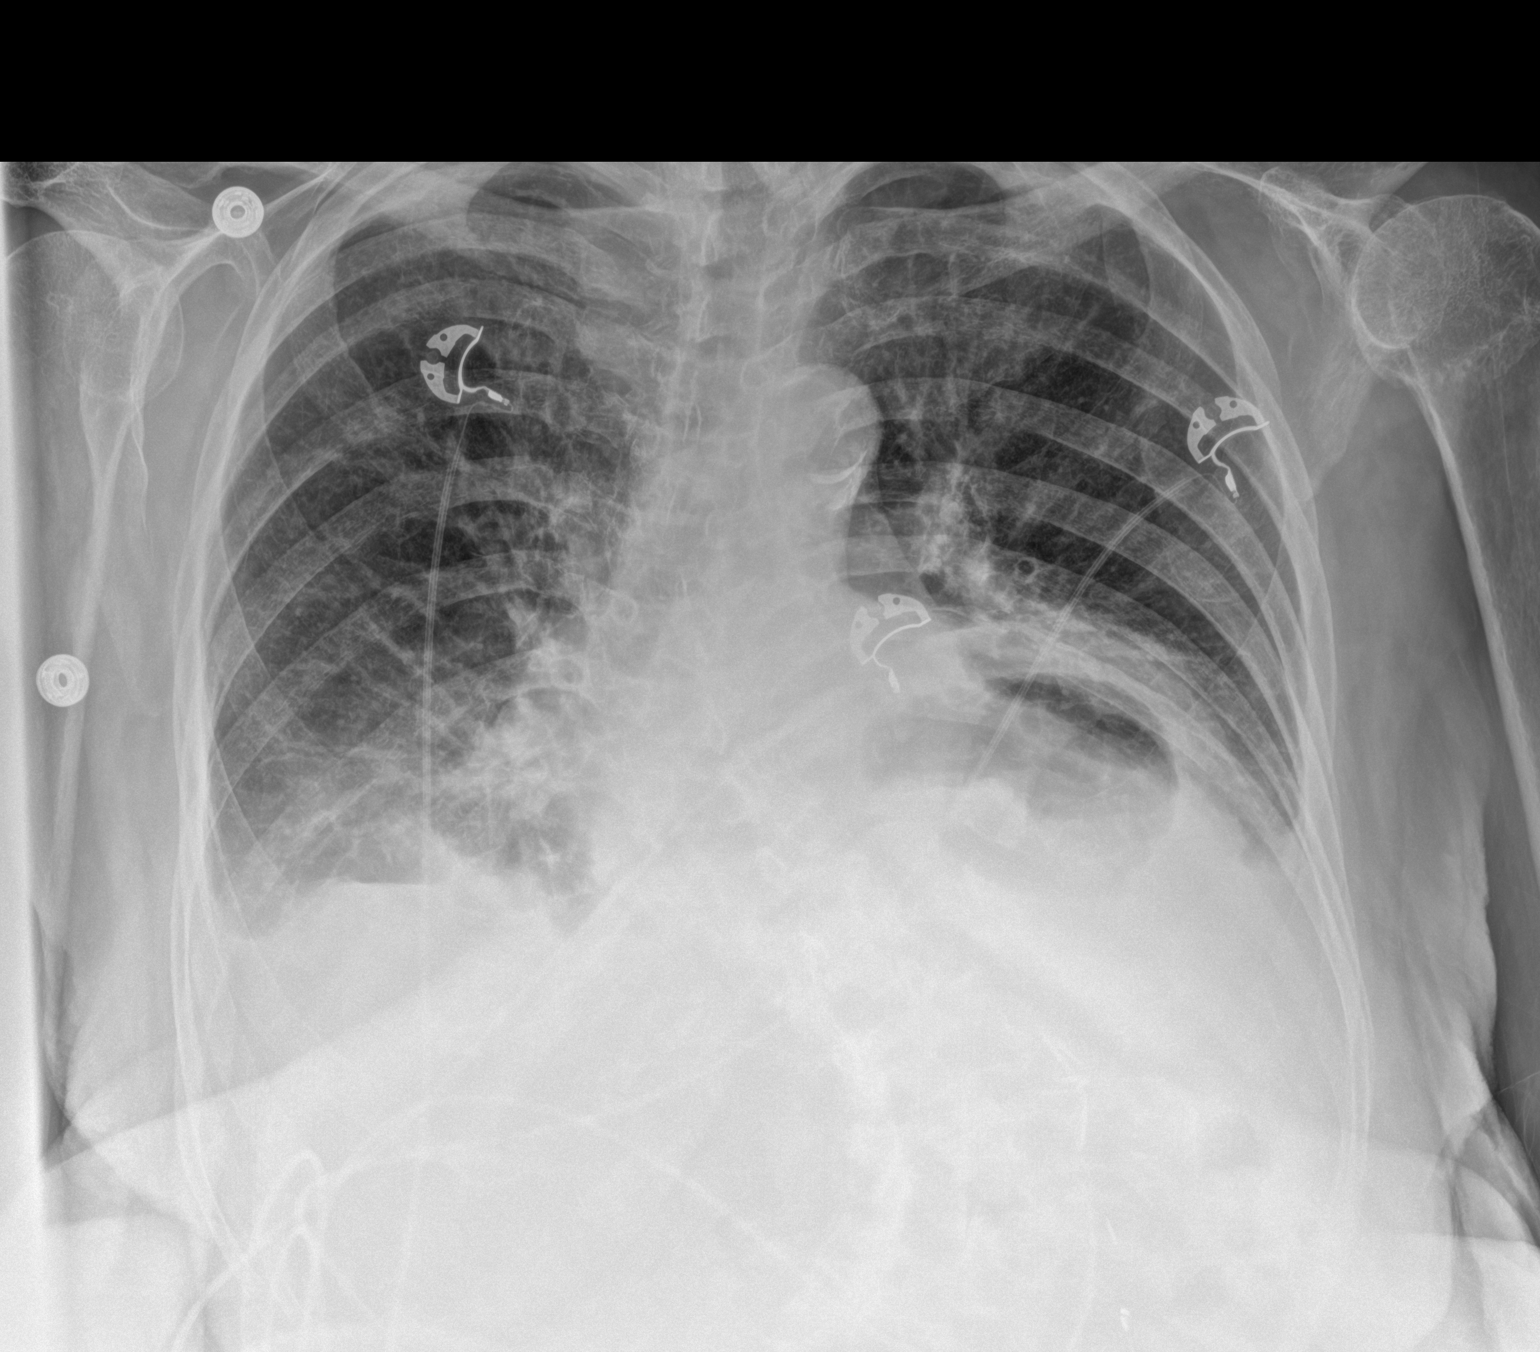

[1 of 1 positions shown; findings below may reference images not displayed]

FINDINGS: Again noted are basilar densities suggestive for pleural effusions.
Improved aeration at the lung bases compared to the previous
examination. Upper lungs are clear without pulmonary edema. Again
noted is severe scoliosis in the thoracolumbar spine. Heart size is
grossly stable.
IMPRESSION: Persistent basilar chest densities are most compatible with pleural
effusions and compressive atelectasis. Overall, slightly improved
aeration at the lung bases compared to 12/16/2020.
